# Patient Record
Sex: Male | Born: 1945 | Race: Black or African American | Hispanic: No | Marital: Married | Smoking: Former smoker
Health system: Southern US, Community
[De-identification: ages and names within clinical notes are randomized; demographics above are authoritative.]

## PROBLEM LIST (undated history)

## (undated) DIAGNOSIS — E119 Type 2 diabetes mellitus without complications: Secondary | ICD-10-CM

## (undated) DIAGNOSIS — K635 Polyp of colon: Secondary | ICD-10-CM

## (undated) DIAGNOSIS — K5730 Diverticulosis of large intestine without perforation or abscess without bleeding: Secondary | ICD-10-CM

## (undated) DIAGNOSIS — E669 Obesity, unspecified: Secondary | ICD-10-CM

## (undated) DIAGNOSIS — N529 Male erectile dysfunction, unspecified: Secondary | ICD-10-CM

## (undated) DIAGNOSIS — M1990 Unspecified osteoarthritis, unspecified site: Secondary | ICD-10-CM

## (undated) DIAGNOSIS — I4891 Unspecified atrial fibrillation: Secondary | ICD-10-CM

## (undated) DIAGNOSIS — I10 Essential (primary) hypertension: Secondary | ICD-10-CM

## (undated) DIAGNOSIS — E785 Hyperlipidemia, unspecified: Secondary | ICD-10-CM

## (undated) HISTORY — DX: Obesity, unspecified: E66.9

## (undated) HISTORY — DX: Diverticulosis of large intestine without perforation or abscess without bleeding: K57.30

## (undated) HISTORY — DX: Type 2 diabetes mellitus without complications: E11.9

## (undated) HISTORY — PX: PROSTATE BIOPSY: SHX241

## (undated) HISTORY — DX: Unspecified atrial fibrillation: I48.91

## (undated) HISTORY — DX: Male erectile dysfunction, unspecified: N52.9

## (undated) HISTORY — DX: Essential (primary) hypertension: I10

## (undated) HISTORY — DX: Polyp of colon: K63.5

## (undated) HISTORY — PX: HERNIA REPAIR: SHX51

## (undated) HISTORY — DX: Hyperlipidemia, unspecified: E78.5

---

## 2000-12-27 HISTORY — PX: OTHER SURGICAL HISTORY: SHX169

## 2002-12-27 HISTORY — PX: COLONOSCOPY: SHX174

## 2003-01-07 ENCOUNTER — Ambulatory Visit (HOSPITAL_COMMUNITY): Admission: RE | Admit: 2003-01-07 | Discharge: 2003-01-07 | Payer: Self-pay | Admitting: Internal Medicine

## 2003-01-09 ENCOUNTER — Ambulatory Visit (HOSPITAL_COMMUNITY): Admission: RE | Admit: 2003-01-09 | Discharge: 2003-01-09 | Payer: Self-pay | Admitting: Internal Medicine

## 2003-01-09 ENCOUNTER — Encounter: Payer: Self-pay | Admitting: Internal Medicine

## 2003-06-04 ENCOUNTER — Encounter: Payer: Self-pay | Admitting: *Deleted

## 2003-06-04 ENCOUNTER — Encounter (HOSPITAL_COMMUNITY): Admission: RE | Admit: 2003-06-04 | Discharge: 2003-07-04 | Payer: Self-pay | Admitting: *Deleted

## 2003-06-10 ENCOUNTER — Ambulatory Visit (HOSPITAL_COMMUNITY): Admission: RE | Admit: 2003-06-10 | Discharge: 2003-06-10 | Payer: Self-pay | Admitting: *Deleted

## 2003-06-18 ENCOUNTER — Ambulatory Visit (HOSPITAL_COMMUNITY): Admission: RE | Admit: 2003-06-18 | Discharge: 2003-06-18 | Payer: Self-pay | Admitting: Cardiology

## 2004-06-05 ENCOUNTER — Ambulatory Visit (HOSPITAL_COMMUNITY): Admission: RE | Admit: 2004-06-05 | Discharge: 2004-06-05 | Payer: Self-pay | Admitting: General Surgery

## 2005-02-19 ENCOUNTER — Ambulatory Visit: Payer: Self-pay | Admitting: *Deleted

## 2005-02-19 ENCOUNTER — Encounter: Payer: Self-pay | Admitting: Family Medicine

## 2008-10-21 ENCOUNTER — Ambulatory Visit: Payer: Self-pay | Admitting: Family Medicine

## 2008-10-21 DIAGNOSIS — M109 Gout, unspecified: Secondary | ICD-10-CM | POA: Insufficient documentation

## 2008-10-21 DIAGNOSIS — R7303 Prediabetes: Secondary | ICD-10-CM

## 2008-10-21 DIAGNOSIS — I10 Essential (primary) hypertension: Secondary | ICD-10-CM | POA: Insufficient documentation

## 2008-10-21 LAB — CONVERTED CEMR LAB
Glucose, Bld: 154 mg/dL
Hgb A1c MFr Bld: 6.2 %

## 2008-10-23 ENCOUNTER — Encounter: Payer: Self-pay | Admitting: Family Medicine

## 2008-10-25 LAB — CONVERTED CEMR LAB
ALT: 22 units/L (ref 0–53)
AST: 22 units/L (ref 0–37)
Albumin: 4.4 g/dL (ref 3.5–5.2)
Alkaline Phosphatase: 95 units/L (ref 39–117)
BUN: 13 mg/dL (ref 6–23)
Basophils Absolute: 0 10*3/uL (ref 0.0–0.1)
Basophils Relative: 0 % (ref 0–1)
Bilirubin, Direct: 0.1 mg/dL (ref 0.0–0.3)
CO2: 24 meq/L (ref 19–32)
Calcium: 9.8 mg/dL (ref 8.4–10.5)
Chloride: 105 meq/L (ref 96–112)
Cholesterol: 194 mg/dL (ref 0–200)
Creatinine, Ser: 1.21 mg/dL (ref 0.40–1.50)
Creatinine, Urine: 75.3 mg/dL
Eosinophils Absolute: 0.1 10*3/uL (ref 0.0–0.7)
Eosinophils Relative: 2 % (ref 0–5)
Glucose, Bld: 101 mg/dL — ABNORMAL HIGH (ref 70–99)
HCT: 42.9 % (ref 39.0–52.0)
HDL: 53 mg/dL (ref >39)
Hemoglobin: 14 g/dL (ref 13.0–17.0)
Indirect Bilirubin: 0.5 mg/dL (ref 0.0–0.9)
LDL Cholesterol: 121 mg/dL — ABNORMAL HIGH (ref 0–99)
Lymphocytes Relative: 25 % (ref 12–46)
Lymphs Abs: 1.8 10*3/uL (ref 0.7–4.0)
MCHC: 32.6 g/dL (ref 30.0–36.0)
MCV: 78.9 fL (ref 78.0–100.0)
Microalb Creat Ratio: 57.4 mg/g — ABNORMAL HIGH (ref 0.0–30.0)
Microalb, Ur: 4.32 mg/dL — ABNORMAL HIGH (ref 0.00–1.89)
Monocytes Absolute: 0.4 10*3/uL (ref 0.1–1.0)
Monocytes Relative: 6 % (ref 3–12)
Neutro Abs: 4.9 10*3/uL (ref 1.7–7.7)
Neutrophils Relative %: 68 % (ref 43–77)
PSA: 3.51 ng/mL (ref 0.10–4.00)
Platelets: 191 10*3/uL (ref 150–400)
Potassium: 4.7 meq/L (ref 3.5–5.3)
RBC: 5.44 M/uL (ref 4.22–5.81)
RDW: 15 % (ref 11.5–15.5)
Sodium: 140 meq/L (ref 135–145)
Total Bilirubin: 0.6 mg/dL (ref 0.3–1.2)
Total CHOL/HDL Ratio: 3.7
Total Protein: 7.4 g/dL (ref 6.0–8.3)
Triglycerides: 102 mg/dL (ref <150)
Uric Acid, Serum: 9 mg/dL — ABNORMAL HIGH (ref 4.0–7.8)
VLDL: 20 mg/dL (ref 0–40)
WBC: 7.2 10*3/uL (ref 4.0–10.5)

## 2009-04-14 ENCOUNTER — Emergency Department (HOSPITAL_COMMUNITY): Admission: EM | Admit: 2009-04-14 | Discharge: 2009-04-14 | Payer: Self-pay | Admitting: Emergency Medicine

## 2009-04-18 ENCOUNTER — Ambulatory Visit: Payer: Self-pay | Admitting: Family Medicine

## 2009-04-18 LAB — CONVERTED CEMR LAB
Blood Glucose, Fasting: 115 mg/dL
Hgb A1c MFr Bld: 6.3 %

## 2009-08-26 ENCOUNTER — Ambulatory Visit: Payer: Self-pay | Admitting: Family Medicine

## 2009-08-26 DIAGNOSIS — R5381 Other malaise: Secondary | ICD-10-CM | POA: Insufficient documentation

## 2009-08-26 DIAGNOSIS — R5383 Other fatigue: Secondary | ICD-10-CM

## 2009-08-26 DIAGNOSIS — E785 Hyperlipidemia, unspecified: Secondary | ICD-10-CM | POA: Insufficient documentation

## 2009-08-26 LAB — CONVERTED CEMR LAB
Glucose, Bld: 104 mg/dL
Hgb A1c MFr Bld: 6.5 %

## 2009-09-01 DIAGNOSIS — N529 Male erectile dysfunction, unspecified: Secondary | ICD-10-CM

## 2009-09-01 HISTORY — DX: Male erectile dysfunction, unspecified: N52.9

## 2009-12-08 ENCOUNTER — Encounter (INDEPENDENT_AMBULATORY_CARE_PROVIDER_SITE_OTHER): Payer: Self-pay | Admitting: *Deleted

## 2010-03-26 ENCOUNTER — Ambulatory Visit: Payer: Self-pay | Admitting: Family Medicine

## 2010-03-27 ENCOUNTER — Encounter: Payer: Self-pay | Admitting: Family Medicine

## 2010-03-31 ENCOUNTER — Telehealth: Payer: Self-pay | Admitting: Family Medicine

## 2010-04-01 ENCOUNTER — Encounter: Payer: Self-pay | Admitting: Family Medicine

## 2010-04-06 ENCOUNTER — Telehealth: Payer: Self-pay | Admitting: Family Medicine

## 2010-04-06 LAB — CONVERTED CEMR LAB
ALT: 19 units/L (ref 0–53)
AST: 15 units/L (ref 0–37)
BUN: 15 mg/dL (ref 6–23)
Basophils Absolute: 0 10*3/uL (ref 0.0–0.1)
Basophils Relative: 0 % (ref 0–1)
CO2: 26 meq/L (ref 19–32)
Calcium: 9.7 mg/dL (ref 8.4–10.5)
Chloride: 103 meq/L (ref 96–112)
Cholesterol: 198 mg/dL (ref 0–200)
Creatinine, Ser: 1.17 mg/dL (ref 0.40–1.50)
Eosinophils Absolute: 0.2 10*3/uL (ref 0.0–0.7)
Eosinophils Relative: 2 % (ref 0–5)
Glucose, Bld: 104 mg/dL — ABNORMAL HIGH (ref 70–99)
HCT: 40.4 % (ref 39.0–52.0)
HDL: 46 mg/dL (ref >39)
Hemoglobin: 13.3 g/dL (ref 13.0–17.0)
Hgb A1c MFr Bld: 6.3 % — ABNORMAL HIGH (ref 4.6–6.1)
LDL Cholesterol: 122 mg/dL — ABNORMAL HIGH (ref 0–99)
Lymphocytes Relative: 24 % (ref 12–46)
Lymphs Abs: 1.8 10*3/uL (ref 0.7–4.0)
MCHC: 32.9 g/dL (ref 30.0–36.0)
MCV: 78.3 fL (ref 78.0–100.0)
Monocytes Absolute: 0.4 10*3/uL (ref 0.1–1.0)
Monocytes Relative: 5 % (ref 3–12)
Neutro Abs: 5 10*3/uL (ref 1.7–7.7)
Neutrophils Relative %: 68 % (ref 43–77)
PSA: 5.99 ng/mL — ABNORMAL HIGH (ref 0.10–4.00)
Platelets: 229 10*3/uL (ref 150–400)
Potassium: 4.7 meq/L (ref 3.5–5.3)
RBC: 5.16 M/uL (ref 4.22–5.81)
RDW: 15 % (ref 11.5–15.5)
Sodium: 139 meq/L (ref 135–145)
Total CHOL/HDL Ratio: 4.3
Triglycerides: 152 mg/dL — ABNORMAL HIGH (ref <150)
Uric Acid, Serum: 6.7 mg/dL (ref 4.0–7.8)
VLDL: 30 mg/dL (ref 0–40)
WBC: 7.4 10*3/uL (ref 4.0–10.5)

## 2010-07-16 ENCOUNTER — Telehealth: Payer: Self-pay | Admitting: Family Medicine

## 2010-09-14 ENCOUNTER — Ambulatory Visit: Payer: Self-pay | Admitting: Family Medicine

## 2010-10-01 ENCOUNTER — Telehealth: Payer: Self-pay | Admitting: Family Medicine

## 2010-12-09 ENCOUNTER — Encounter: Payer: Self-pay | Admitting: Family Medicine

## 2010-12-11 ENCOUNTER — Telehealth: Payer: Self-pay | Admitting: Family Medicine

## 2010-12-17 ENCOUNTER — Telehealth: Payer: Self-pay | Admitting: Family Medicine

## 2011-01-26 NOTE — Progress Notes (Signed)
Summary: RESULTS  Phone Note Call from Patient   Summary of Call: CALL BACK AT CE[facility] # [id number] RESULTS  [facility] # [id number] Initial call taken by: Lind Guest,  April 06, 2010 9:17 AM  Follow-up for Phone Call        returned call, left message, both # Follow-up by: Adella Hare LPN,  April 06, 2010 9:34 AM  Additional Follow-up for Phone Call Additional follow up Details #1::        patient aware Additional Follow-up by: Adella Hare LPN,  April 06, 2010 9:57 AM

## 2011-01-26 NOTE — Letter (Signed)
Summary: Letter  Letter   Imported By: Lind Guest 04/01/2010 15:00:47  _____________________________________________________________________  External Attachment:    Type:   Image     Comment:   External Document

## 2011-01-26 NOTE — Progress Notes (Signed)
Summary: refill   Phone Note Call from Patient   Summary of Call: pt needs a refill on medicine and needs to speak with nurse [phone]  cell [phone] Initial call taken by: Rudene Anda,  July 16, 2010 10:15 AM  Follow-up for Phone Call        returned call, left message Follow-up by: Adella Hare LPN,  July 16, 2010 11:55 AM  Additional Follow-up for Phone Call Additional follow up Details #1::        wants refill of indomethacin states #15 will not be enough  [facility]    Additional Follow-up by: Adella Hare LPN,  July 16, 2010 11:58 AM    Additional Follow-up for Phone Call Additional follow up Details #2::    pls keep the directions the same and inc the3 to 30, thanks  Follow-up by: Syliva Overman MD,  July 16, 2010 12:00 PM  Additional Follow-up for Phone Call Additional follow up Details #3:: Details for Additional Follow-up Action Taken: rx sent, called patient, no answer Additional Follow-up by: Adella Hare LPN,  July 16, 2010 4:36 PM  Prescriptions: INDOMETHACIN 50 MG CAPS (INDOMETHACIN) one capsule three times daily x 2 days, then one twice daily for 2 days thn one daily for 5 days  #30 x 0   Entered by:   Adella Hare LPN   Authorized by:   Syliva Overman MD   Signed by:   Adella Hare LPN on 21/30/8657   Method used:   Electronically to        [facility]  [address] (retail)       [address]       [place], [place]  [address]       Ph: [phone]       Fax: [phone]   RxID:   [id number]

## 2011-01-26 NOTE — Progress Notes (Signed)
Summary: referral  Phone Note Other Incoming   Caller: dr Pharoah Goggins Summary of Call: pt needs to be refered to be referred to urology eval and treat elevated PSA asap, pls fax the psa report just obtained last week, this is very impt Initial call taken by: Syliva Overman MD,  March 31, 2010 12:10 AM  Follow-up for Phone Call        pt has appt at dr. Jerre Simon office for 04/15/2010 3:00. pt number has been disconnected will send pt letter Follow-up by: Rudene Anda,  April 01, 2010 1:52 PM

## 2011-01-26 NOTE — Assessment & Plan Note (Signed)
Summary: per dr diabetic   Vital Signs:  Patient profile:   65 year old male Height:      74 inches Weight:      273 pounds BMI:     35.18 Pulse rate:   94 / minute Pulse rhythm:   regular Resp:     16 per minute BP sitting:   130 / 74  (left arm) Cuff size:   large  Vitals Entered By: Everitt Amber LPN (September 14, 2010 10:55 AM)  Nutrition Counseling: Patient's BMI is greater than 25 and therefore counseled on weight management options. CC: FOLLOW UP CHRONIC PROBLEMS    CC:  FOLLOW UP CHRONIC PROBLEMS .  History of Present Illness: Reports  thathe has been  doing well. Denies recent fever or chills. Denies sinus pressure, nasal congestion , ear pain or sore throat. Denies chest congestion, or cough productive of sputum. Denies chest pain, palpitations, PND, orthopnea or leg swelling. Denies abdominal pain, nausea, vomitting, diarrhea or constipation. Denies change in bowel movements or bloody stool. Denies dysuria , frequency, incontinence or hesitancy. Denies  joint pain, swelling, or reduced mobility. Denies headaches, vertigo, seizures. Denies depression, anxiety or insomnia. Denies  rash, lesions, or itch. He is not consistently taking his diabetic med, and is asymptomatic. [facility] is [facility]     Current Medications (verified): 1)  Allopurinol 300 Mg Tabs (Allopurinol) .... One Tab By Mouth Qd 2)  Metformin Hcl 500 Mg Tabs (Metformin Hcl) .... Take 1 Tablet By Mouth Once A Day 3)  Lotensin Hct 20-12.5 Mg Tabs (Benazepril-Hydrochlorothiazide) .... 2 Tablets Daily 4)  Levitra 20 Mg Tabs (Vardenafil Hcl) .... One Tab By Mouth Once Daily As Needed 30 Minutes Before Intercourse 5)  Lovastatin 40 Mg Tabs (Lovastatin) .... Take 1 Tab By Mouth At Bedtime 6)  Tamsulosin Hcl 0.4 Mg Caps (Tamsulosin Hcl) .... Take 1 Tablet By Mouth Once A Day  Allergies (verified): No Known Drug Allergies  Review of Systems      See HPI General:   Complains of fatigue. Eyes:  Denies blurring and discharge. Endo:  Denies excessive thirst and heat intolerance. Heme:  Denies abnormal bruising and bleeding. Allergy:  Denies hives or rash and itching eyes.  Physical Exam  General:  Well-developed,obese,in no acute distress; alert,appropriate and cooperative throughout examination HEENT: No facial asymmetry,  EOMI, No sinus tenderness, TM's Clear, oropharynx  pink and moist.   Chest: Clear to auscultation bilaterally.  CVS: S1, S2, No murmurs, No S3.   Abd: Soft, Nontender.  MS: Adequate ROM spine, hips, shoulders and knees.  Ext: No edema.   CNS: CN 2-12 intact, power tone and sensation normal throughout.   Skin: Intact, no visible lesions or rashes.  Psych: Good eye contact, normal affect.  Memory intact, not anxious or depressed appearing.    Impression & Recommendations:  Problem # 1:  OBESITY (ICD-278.00) Assessment Unchanged  Ht: 74 (09/14/2010)   Wt: 273 (09/14/2010)   BMI: 35.18 (09/14/2010) therapeutic lifestyle change discussed and encouraged  Problem # 2:  HYPERLIPIDEMIA (ICD-272.4) Assessment: Comment Only  His updated medication list for this problem includes:    Lovastatin 40 Mg Tabs (Lovastatin) .Marland Kitchen... Take 1 tab by mouth at bedtime Low fat diet discussed and encouraged, and literature also given   Labs Reviewed: SGOT: 15 (03/27/2010)   SGPT: 19 (03/27/2010)   HDL:46 (03/27/2010), 53 (10/23/2008)  LDL:122 (03/27/2010), 121 (10/23/2008)  Chol:198 (03/27/2010), 194 (10/23/2008)  Trig:152 (03/27/2010),  102 (10/23/2008)  Problem # 3:  DIABETES MELLITUS, TYPE II (ICD-250.00) Assessment: Comment Only  His updated medication list for this problem includes:    Metformin Hcl 500 Mg Tabs (Metformin hcl) .Marland Kitchen... Take 1 tablet by mouth once a day    Lotensin Hct 20-12.5 Mg Tabs (Benazepril-hydrochlorothiazide) .Marland Kitchen... 2 tablets daily Patient advised to reduce carbs and sweets, commit to regular physical activity, take  meds as prescribed, test blood sugars as directed, and attempt to lose weight , to improve blood sugar control.  Orders: T- Hemoglobin A1C ([id number]) T-Urine Microalbumin w/creat. ratio [phone])  Labs Reviewed: Creat: 1.17 (03/27/2010)    Reviewed HgBA1c results: 6.3 (03/27/2010)  6.5 (08/26/2009)  Complete Medication List: 1)  Allopurinol 300 Mg Tabs (Allopurinol) .... One tab by mouth qd 2)  Metformin Hcl 500 Mg Tabs (Metformin hcl) .... Take 1 tablet by mouth once a day 3)  Lotensin Hct 20-12.5 Mg Tabs (Benazepril-hydrochlorothiazide) .... 2 tablets daily 4)  Levitra 20 Mg Tabs (Vardenafil hcl) .... One tab by mouth once daily as needed 30 minutes before intercourse 5)  Lovastatin 40 Mg Tabs (Lovastatin) .... Take 1 tab by mouth at bedtime 6)  Tamsulosin Hcl 0.4 Mg Caps (Tamsulosin hcl) .... Take 1 tablet by mouth once a day  Other Orders: T-Basic Metabolic Panel [phone]) T-Hepatic Function [phone]) T-Lipid Profile [phone]) T-TSH [phone]) T-Uric Acid (Blood) [phone])  Patient Instructions: 1)  Follow up appointment in 5.36months 2)  It is important that you exercise regularly at least 20 minutes 5 times a week. If you develop chest pain, have severe difficulty breathing, or feel very tired , stop exercising immediately and seek medical attention. 3)  You need to lose weight. Consider a lower calorie diet and regular exercise.  4)  BMP prior to visit, ICD-9: 5)  Hepatic Panel prior to visit, ICD-9: 6)  Lipid Panel prior to visit, ICD-9: 7)  TSH prior to visit, ICD-9:   fasting at th [place] asap 8)  HbgA1C prior to visit, ICD-9: 9)  Urine Microalbumin prior to visit, ICD-9: 10)  Uric acid 11)  no med changes

## 2011-01-26 NOTE — Assessment & Plan Note (Signed)
Summary: OV   Vital Signs:  Patient profile:   65 year old male Height:      74 inches Weight:      274.25 pounds BMI:     35.34 O2 Sat:      98 % Pulse rate:   114 / minute Pulse rhythm:   regular Resp:     16 per minute BP sitting:   148 / 94  (left arm) Cuff size:   large  Vitals Entered By: Everitt Amber LPN (March 26, 2010 10:19 AM)  Nutrition Counseling: Patient's BMI is greater than 25 and therefore counseled on weight management options. CC: Follow up chronic problems   CC:  Follow up chronic problems.  History of Present Illness: Reports  that he ahs been doing well. Denies recent fever or chills. Denies sinus pressure, nasal congestion , ear pain or sore throat. Denies chest congestion, or cough productive of sputum. Denies chest pain, palpitations, PND, orthopnea or leg swelling. Denies abdominal pain, nausea, vomitting, diarrhea or constipation. Denies change in bowel movements or bloody stool. Denies dysuria , frequency, incontinence or hesitancy. . Denies headaches, vertigo, seizures. Denies depression, anxiety or insomnia. Denies  rash, lesions, or itch.     Current Medications (verified): 1)  Indomethacin 50 Mg Caps (Indomethacin) .... Take One Cap Every 8 Hours With Food 2)  Allopurinol 300 Mg Tabs (Allopurinol) .... One Tab By Mouth Qd 3)  Metformin Hcl 500 Mg Tabs (Metformin Hcl) .... Take 1 Tablet By Mouth Once A Day 4)  Lotensin Hct 20-12.5 Mg Tabs (Benazepril-Hydrochlorothiazide) .... 2 Tablets Daily 5)  Levitra 20 Mg Tabs (Vardenafil Hcl) .... One Tab By Mouth Once Daily As Needed 30 Minutes Before Intercourse  Allergies (verified): No Known Drug Allergies  Review of Systems      See HPI CV:  forgot BP med yesterday. MS:  Complains of joint pain and stiffness; left ankle then right heel gout flare in the past 1 wek still symptomatic, firast tiime in 6 months. Endo:  Denies cold intolerance, excessive hunger, excessive thirst, excessive  urination, heat intolerance, polyuria, and weight change; are tested daily bnblood sugars avg 134. Heme:  Denies abnormal bruising and bleeding. Allergy:  Complains of seasonal allergies.  Physical Exam  General:  Well-developed,obese,in no acute distress; alert,appropriate and cooperative throughout examination HEENT: No facial asymmetry,  EOMI, No sinus tenderness, TM's Clear, oropharynx  pink and moist.   Chest: Clear to auscultation bilaterally.  CVS: S1, S2, No murmurs, No S3.   Abd: Soft, Nontender.  MS: Adequate ROM spine, hips, shoulders and knees.  Ext: No edema.   CNS: CN 2-12 intact, power tone and sensation normal throughout.   Skin: Intact, no visible lesions or rashes.  Psych: Good eye contact, normal affect.  Memory intact, not anxious or depressed appearing.    Impression & Recommendations:  Problem # 1:  HYPERLIPIDEMIA (ICD-272.4) Assessment Comment Only  The following medications were removed from the medication list:    Lovastatin 40 Mg Tabs (Lovastatin) .Marland Kitchen... Take 1 tab by mouth at bedtime His updated medication list for this problem includes:    Lovastatin 40 Mg Tabs (Lovastatin) .Marland Kitchen... Take 1 tab by mouth at bedtime  Orders: T-Lipid Profile ([id number]) T-AST/SGOT ([id number]) T-ALT/SGPT [phone])  Labs Reviewed: SGOT: 22 (10/23/2008)   SGPT: 22 (10/23/2008)   HDL:53 (10/23/2008)  LDL:121 (10/23/2008)  Chol:194 (10/23/2008)  Trig:102 (10/23/2008)  Problem # 2:  DIABETES MELLITUS, TYPE II (ICD-250.00) Assessment: Comment Only  His updated medication list  for this problem includes:    Metformin Hcl 500 Mg Tabs (Metformin hcl) .Marland Kitchen... Take 1 tablet by mouth once a day    Lotensin Hct 20-12.5 Mg Tabs (Benazepril-hydrochlorothiazide) .Marland Kitchen... 2 tablets daily  Orders: T- Hemoglobin A1C ([id number])  Labs Reviewed: Creat: 1.21 (10/23/2008)    Reviewed HgBA1c results: 6.5 (08/26/2009)  6.3 (04/18/2009)  Problem # 3:  OBESITY  (ICD-278.00) Assessment: Unchanged  Ht: 74 (03/26/2010)   Wt: 274.25 (03/26/2010)   BMI: 35.34 (03/26/2010)  Problem # 4:  HYPERTENSION (ICD-401.9) Assessment: Unchanged  His updated medication list for this problem includes:    Lotensin Hct 20-12.5 Mg Tabs (Benazepril-hydrochlorothiazide) .Marland Kitchen... 2 tablets daily  Orders: T-Basic Metabolic Panel [phone])  BP today: 148/94 Prior BP: 140/94 (08/26/2009)  Labs Reviewed: K+: 4.7 (10/23/2008) Creat: : 1.21 (10/23/2008)   Chol: 194 (10/23/2008)   HDL: 53 (10/23/2008)   LDL: 121 (10/23/2008)   TG: 102 (10/23/2008)  Problem # 5:  GOUT, UNSPECIFIED (ICD-274.9) Assessment: Deteriorated  His updated medication list for this problem includes:    Allopurinol 300 Mg Tabs (Allopurinol) ..... One tab by mouth qd  Orders: T-Uric Acid (Blood) ([id number])  Elevate extremity; warm compresses, symptomatic relief and medication as directed.   Complete Medication List: 1)  Allopurinol 300 Mg Tabs (Allopurinol) .... One tab by mouth qd 2)  Metformin Hcl 500 Mg Tabs (Metformin hcl) .... Take 1 tablet by mouth once a day 3)  Lotensin Hct 20-12.5 Mg Tabs (Benazepril-hydrochlorothiazide) .... 2 tablets daily 4)  Levitra 20 Mg Tabs (Vardenafil hcl) .... One tab by mouth once daily as needed 30 minutes before intercourse 5)  Indomethacin 50 Mg Caps (Indomethacin) .... One capsule three times daily x 2 days, then one twice daily for 2 days thn one daily for 5 days 6)  Lovastatin 40 Mg Tabs (Lovastatin) .... Take 1 tab by mouth at bedtime  Other Orders: T-CBC w/Diff ([id number]) T-PSA ([id number])  Patient Instructions: 1)  F/U in 5 months. 2)  It is important that you exercise regularly at least 20 minutes 5 times a week. If you develop chest pain, have severe difficulty breathing, or feel very tired , stop exercising immediately and seek medical attention. 3)  You need to lose weight. Consider a lower calorie diet and regular exercise.  4)   Labs asap, these are past due  5)  Meds are sent in for gout Prescriptions: LOVASTATIN 40 MG TABS (LOVASTATIN) Take 1 tab by mouth at bedtime  #30 x 3   Entered by:   Everitt Amber LPN   Authorized by:   Syliva Overman MD   Signed by:   Everitt Amber LPN on 02/72/5366   Method used:   Electronically to        [facility]  [address] (retail)       [address]       [place], [place]  [address]       Ph: [phone]       Fax: [phone]   RxID:   [id number] LOTENSIN HCT 20-12.5 MG TABS (BENAZEPRIL-HYDROCHLOROTHIAZIDE) 2 tablets daily  #60 x 5   Entered by:   Everitt Amber LPN   Authorized by:   Syliva Overman MD   Signed by:   Everitt Amber LPN on 23/55/7322   Method used:   Electronically to        [facility]  [address] (retail)       [address]  [place], [place]  [address]       Ph: [phone]       Fax: [phone]   RxID:   [id number] METFORMIN HCL 500 MG TABS (METFORMIN HCL) Take 1 tablet by mouth once a day  #30 x 4   Entered by:   Everitt Amber LPN   Authorized by:   Syliva Overman MD   Signed by:   Everitt Amber LPN on 40/09/2724   Method used:   Electronically to        [facility]  [address] (retail)       [address]       [place], [place]  [address]       Ph: [phone]       Fax: [phone]   RxID:   [id number] ALLOPURINOL 300 MG TABS (ALLOPURINOL) ONE TAB by mouth QD  #30 x 4   Entered by:   Everitt Amber LPN   Authorized by:   Syliva Overman MD   Signed by:   Everitt Amber LPN on 09/32/3557   Method used:   Electronically to        [facility]  [address] (retail)       [address]       [place], [place]  [address]       Ph: [phone]       Fax: [phone]   RxID:   [id number] INDOMETHACIN 50 MG CAPS (INDOMETHACIN) one capsule three times daily x 2 days, then one twice daily for 2 days thn one daily for 5 days  #15 x 1   Entered and Authorized by:   Syliva Overman MD   Signed by:   Syliva Overman MD on 03/26/2010   Method used:   Electronically to        [facility]  [address] (retail)       [address]       [place], [place]  [address]       Ph: [phone]       Fax: [phone]   RxID:   [id number]

## 2011-01-26 NOTE — Progress Notes (Signed)
Summary: refills  Phone Note Call from Patient   Summary of Call: needs a refill on diabetic pills sent into [facility].  [phone] Initial call taken by: Rudene Anda,  October 01, 2010 11:17 AM    Prescriptions: METFORMIN HCL 500 MG TABS (METFORMIN HCL) Take 1 tablet by mouth once a day  #30 x 3   Entered by:   Everitt Amber LPN   Authorized by:   Syliva Overman MD   Signed by:   Everitt Amber LPN on 08/65/7846   Method used:   Electronically to        [facility] [phone]* (retail)       [address]       [place], [place]  [address]       Ph: [phone] or [phone]       Fax: [phone]   RxID:   [id number]

## 2011-01-28 NOTE — Progress Notes (Signed)
Summary: REFILL MEDICATION  Phone Note Call from Patient   Summary of Call: PATINET CLLED STATES THAT HE HAS LOST RX BOTTLE NEED REFILL ON BLOOD PRESSURE PILL PLEASE CALL INTO [facility]  Initial call taken by: Eugenio Hoes,  December 11, 2010 2:49 PM    Prescriptions: LOTENSIN HCT 20-12.5 MG TABS (BENAZEPRIL-HYDROCHLOROTHIAZIDE) 2 tablets daily  #60 x 2   Entered by:   Adella Hare LPN   Authorized by:   Syliva Overman MD   Signed by:   Adella Hare LPN on 16/09/9603   Method used:   Electronically to        [facility]  [address] (retail)       [address]       [place], [place]  [address]       Ph: [phone]       Fax: [phone]   RxID:   [id number]

## 2011-01-28 NOTE — Progress Notes (Signed)
Summary: RX REFILL   Phone Note Call from Patient   Summary of Call: PATIENT CALLED STATS THAT HE IS  OUT OF REFILL ON RX INDOMETHACIN 50 MG PHARMACY [facility] Initial call taken by: Eugenio Hoes,  December 17, 2010 9:45 AM  Follow-up for Phone Call        may i refill? Follow-up by: Adella Hare LPN,  December 17, 2010 9:56 AM  Additional Follow-up for Phone Call Additional follow up Details #1::        refillx 2 pls Additional Follow-up by: Syliva Overman MD,  December 17, 2010 11:25 AM    New/Updated Medications: INDOMETHACIN 50 MG CAPS (INDOMETHACIN) one capsule three times daily x 2 days, then one twice daily for 2 days thn one daily for 5 days Prescriptions: INDOMETHACIN 50 MG CAPS (INDOMETHACIN) one capsule three times daily x 2 days, then one twice daily for 2 days thn one daily for 5 days  #15 x 1   Entered by:   Adella Hare LPN   Authorized by:   Syliva Overman MD   Signed by:   Adella Hare LPN on 16/09/9603   Method used:   Electronically to        [facility]  [address] (retail)       [address]       [place], [place]  [address]       Ph: [phone]       Fax: [phone]   RxID:   [id number]

## 2011-01-28 NOTE — Letter (Signed)
Summary: Letter to [facility] for appt  Letter to [facility] for appt   Imported By: Lind Guest 12/09/2010 13:49:02  _____________________________________________________________________  External Attachment:    Type:   Image     Comment:   External Document

## 2011-03-10 ENCOUNTER — Encounter: Payer: Self-pay | Admitting: Family Medicine

## 2011-03-10 ENCOUNTER — Ambulatory Visit: Payer: Self-pay | Admitting: Family Medicine

## 2011-03-16 NOTE — Letter (Signed)
Summary: 1ST NO SHOW LETTER  1ST NO SHOW LETTER   Imported By: Lind Guest 03/11/2011 10:26:03  _____________________________________________________________________  External Attachment:    Type:   Image     Comment:   External Document

## 2011-05-03 ENCOUNTER — Telehealth: Payer: Self-pay | Admitting: Family Medicine

## 2011-05-03 MED ORDER — BENAZEPRIL-HYDROCHLOROTHIAZIDE 20-12.5 MG PO TABS: ORAL_TABLET | ORAL | Status: DC

## 2011-05-03 NOTE — Telephone Encounter (Signed)
Refills sent as requested

## 2011-05-14 NOTE — Op Note (Signed)
NAME:  Carlos Leon, Carlos Leon                         ACCOUNT NO.:  [id number]   MEDICAL RECORD NO.:  [id number]                   PATIENT TYPE:  AMB   LOCATION:  DAY                                  FACILITY:  [facility]   PHYSICIAN:  R. Roetta Sessions, M.D.              DATE OF BIRTH:  March 19, 1946   DATE OF PROCEDURE:  01/07/2003  DATE OF DISCHARGE:                                 OPERATIVE REPORT   PROCEDURE:  Surveillance colonoscopy.   INDICATIONS FOR PROCEDURE:  The patient is a 65 year old gentleman referred  by Tesfaye D. Felecia Shelling, M.D. for surveillance colonoscopy.  Apparently had a  colonoscopy some 5-6 years ago by Dr. Katrinka Blazing.  Polyps were removed.  He was  told to return in two years but has put it off until now.  He is devoid of  any lower GI tract symptoms.  Colonoscopy is now being done for  surveillance.  _________________ discussed with Mr. Raper at length.  Potential risks, benefits, and alternatives have been reviewed ASAT.   PROCEDURE NOTE:  O2 saturations and blood pressure, pulse, respirations were  monitored throughout the entire procedure.   CONSCIOUS SEDATION:  Versed 4 mg IV, Demerol 100 mg IV in divided doses.   INSTRUMENT:  Olympus video adult colonoscope.   FINDINGS:  Digital rectal exam revealed no abnormalities.   ENDOSCOPIC FINDINGS:  The prep was poor as described.   RECTUM:  Examination of the rectal mucosa under retroflexed view of the anal  verge revealed no abnormalities.   COLON:  Colonic mucosa was surveyed from the rectosigmoid junction through  the left transverse and right colon, to the area of the appendiceal orifice,  ileocecal valve, and cecum.  These structures were photographed.  The prep,  unfortunately, was poor.  There was formed and semiformed stool along the  way and vegetable matter which kept clogging the scope.  It was not possible  to irrigate and suction all of this material away, and consequently all of  the colonic mucosa was not  seen.  Grossly, however, there were no obvious  colonic mucosal abnormalities.  From the level of the cecum and ileocecal  valve, the scope was slowly withdrawn.  All previously mentioned mucosal  surfaces were again seen and, again, no abnormalities were observed.  The  patient tolerated the procedure well, was reacted in endoscopy.   IMPRESSION:  1. Normal rectum.  2. Grossly normal colon.  3. Poor prep precluded complete examination of the colon.    RECOMMENDATIONS:  Will complement the patient's study with an air contrast  barium enema in the near future.  Further recommendations to follow.                                               Jonathon Bellows,  M.D.    RMR/MEDQ  D:  01/07/2003  T:  01/07/2003  Job:  [id number]

## 2011-05-14 NOTE — Op Note (Signed)
Carlos Leon, Carlos Leon                         ACCOUNT NO.:  [id number]   MEDICAL RECORD NO.:  [id number]                   PATIENT TYPE:  AMB   LOCATION:  DAY                                  FACILITY:  [facility]   PHYSICIAN:  Jerolyn Shin C. Katrinka Blazing, M.D.                DATE OF BIRTH:  28-Mar-1946   DATE OF PROCEDURE:  DATE OF DISCHARGE:                                 OPERATIVE REPORT   PREOPERATIVE DIAGNOSIS:  Right inguinal hernia.   POSTOPERATIVE DIAGNOSIS:  Right indirect inguinal hernia.   PROCEDURE:  Right inguinal hernia repair.   SURGEON:  Dirk Dress. Katrinka Blazing, M.D.   DESCRIPTION:  Under spinal anesthesia the right inguinal area was prepped  and draped in a sterile field.  A curvilinear right lower inguinal incision  was made.  The excision extended down to the aponeurosis of the external  oblique.  The aponeurosis was opened in the line of its fibers.  The cord  was mobilized.  There were 2 large lipomas of the cord.  They were dissected  back to the internal ring, ligated and divided.  There was a large sac which  had covering of fat.  This was dissected back to the internal ring. It was  invaginated.  The area was plugged with a large mesh graft plug.  The plug  was sutured in with interrupted #0 Prolene.  An [place] patch was placed and  it was sutured with a running #0 Prolene.   The patient tolerated the procedure well.  The cord was placed in anatomic  position and the aponeurosis was closed with 3-0 Biosyn.  The subcutaneous  tissue was closed with 3-0 Biosyn.  The skin was closed with subcuticular 4-  0 Dexon. The patient tolerated the procedure well.  Local infiltration with  0.5% Marcaine with epinephrine was carried out for a total of 30 cc in the  subcutaneous tissue.  Dressings were placed.  He was transferred to a bed;  and taken to the postanesthetic care unit for further monitoring.      ___________________________________________    Dirk Dress. Katrinka Blazing, M.D.   LCS/MEDQ  D:  06/05/2004  T:  06/05/2004  Job:  [id number]   cc:   Tesfaye D. Felecia Shelling, M.D.  [address]  [place]  [place] [address]  Fax: [phone]

## 2011-05-14 NOTE — H&P (Signed)
NAMEARIEZ, Carlos Leon                         ACCOUNT NO.:  [id number]   MEDICAL RECORD NO.:  [id number]                   PATIENT TYPE:  AMB   LOCATION:  DAY                                  FACILITY:  [facility]   PHYSICIAN:  Jerolyn Shin C. Katrinka Blazing, M.D.                DATE OF BIRTH:  [birth year]   DATE OF ADMISSION:  DATE OF DISCHARGE:                                HISTORY & PHYSICAL   HISTORY OF PRESENT ILLNESS:  [age]-year-old male with history of heart  catheterization in July 2004.  He states that his vessels were clear.  He  noted some swelling on the side of the catheterization site for about 5  weeks.  It became more prominent while playing golf.  He noted bulging when  he would cough.  The patient was evaluated and was noted to have right  inguinal hernia.  He is symptomatic and is scheduled for right inguinal  hernia repair.   PAST HISTORY:  He has hypertension and diabetes mellitus.   MEDICATIONS:  1. Toprol-XL 50 mg daily.  2. Glucophage 500 mg daily.  3. Zocor 20 mg nightly.   FAMILY HISTORY:  Family history is positive for hypertension and diabetes.   SOCIAL HISTORY:  He is married, employed, does not drink, smoke or use  drugs.   PHYSICAL EXAMINATION:  VITAL SIGNS:  On exam, blood pressure 160/88, pulse  64, respirations 18, weight 272 pounds.  HEENT:  Unremarkable.  NECK:  Neck is supple without JVD or bruit.  CHEST:  Chest clear to auscultation.  HEART:  Heart regular rate and rhythm without murmur, gallop or rub.  ABDOMEN:  Abdomen is soft and nontender.  No masses.  Large reducible right  inguinal hernia.  EXTREMITIES:  No cyanosis, clubbing or edema.   IMPRESSION:  1. Right inguinal hernia.  2. Hypertension.  3. Diabetes mellitus.  4. History of colon polyps.   PLAN:  Right inguinal hernia repair.  He will need to have a colonoscopy for  followup of his colon polyps before he returns to work.     ___________________________________________                               Dirk Dress. Katrinka Blazing, M.D.   LCS/MEDQ  D:  06/04/2004  T:  06/05/2004  Job:  [id number]

## 2011-05-14 NOTE — Group Therapy Note (Signed)
   NAME:  Phill, Steck.:  [id number]   MEDICAL RECORD NO.:  [id number]                   PATIENT TYPE:   LOCATION:                                       FACILITY:   PHYSICIAN:  Vida Roller, M.D.                DATE OF BIRTH:  Mar 14, 1946   DATE OF PROCEDURE:  06/04/2003  DATE OF DISCHARGE:                                    STRESS TEST   EXERCISE CARDIOLITE:   INDICATION:  Mr. Fellman is a 65 year old male with no known coronary  artery disease who presented with atypical chest discomfort.  His cardiac  risk factors include tobacco abuse, diabetes mellitus, hyperlipidemia, and  age.   BASELINE DATA:  EKG shows atrial fibrillation and a ventricular response  rate of 75 beats per minute.  Blood pressure is 152/90.   PROCEDURE:  The patient exercised for a total of 6 minutes and 29 seconds to  boost protocol 3 and 7.0 net.  The test was stopped secondary to an elevated  blood pressure at 210/110.  The patient stated that he felt like he could  have gone a lot longer.  He denied any cardiopulmonary symptoms.   EKG showed no ischemic changes and few PVCs.  Maximal heart rate achieved  was 196 beats per minute, which is 120% of predicted maximum.  Maximum blood  pressure was 210/110, which resolved down to 169/90 in recovery.   Final images and results are pending M.D. review.     Amy Mercy Riding, P.A. [facility]                     Vida Roller, M.D.    AB/MEDQ  D:  06/04/2003  T:  06/04/2003  Job:  [id number]

## 2011-05-14 NOTE — Cardiovascular Report (Signed)
NAMEPARRISH, Carlos Leon                         ACCOUNT NO.:  [id number]   MEDICAL RECORD NO.:  [id number]                   PATIENT TYPE:  OIB   LOCATION:  2899                                 FACILITY:  [facility]   PHYSICIAN:  Charlies Constable, M.D.                  DATE OF BIRTH:  02-15-1946   DATE OF PROCEDURE:  06/18/2003  DATE OF DISCHARGE:                              CARDIAC CATHETERIZATION   CLINICAL HISTORY:  The patient is 65 years old and has hypertension and  atrial fibrillation.  He was recently seen by Jesse Sans. Wall, M.D. with  chest pain that was thought to be somewhat atypical for ischemia.  He  arranged for him to have a Cardiolite scan which suggested inferior scar  with questionable ischemia and arranged for him to come in for evaluation  with angiography.   PROCEDURE:  The procedure was performed via the right femoral artery  utilizing an arterial sheath and 6-French preformed coronary catheters.  A  front wall arterial puncture was performed and Omnipaque contrast was used.  A distal aortogram was performed to rule out renovascular causes for  hypertension.  The right femoral artery was closed with Perclose at the end  of the procedure.  The patient tolerated the procedure well and left the  laboratory in satisfactory condition.   RESULTS:  The aortic pressure was 146/102 with a mean of 123 and left  ventricular pressure was 146/20.   Left main coronary artery:  Free of significant disease.   Left anterior descending artery:  Gave rise to a large diagonal branch and a  large septal perforator.  These and the LAD proper were free of significant  disease.   Circumflex artery:  The circumflex artery gave rise to large marginal branch  and a small posterolateral branch.  There was some irregularity in these  vessels but they were free of significant disease.   Right coronary artery:  The right coronary artery is a moderate-size vessel  that gave rise to a conus  branch, right ventricular branch, a posterior  descending branch, and a small posterolateral branch.  There was no  significant lesions in the right coronary artery.   LEFT VENTRICULOGRAM:  The left ventriculogram performed in the RAO  projection showed good wall motion with no areas of hypokinesis.  The  estimated ejection fraction was 55%.   DISTAL AORTOGRAM:  A distal aortogram was performed which showed patent  renal arteries and no significant aortoiliac obstruction.   CONCLUSION:  Non-obstructive coronary artery disease with minimal  irregularities in the left anterior descending and circumflex artery and  normal left ventricular function.   RECOMMENDATIONS:  Reassurance.  Will plan to resume the patient's Coumadin  which he is taking for intermediate embolic risk with atrial fibrillation  associated with hypertensive disease.  Charlies Constable, M.D.    BB/MEDQ  D:  06/18/2003  T:  06/18/2003  Job:  [id number]  Jesse Sans. Wall, M.D.   Tesfaye D. Felecia Shelling, M.D.  [address]  [place]  [place] [address]  Fax: [phone]   cc:   Thomas C. Wall, M.D.   Tesfaye D. Felecia Shelling, M.D.  [address]  [place]  [place] [address]  Fax: [phone]

## 2011-05-28 ENCOUNTER — Encounter: Payer: Self-pay | Admitting: Family Medicine

## 2011-06-04 ENCOUNTER — Encounter: Payer: Self-pay | Admitting: Family Medicine

## 2011-06-08 ENCOUNTER — Ambulatory Visit (INDEPENDENT_AMBULATORY_CARE_PROVIDER_SITE_OTHER): Payer: Self-pay | Admitting: Family Medicine

## 2011-06-08 ENCOUNTER — Encounter: Payer: Self-pay | Admitting: Family Medicine

## 2011-06-08 VITALS — BP 140/100 | HR 67 | Resp 16 | Ht 74.5 in | Wt 271.1 lb

## 2011-06-08 DIAGNOSIS — E785 Hyperlipidemia, unspecified: Secondary | ICD-10-CM

## 2011-06-08 DIAGNOSIS — R5381 Other malaise: Secondary | ICD-10-CM

## 2011-06-08 DIAGNOSIS — I10 Essential (primary) hypertension: Secondary | ICD-10-CM

## 2011-06-08 DIAGNOSIS — E119 Type 2 diabetes mellitus without complications: Secondary | ICD-10-CM

## 2011-06-08 DIAGNOSIS — E669 Obesity, unspecified: Secondary | ICD-10-CM

## 2011-06-08 DIAGNOSIS — R5383 Other fatigue: Secondary | ICD-10-CM

## 2011-06-08 DIAGNOSIS — N529 Male erectile dysfunction, unspecified: Secondary | ICD-10-CM

## 2011-06-08 MED ORDER — AMLODIPINE BESYLATE 5 MG PO TABS: 5.0000 mg | ORAL_TABLET | Freq: Every day | ORAL | Status: DC

## 2011-06-08 NOTE — Progress Notes (Signed)
  Subjective:    Patient ID: Carlos Leon, male    DOB: [birth year], 65 y.o.   MRN: [id number]  HPI HYPERTENSION Disease Monitoring Blood pressure range-120 to 130/80 Chest pain- no      Dyspnea- no Medications Compliance- good Lightheadedness- no   Edema- no   DIABETES Disease Monitoring Blood Sugar ranges-124 to 128 fasting Polyuria- no New Visual problems- no Medications Compliance- n/a Hypoglycemic symptoms- no   HYPERLIPIDEMIA Disease Monitoring See symptoms for Hypertension Medications Compliance- no meds at this time RUQ pain- no  Muscle aches- no       Review of Systems Denies recent fever or chills.c/o fatigue, but really reports "boredom" with retirement, though he does have some routine involving golf and volunteering with [employer] activities  Denies sinus pressure, nasal congestion, ear pain or sore throat. Denies chest congestion, productive cough or wheezing. Denies chest pains, palpitations, paroxysmal nocturnal dyspnea, orthopnea and leg swelling Denies abdominal pain, nausea, vomiting,diarrhea or constipation.  Denies rectal bleeding or change in bowel movement. Denies dysuria, frequency, hesitancy or incontinence. Denies joint pain, swelling and limitation in mobility. Denies headaches, seizure, numbness, or tingling. Denies depression, anxiety or insomnia. Denies skin break down or rash.        Objective:   Physical Exam Patient alert and oriented and in no Cardiopulmonary distress.  HEENT: No facial asymmetry, EOMI, no sinus tenderness, TM's clear, Oropharynx pink and moist.  Neck supple no adenopathy.  Chest: Clear to auscultation bilaterally.  CVS: S1, S2 no murmurs, no S3.  ABD: Soft non tender. Bowel sounds normal.  Ext: No edema  MS: Adequate ROM spine, shoulders, hips and knees.  Skin: Intact, no ulcerations or rash noted.  Psych: Good eye contact, normal affect. Memory intact not anxious or depressed appearing.  CNS: CN 2-12  intact, power, tone and sensation normal throughout.        Assessment & Plan:

## 2011-06-08 NOTE — Patient Instructions (Signed)
F/u in 3.5 months  Your BP  Is too high, new additional medication, amlodipine take this at bedtime , same time every night.  A healthy diet is rich in fruit, vegetables and whole grains. Poultry fish, nuts and beans are a healthy choice for protein rather then red meat. A low sodium diet and drinking 64 ounces of water daily is generally recommended. Oils and sweet should be limited. Carbohydrates especially for those who are diabetic or overweight, should be limited to 34-45 gram per meal. It is important to eat on a regular schedule, at least 3 times daily. Snacks should be primarily fruits, vegetables or nuts.  It is important that you exercise regularly at least 30 minutes 5 times a week. If you develop chest pain, have severe difficulty breathing, or feel very tired, stop exercising immediately and seek medical attention   Fasting labs today pls.  Consider part time job, also increased volunteer activity

## 2011-06-08 NOTE — Assessment & Plan Note (Signed)
Medication compliance addressed. Commitment to regular exercise and healthy  food choices, with portion control discussed. DASH diet and low fat diet discussed and literature offered. Changes in medication made at this visit.  

## 2011-06-09 NOTE — Assessment & Plan Note (Signed)
Low fat diet encouraged, needs rept labs

## 2011-06-09 NOTE — Assessment & Plan Note (Signed)
Improved slightly, pt encouraged to commit to changes his diet to include more veg and fruit, and reduced calories

## 2011-06-09 NOTE — Assessment & Plan Note (Signed)
Diet only. On no med currently by choice, need HBA1C, fasting sugars reportedly are in the 120' s , which is high

## 2011-07-23 ENCOUNTER — Other Ambulatory Visit: Payer: Self-pay | Admitting: Family Medicine

## 2011-07-28 ENCOUNTER — Encounter: Payer: Self-pay | Admitting: Family Medicine

## 2011-07-28 ENCOUNTER — Ambulatory Visit (INDEPENDENT_AMBULATORY_CARE_PROVIDER_SITE_OTHER): Payer: Self-pay | Admitting: Family Medicine

## 2011-07-28 DIAGNOSIS — E119 Type 2 diabetes mellitus without complications: Secondary | ICD-10-CM

## 2011-07-28 DIAGNOSIS — I10 Essential (primary) hypertension: Secondary | ICD-10-CM

## 2011-07-28 DIAGNOSIS — E669 Obesity, unspecified: Secondary | ICD-10-CM

## 2011-07-28 MED ORDER — ALLOPURINOL 300 MG PO TABS: 300.0000 mg | ORAL_TABLET | Freq: Every day | ORAL | Status: DC

## 2011-07-28 NOTE — Patient Instructions (Signed)
Welcome to medicare visit in 3 months.  Blood pressure is good, pls get labs  Asap.  All the best

## 2011-08-02 ENCOUNTER — Telehealth: Payer: Self-pay | Admitting: Family Medicine

## 2011-08-02 DIAGNOSIS — E119 Type 2 diabetes mellitus without complications: Secondary | ICD-10-CM

## 2011-08-04 NOTE — Telephone Encounter (Signed)
Do you have any paperwork ready for this patient?

## 2011-08-06 NOTE — Telephone Encounter (Signed)
Called patient and left message. Hba1c ordered and will give to patient when he comes for UA

## 2011-08-06 NOTE — Telephone Encounter (Signed)
Spoke with pt pls call him on his mobile he need CCUa, and pls order just a hBA1C needs that too. He canhave the ccua in here, form will be complete when i have both results. He is expecting a call from you this afternoon

## 2011-08-08 NOTE — Assessment & Plan Note (Signed)
Controlled, no change in medication  

## 2011-08-08 NOTE — Assessment & Plan Note (Signed)
Deteriorated. Patient re-educated about  the importance of commitment to a  minimum of 150 minutes of exercise per week. The importance of healthy food choices with portion control discussed. Encouraged to start a food diary, count calories and to consider  joining a support group. Sample diet sheets offered. Goals set by the patient for the next several months.    

## 2011-08-08 NOTE — Progress Notes (Signed)
  Subjective:    Patient ID: Carlos Leon, male    DOB: 1946/09/30, 65 y.o.   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 He has upcoming medicare in September, he has no complaints. The exam will be modified to facilitate the need at this time. He denies polyuria, polydipsia or blurred vision, he does not test his sugars regulalrly  Review of Systems Denies recent fever or chills. Denies sinus pressure, nasal congestion, ear pain or sore throat. Denies chest congestion, productive cough or wheezing. Denies chest pains, palpitations and leg swelling Denies abdominal pain, nausea, vomiting,diarrhea or constipation.   Denies dysuria, frequency, hesitancy or incontinence. Denies joint pain, swelling and limitation in mobility. Denies headaches, seizures, numbness, or tingling. Denies depression, anxiety or insomnia. Denies skin break down or rash.        Objective:   Physical Exam Patient alert and oriented and in no cardiopulmonary distress.  HEENT: No facial asymmetry, EOMI, no sinus tenderness,  oropharynx pink and moist.  Neck supple no adenopathy. Hears at approx a 5 foot dsitance a forced whisper Chest: Clear to auscultation bilaterally.  CVS: S1, S2 no murmurs, no S3.  ABD: Soft non tender. Bowel sounds normal.  Ext: No edema  MS: Adequate ROM spine, shoulders, hips and knees.  Skin: Intact, no ulcerations or rash noted.  Psych: Good eye contact, normal affect. Memory intact not anxious or depressed appearing.  CNS: CN 2-12 intact, power, tone and sensation normal throughout.        Assessment & Plan:

## 2011-08-08 NOTE — Assessment & Plan Note (Signed)
Needs updated hBA1C before form can be signed off, has been controlled in the past

## 2011-08-09 NOTE — Telephone Encounter (Signed)
Called patient again on home and mobile, left message

## 2011-08-10 DIAGNOSIS — N390 Urinary tract infection, site not specified: Secondary | ICD-10-CM

## 2011-08-10 LAB — POCT URINALYSIS DIPSTICK
Glucose, UA: NEGATIVE
Ketones, UA: NEGATIVE
Leukocytes, UA: NEGATIVE
Nitrite, UA: NEGATIVE
pH, UA: 7

## 2011-08-10 NOTE — Telephone Encounter (Signed)
Spoke with patient and he is coming in today to leave the urine sample and to do his labs. I told him after we get that information we can get the form done. [place] Spaniel he needs it by the 21st

## 2011-08-10 NOTE — Progress Notes (Signed)
Addended by: Abner Greenspan on: 08/10/2011 11:34 AM   Modules accepted: Orders

## 2011-08-11 LAB — HEMOGLOBIN A1C: Mean Plasma Glucose: 148 mg/dL — ABNORMAL HIGH (ref <117)

## 2011-08-11 MED ORDER — METFORMIN HCL 500 MG PO TABS: 500.0000 mg | ORAL_TABLET | Freq: Two times a day (BID) | ORAL | Status: DC

## 2011-08-11 NOTE — Telephone Encounter (Signed)
Patient aware of results and that paper will be ready this pm. Med change sent to [facility]

## 2011-10-14 ENCOUNTER — Telehealth: Payer: Self-pay | Admitting: Family Medicine

## 2011-10-15 ENCOUNTER — Other Ambulatory Visit: Payer: Self-pay | Admitting: Family Medicine

## 2011-10-15 DIAGNOSIS — K625 Hemorrhage of anus and rectum: Secondary | ICD-10-CM

## 2011-10-15 NOTE — Telephone Encounter (Signed)
First episode of rectal blood yesterday1 teaspoon in the commode, no associated pain,, states he had a colonscopy about 6 years ago, reports he had polyps,  Pt advised if he has excessive recurrent rectal blood needs to go to the ED. In the interim he is referred to GI

## 2011-10-15 NOTE — Telephone Encounter (Signed)
pls refer pt to GI evaluate painless rectal bleeding

## 2011-10-15 NOTE — Telephone Encounter (Signed)
Pt was referred to dr. Jeanella Flattery office. They will call pt with appt and time. Pt is aware

## 2011-10-26 ENCOUNTER — Other Ambulatory Visit: Payer: Self-pay | Admitting: Family Medicine

## 2011-10-27 ENCOUNTER — Ambulatory Visit (INDEPENDENT_AMBULATORY_CARE_PROVIDER_SITE_OTHER): Payer: Medicare Other | Admitting: Gastroenterology

## 2011-10-27 ENCOUNTER — Encounter: Payer: Self-pay | Admitting: Family Medicine

## 2011-10-27 ENCOUNTER — Encounter: Payer: Self-pay | Admitting: Gastroenterology

## 2011-10-27 VITALS — BP 147/89 | HR 82 | Temp 99.0°F | Ht 75.0 in | Wt 277.8 lb

## 2011-10-27 DIAGNOSIS — Z860100 Personal history of colon polyps, unspecified: Secondary | ICD-10-CM

## 2011-10-27 DIAGNOSIS — D126 Benign neoplasm of colon, unspecified: Secondary | ICD-10-CM | POA: Insufficient documentation

## 2011-10-27 DIAGNOSIS — K625 Hemorrhage of anus and rectum: Secondary | ICD-10-CM | POA: Insufficient documentation

## 2011-10-27 NOTE — Progress Notes (Signed)
Primary Care Physician:  Syliva Overman, MD, MD  Primary Gastroenterologist:  Roetta Sessions, MD   Chief Complaint  Patient presents with  . Rectal Bleeding    HPI:  Carlos Leon is a 65 y.o. male here for further evaluation of recent single episode of small volume painless hematachezia. Denies any abdominal pain or rectal pain associated with the bleeding. Generally his bowel movements occur daily. Occasionally he has mild constipation, takes prn saline laxative by mouth. No abdominal pain. He has a history of diverticulosis and colon polyps. Occasional heartburn. No dysphagia, n/v, weight loss.   Reviewed his old records. His last colonoscopy was in 2004 by Dr. Jena Gauss. His prep was poor. Complemented by an air-contrast barium enema, showed diverticulosis.  Current Outpatient Prescriptions  Medication Sig Dispense Refill  . allopurinol (ZYLOPRIM) 300 MG tablet Take 1 tablet (300 mg total) by mouth daily.  30 tablet  5  . amLODipine (NORVASC) 5 MG tablet Take 1 tablet (5 mg total) by mouth daily.  90 tablet  1  . benazepril-hydrochlorthiazide (LOTENSIN HCT) 20-12.5 MG per tablet Take 2 tablets by mouth daily.        . metFORMIN (GLUCOPHAGE) 500 MG tablet Take 1 tablet (500 mg total) by mouth 2 (two) times daily with a meal. New dose effective 08/11/11  60 tablet  5    Allergies as of 10/27/2011  . (No Known Allergies)    Past Medical History  Diagnosis Date  . Hypertension   . Diabetes mellitus   . Hyperlipidemia   . Gout   . Obesity   . Colon polyps   . Diverticulosis of colon     Past Surgical History  Procedure Date  . Hernia repair   . Colon polyps removed 2002    Dr. Katrinka Blazing  . Colonoscopy 2004    Dr. Heloise Beecham prep, ACBE showed diverticulosis    Family History  Problem Relation Age of Onset  . Hypertension Mother   . Cancer Mother     rectal, approximately 67  . Diabetes Father   . Liver disease Neg Hx     History   Social History  . Marital Status:  Married    Spouse Name: N/A    Number of Children: 4  . Years of Education: N/A   Occupational History  . retired    Social History Main Topics  . Smoking status: Never Smoker   . Smokeless tobacco: Not on file  . Alcohol Use: No  . Drug Use: No  . Sexually Active: Not on file   Other Topics Concern  . Not on file   Social History Narrative  . No narrative on file      ROS:  General: Negative for anorexia, weight loss, fever, chills, fatigue, weakness. Eyes: Negative for vision changes.  ENT: Negative for hoarseness, difficulty swallowing , nasal congestion. CV: Negative for chest pain, angina, palpitations, dyspnea on exertion, peripheral edema.  Respiratory: Negative for dyspnea at rest, dyspnea on exertion, cough, sputum, wheezing.  GI: See history of present illness. GU:  Negative for dysuria, hematuria, urinary incontinence, urinary frequency, nocturnal urination.  MS: Negative for joint pain, low back pain.  Derm: Negative for rash or itching.  Neuro: Negative for weakness, abnormal sensation, seizure, frequent headaches, memory loss, confusion.  Psych: Negative for anxiety, depression, suicidal ideation, hallucinations.  Endo: Negative for unusual weight change.  Heme: Negative for bruising or bleeding. Allergy: Negative for rash or hives.    Physical Examination:  BP 147/89  Pulse 82  Temp(Src) 99 F (37.2 C) (Temporal)  Ht 6\' 3"  (1.905 m)  Wt 277 lb 12.8 oz (126.009 kg)  BMI 34.72 kg/m2   General: Well-nourished, well-developed in no acute distress.  Head: Normocephalic, atraumatic.   Eyes: Conjunctiva pink, no icterus. Mouth: Oropharyngeal mucosa moist and pink , no lesions erythema or exudate. Neck: Supple without thyromegaly, masses, or lymphadenopathy.  Lungs: Clear to auscultation bilaterally.  Heart: Regular rate and rhythm, no murmurs rubs or gallops.  Abdomen: Bowel sounds are normal, nontender, nondistended, no hepatosplenomegaly or masses,  no abdominal bruits or hernia , no rebound or guarding.   Rectal: Deferred to time of colonoscopy. Extremities: No lower extremity edema. No clubbing or deformities.  Neuro: Alert and oriented x 4 , grossly normal neurologically.  Skin: Warm and dry, no rash or jaundice.   Psych: Alert and cooperative, normal mood and affect.  Labs: Lab Results  Component Value Date   WBC 7.4 03/27/2010   HGB 13.3 03/27/2010   HCT 40.4 03/27/2010   MCV 78.3 03/27/2010   PLT 229 03/27/2010     Imaging Studies: No results found.

## 2011-10-27 NOTE — Patient Instructions (Addendum)
We have scheduled you for a colonoscopy. Please see separate instructions. 

## 2011-10-27 NOTE — Assessment & Plan Note (Signed)
A single episode of painless rectal bleeding. Intermittent constipation. Patient has history of colon polyps, diverticulosis. Suspect benign anorectal bleeding however his last colonoscopy was in 2004, therefore recommend colonoscopy at this time for diagnostic purposes.  I have discussed the risks, alternatives, benefits with regards to but not limited to the risk of reaction to medication, bleeding, infection, perforation and the patient is agreeable to proceed. Written consent to be obtained.

## 2011-10-27 NOTE — Progress Notes (Signed)
Cc to PCP 

## 2011-10-27 NOTE — Progress Notes (Signed)
REVIEWED. AGREE. 

## 2011-11-01 ENCOUNTER — Ambulatory Visit: Payer: Self-pay | Admitting: Family Medicine

## 2011-11-02 ENCOUNTER — Encounter: Payer: Self-pay | Admitting: Family Medicine

## 2011-11-12 ENCOUNTER — Encounter (HOSPITAL_COMMUNITY): Payer: Self-pay

## 2011-11-12 ENCOUNTER — Encounter (HOSPITAL_COMMUNITY): Payer: Self-pay | Admitting: Pharmacy Technician

## 2011-11-12 ENCOUNTER — Emergency Department (HOSPITAL_COMMUNITY)
Admission: EM | Admit: 2011-11-12 | Discharge: 2011-11-12 | Disposition: A | Discharge status: Home / Self Care | Payer: Medicare Other | Attending: Emergency Medicine | Admitting: Emergency Medicine

## 2011-11-12 DIAGNOSIS — S93409A Sprain of unspecified ligament of unspecified ankle, initial encounter: Secondary | ICD-10-CM | POA: Insufficient documentation

## 2011-11-12 DIAGNOSIS — M159 Polyosteoarthritis, unspecified: Secondary | ICD-10-CM | POA: Insufficient documentation

## 2011-11-12 DIAGNOSIS — IMO0002 Reserved for concepts with insufficient information to code with codable children: Secondary | ICD-10-CM

## 2011-11-12 DIAGNOSIS — E785 Hyperlipidemia, unspecified: Secondary | ICD-10-CM | POA: Insufficient documentation

## 2011-11-12 DIAGNOSIS — Z860100 Personal history of colon polyps, unspecified: Secondary | ICD-10-CM | POA: Insufficient documentation

## 2011-11-12 DIAGNOSIS — M1990 Unspecified osteoarthritis, unspecified site: Secondary | ICD-10-CM

## 2011-11-12 DIAGNOSIS — I10 Essential (primary) hypertension: Secondary | ICD-10-CM | POA: Insufficient documentation

## 2011-11-12 DIAGNOSIS — X58XXXA Exposure to other specified factors, initial encounter: Secondary | ICD-10-CM | POA: Insufficient documentation

## 2011-11-12 DIAGNOSIS — K5730 Diverticulosis of large intestine without perforation or abscess without bleeding: Secondary | ICD-10-CM | POA: Insufficient documentation

## 2011-11-12 DIAGNOSIS — E119 Type 2 diabetes mellitus without complications: Secondary | ICD-10-CM | POA: Insufficient documentation

## 2011-11-12 MED ORDER — HYDROCODONE-ACETAMINOPHEN 7.5-325 MG PO TABS: 1.0000 | ORAL_TABLET | ORAL | Status: AC | PRN

## 2011-11-12 MED ORDER — MELOXICAM 7.5 MG PO TABS: ORAL_TABLET | ORAL | Status: DC

## 2011-11-12 NOTE — ED Provider Notes (Signed)
History     CSN: [id number] Arrival date & time: 11/12/2011  9:30 AM   First MD Initiated Contact with Patient 11/12/11 0930      Chief Complaint  Patient presents with  . Ankle Pain    (Consider location/radiation/quality/duration/timing/severity/associated sxs/prior treatment) HPI Comments: Pt states he has hx of djd of multiple sites. States he played basketball during his "younger years" and had repeated injury of the ankles. He has some pain about 4 to 5 days ago. He went golfing for about 4 to 5 hours and the next day had pain standing or attempting to walk. He is unsure of injuring the ankle during golf, but states it is possible he may have twisted the left ankle.  Patient is a 65 y.o. male presenting with ankle pain. The history is provided by the patient.  Ankle Pain  The incident occurred more than 2 days ago. Incident location: unknown. There was no injury mechanism. The pain is present in the left ankle. The quality of the pain is described as sharp and throbbing. The pain is moderate. The pain has been constant since onset. Associated symptoms include inability to bear weight. Pertinent negatives include no numbness, no loss of sensation and no tingling. He reports no foreign bodies present. The symptoms are aggravated by bearing weight. He has tried acetaminophen and rest for the symptoms. The treatment provided no relief.    Past Medical History  Diagnosis Date  . Hypertension   . Diabetes mellitus   . Hyperlipidemia   . Gout   . Obesity   . Colon polyps   . Diverticulosis of colon     Past Surgical History  Procedure Date  . Hernia repair   . Colon polyps removed 2002    Dr. Katrinka Blazing  . Colonoscopy 2004    Dr. Heloise Beecham prep, ACBE showed diverticulosis    Family History  Problem Relation Age of Onset  . Hypertension Mother   . Cancer Mother     rectal, approximately 56  . Diabetes Father   . Liver disease Neg Hx     History  Substance Use Topics    . Smoking status: Never Smoker   . Smokeless tobacco: Not on file  . Alcohol Use: No      Review of Systems  Constitutional: Negative for activity change.       All ROS Neg except as noted in HPI  HENT: Negative for nosebleeds and neck pain.   Eyes: Negative for photophobia and discharge.  Respiratory: Negative for cough, shortness of breath and wheezing.   Cardiovascular: Negative for chest pain and palpitations.  Gastrointestinal: Negative for abdominal pain and blood in stool.  Genitourinary: Negative for dysuria, frequency and hematuria.  Musculoskeletal: Positive for joint swelling and arthralgias. Negative for back pain.  Skin: Negative.   Neurological: Negative for dizziness, tingling, seizures, speech difficulty and numbness.  Psychiatric/Behavioral: Negative for hallucinations and confusion.    Allergies  Review of patient's allergies indicates no known allergies.  Home Medications   Current Outpatient Rx  Name Route Sig Dispense Refill  . ALLOPURINOL 300 MG PO TABS Oral Take 1 tablet (300 mg total) by mouth daily. 30 tablet 5  . AMLODIPINE BESYLATE 5 MG PO TABS Oral Take 1 tablet (5 mg total) by mouth daily. 90 tablet 1  . BENAZEPRIL-HYDROCHLOROTHIAZIDE 20-12.5 MG PO TABS Oral Take 2 tablets by mouth daily.      Marland Kitchen BENAZEPRIL-HYDROCHLOROTHIAZIDE 20-12.5 MG PO TABS  TAKE TWO TABLETS BY MOUTH  EVERY DAY 60 tablet 2  . HYDROCODONE-ACETAMINOPHEN 7.5-325 MG PO TABS Oral Take 1 tablet by mouth every 4 (four) hours as needed for pain. 20 tablet 0  . MELOXICAM 7.5 MG PO TABS  1 po bid with food 12 tablet 0  . METFORMIN HCL 500 MG PO TABS Oral Take 1 tablet (500 mg total) by mouth 2 (two) times daily with a meal. New dose effective 08/11/11 60 tablet 5    BP 137/93  Pulse 83  Temp(Src) 97.7 F (36.5 C) (Oral)  Resp 20  Ht 6\' 3"  (1.905 m)  Wt 274 lb (124.286 kg)  BMI 34.25 kg/m2  SpO2 99%  Physical Exam  Nursing note and vitals reviewed. Constitutional: He is oriented  to person, place, and time. He appears well-developed and well-nourished.  Non-toxic appearance.  HENT:  Head: Normocephalic.  Right Ear: Tympanic membrane and external ear normal.  Left Ear: Tympanic membrane and external ear normal.  Eyes: EOM and lids are normal. Pupils are equal, round, and reactive to light.  Neck: Normal range of motion. Neck supple. Carotid bruit is not present.  Cardiovascular: Normal rate, regular rhythm, normal heart sounds, intact distal pulses and normal pulses.   Pulmonary/Chest: Breath sounds normal. No respiratory distress.  Abdominal: Soft. Bowel sounds are normal. There is no tenderness. There is no guarding.  Musculoskeletal:       Degenerative changes and swelling of both ankles, left more than right. Pain with attempted ROM of the left lateral malleolus. Distal pulses and sensory wnl.  Lymphadenopathy:       Head (right side): No submandibular adenopathy present.       Head (left side): No submandibular adenopathy present.    He has no cervical adenopathy.  Neurological: He is alert and oriented to person, place, and time. He has normal strength. No cranial nerve deficit or sensory deficit.  Skin: Skin is warm and dry.  Psychiatric: He has a normal mood and affect. His speech is normal.    ED Course  Procedures (including critical care time)  Labs Reviewed - No data to display No results found.   1. Sprain of ankle or foot, left   2. DJD (degenerative joint disease)       MDM  I have reviewed nursing notes, vital signs, and all appropriate lab and imaging results for this patient.        Kathie Dike, PA 11/12/11 1015  Kathie Dike, [place] 11/12/11 1018

## 2011-11-12 NOTE — ED Provider Notes (Signed)
Medical screening examination/treatment/procedure(s) were performed by non-physician practitioner and as supervising physician I was immediately available for consultation/collaboration.   Berlin Mokry, MD 11/12/11 1432 

## 2011-11-12 NOTE — ED Notes (Signed)
Complain of pain and swelling in left ankle

## 2011-11-12 NOTE — ED Notes (Signed)
Pt c/o pain and swelling to his left ankle. Strong pedal pulse palpated. Able to move ankle but causes pain. Alert and oriented x 3. Skin warm and dry. Color pink.

## 2011-11-22 ENCOUNTER — Encounter (HOSPITAL_COMMUNITY): Payer: Self-pay | Admitting: Pharmacy Technician

## 2011-11-23 MED ORDER — SODIUM CHLORIDE 0.45 % IV SOLN
Freq: Once | INTRAVENOUS | Status: AC
  Administered 2011-11-24: 08:00:00 via INTRAVENOUS

## 2011-11-24 ENCOUNTER — Other Ambulatory Visit: Payer: Self-pay | Admitting: Internal Medicine

## 2011-11-24 ENCOUNTER — Ambulatory Visit (HOSPITAL_COMMUNITY)
Admission: RE | Admit: 2011-11-24 | Discharge: 2011-11-24 | Disposition: A | Discharge status: Home / Self Care | Payer: Medicare Other | Source: Ambulatory Visit | Attending: Internal Medicine | Admitting: Internal Medicine

## 2011-11-24 ENCOUNTER — Encounter (HOSPITAL_COMMUNITY)
Admission: RE | Disposition: A | Discharge status: Home / Self Care | Payer: Self-pay | Source: Ambulatory Visit | Attending: Internal Medicine

## 2011-11-24 ENCOUNTER — Encounter (HOSPITAL_COMMUNITY): Payer: Self-pay | Admitting: *Deleted

## 2011-11-24 DIAGNOSIS — D126 Benign neoplasm of colon, unspecified: Secondary | ICD-10-CM

## 2011-11-24 DIAGNOSIS — Z86010 Personal history of colonic polyps: Secondary | ICD-10-CM

## 2011-11-24 DIAGNOSIS — Z79899 Other long term (current) drug therapy: Secondary | ICD-10-CM | POA: Insufficient documentation

## 2011-11-24 DIAGNOSIS — K921 Melena: Secondary | ICD-10-CM

## 2011-11-24 DIAGNOSIS — K5909 Other constipation: Secondary | ICD-10-CM | POA: Insufficient documentation

## 2011-11-24 DIAGNOSIS — K625 Hemorrhage of anus and rectum: Secondary | ICD-10-CM

## 2011-11-24 DIAGNOSIS — I10 Essential (primary) hypertension: Secondary | ICD-10-CM | POA: Insufficient documentation

## 2011-11-24 DIAGNOSIS — Z01812 Encounter for preprocedural laboratory examination: Secondary | ICD-10-CM | POA: Insufficient documentation

## 2011-11-24 DIAGNOSIS — E785 Hyperlipidemia, unspecified: Secondary | ICD-10-CM | POA: Insufficient documentation

## 2011-11-24 DIAGNOSIS — E119 Type 2 diabetes mellitus without complications: Secondary | ICD-10-CM | POA: Insufficient documentation

## 2011-11-24 DIAGNOSIS — K5900 Constipation, unspecified: Secondary | ICD-10-CM

## 2011-11-24 HISTORY — DX: Unspecified osteoarthritis, unspecified site: M19.90

## 2011-11-24 HISTORY — PX: COLONOSCOPY: SHX5424

## 2011-11-24 SURGERY — COLONOSCOPY
Anesthesia: Moderate Sedation

## 2011-11-24 MED ORDER — MEPERIDINE HCL 100 MG/ML IJ SOLN
INTRAMUSCULAR | Status: AC
  Filled 2011-11-24: qty 2

## 2011-11-24 MED ORDER — MEPERIDINE HCL 100 MG/ML IJ SOLN
INTRAMUSCULAR | Status: DC | PRN
  Administered 2011-11-24: 25 mg via INTRAVENOUS
  Administered 2011-11-24: 50 mg via INTRAVENOUS

## 2011-11-24 MED ORDER — MIDAZOLAM HCL 5 MG/5ML IJ SOLN
INTRAMUSCULAR | Status: AC
  Filled 2011-11-24: qty 10

## 2011-11-24 MED ORDER — MIDAZOLAM HCL 5 MG/5ML IJ SOLN
INTRAMUSCULAR | Status: DC | PRN
  Administered 2011-11-24: 1 mg via INTRAVENOUS
  Administered 2011-11-24 (×2): 2 mg via INTRAVENOUS

## 2011-11-24 NOTE — H&P (Signed)
  I have seen & examined the patient prior to the procedure(s) today and reviewed the history and physical/consultation.  There have been no changes.  After consideration of the risks, benefits, alternatives and imponderables, the patient has consented to the procedure(s).   

## 2011-11-25 ENCOUNTER — Telehealth: Payer: Self-pay | Admitting: Family Medicine

## 2011-11-25 ENCOUNTER — Other Ambulatory Visit: Payer: Self-pay | Admitting: Family Medicine

## 2011-11-25 MED ORDER — ALLOPURINOL 300 MG PO TABS: 300.0000 mg | ORAL_TABLET | Freq: Every day | ORAL | Status: DC

## 2011-11-25 MED ORDER — INDOMETHACIN 50 MG PO CAPS: ORAL_CAPSULE | ORAL | Status: DC

## 2011-11-25 NOTE — Telephone Encounter (Signed)
Patient aware.

## 2011-11-25 NOTE — Telephone Encounter (Signed)
Allopurinol sent. Wants RX for Indomethacin

## 2011-11-25 NOTE — Telephone Encounter (Signed)
Indomethacin has been sent in, let him know pls. He is not to take naproxen when on this

## 2011-11-26 ENCOUNTER — Encounter: Payer: Self-pay | Admitting: Internal Medicine

## 2011-12-02 ENCOUNTER — Encounter: Payer: Self-pay | Admitting: Internal Medicine

## 2011-12-06 ENCOUNTER — Encounter (HOSPITAL_COMMUNITY): Payer: Self-pay | Admitting: Internal Medicine

## 2012-01-12 ENCOUNTER — Telehealth: Payer: Self-pay | Admitting: Family Medicine

## 2012-01-12 NOTE — Telephone Encounter (Signed)
Pt is asking for a rx for indocin. Do you want to see him in the office first?

## 2012-01-12 NOTE — Telephone Encounter (Signed)
Needs an ov, hasn't been for months and missed lastr sched visit Will need hBa1C and chem 7 before visit

## 2012-01-12 NOTE — Telephone Encounter (Signed)
Also needs fasting lipid , cmp and EGFR with the labs

## 2012-01-13 ENCOUNTER — Telehealth: Payer: Self-pay | Admitting: Family Medicine

## 2012-01-13 NOTE — Telephone Encounter (Signed)
Spoke with pt and he stated that he does not want to come in for an office visit and that he would use some other type of pain medicine.

## 2012-01-13 NOTE — Telephone Encounter (Signed)
Spoke with pt and he is not willing to come in for an appointment.

## 2012-01-14 NOTE — Telephone Encounter (Signed)
Noted  I attempted to directly spk with the pt to explain why I had requested he come in msg machine only, no message left

## 2012-01-14 NOTE — Telephone Encounter (Signed)
noted 

## 2012-01-31 ENCOUNTER — Telehealth: Payer: Self-pay | Admitting: Family Medicine

## 2012-02-02 ENCOUNTER — Other Ambulatory Visit: Payer: Self-pay

## 2012-02-02 DIAGNOSIS — E119 Type 2 diabetes mellitus without complications: Secondary | ICD-10-CM

## 2012-02-02 DIAGNOSIS — E785 Hyperlipidemia, unspecified: Secondary | ICD-10-CM

## 2012-02-02 DIAGNOSIS — M109 Gout, unspecified: Secondary | ICD-10-CM

## 2012-02-02 DIAGNOSIS — I10 Essential (primary) hypertension: Secondary | ICD-10-CM

## 2012-02-02 NOTE — Telephone Encounter (Signed)
Spoke with pt and he is going to come by and pick up lab papers and make a sooner appointment.

## 2012-02-02 NOTE — Telephone Encounter (Signed)
Spoke with pt and he will come by and pick up lab paper and make a sooner appointment

## 2012-02-03 ENCOUNTER — Ambulatory Visit (INDEPENDENT_AMBULATORY_CARE_PROVIDER_SITE_OTHER): Payer: Medicare Other | Admitting: Orthopedic Surgery

## 2012-02-03 ENCOUNTER — Other Ambulatory Visit: Payer: Self-pay | Admitting: Family Medicine

## 2012-02-03 ENCOUNTER — Encounter: Payer: Self-pay | Admitting: Orthopedic Surgery

## 2012-02-03 VITALS — BP 160/90 | Ht 74.0 in | Wt 267.0 lb

## 2012-02-03 DIAGNOSIS — S93409A Sprain of unspecified ligament of unspecified ankle, initial encounter: Secondary | ICD-10-CM

## 2012-02-03 DIAGNOSIS — M25579 Pain in unspecified ankle and joints of unspecified foot: Secondary | ICD-10-CM

## 2012-02-03 DIAGNOSIS — M6580 Other synovitis and tenosynovitis, unspecified site: Secondary | ICD-10-CM

## 2012-02-03 DIAGNOSIS — M659 Synovitis and tenosynovitis, unspecified: Secondary | ICD-10-CM

## 2012-02-03 DIAGNOSIS — S93401A Sprain of unspecified ligament of right ankle, initial encounter: Secondary | ICD-10-CM

## 2012-02-03 DIAGNOSIS — M7989 Other specified soft tissue disorders: Secondary | ICD-10-CM

## 2012-02-03 NOTE — Patient Instructions (Signed)
Wear brace for 6 weeks  Continue anti-inflammatory ibuprofen.  Ice and elevation ankle when not ambulating to apply ice 3 times a day for 20-30 minutes.

## 2012-02-03 NOTE — Progress Notes (Signed)
Patient ID: Carlos Leon, male   DOB: 1946-11-25, 66 y.o.   MRN: [id number] X-ray reports  3 views LEFT ankle.  The ankle joint itself looks normal.  There are some spurs in fragmented pieces of bone consistent with a diabetic neuropathy-type pattern although it is very early  Impression normal ankle note there is some soft tissue swelling laterally  2 views LEFT foot there are some mild degenerative changes in the tarsometatarsal and intermetatarsal joints  Impression mild degenerative changes tarsometatarsal joints and intermetatarsal joints.   Subjective:    Carlos Leon is a 66 y.o. male  Presents with 12 weeks swelling of the LEFT ankle occasionally RIGHT ankle as well.  Patient thinks he may have twisted his ankle playing golf.  Since that time he's had severe pain swelling associated with a limp partially relieved by ibuprofen and is also using a cane  Review of Systems - Negative except Heartburn and his eyes were correct times other symptoms are related to the joint fluid and ankle.  Objective:    BP 160/90  Ht 6\' 2"  (1.88 m)  Wt 267 lb (121.11 kg)  BMI 34.28 kg/m2  Physical Exam(12) GENERAL: normal development   CDV: pulses are normal   Skin: normal  Lymph: nodes were not palpable/normal  Psychiatric: awake, alert and oriented  Neuro: normal sensation  Ambulation as supported by a cane and there is a mild limp favoring the LEFT lower extremity.  Right ankle:   negative findings: no erythema, no ecchymosis, no tenderness, no effusion and no ligamentous laxity  Left ankle:   there is significant swelling around the lateral malleolus and tenderness over the posterior tibial tendons there is also tenderness over the anterior talofibular ligament.  Range of motion is limited seems to be chronic and not secondary to pain.  There is also mild weakness in eversion.  Ankle is stable.   Imaging: X-ray as noted in report Assessment:    differential diagnosis ankle  pain, ankle sprain, peroneal tenosynovitis, diabetic neuropathy.    Plan:    continue ibuprofen Cam Walker 6 weeks, ice elevation, return in 6 weeks

## 2012-02-04 LAB — COMPLETE METABOLIC PANEL WITH GFR
Albumin: 4.2 g/dL (ref 3.5–5.2)
Alkaline Phosphatase: 89 U/L (ref 39–117)
BUN: 12 mg/dL (ref 6–23)
GFR, Est Non African American: 68 mL/min
Glucose, Bld: 100 mg/dL — ABNORMAL HIGH (ref 70–99)
Total Bilirubin: 0.5 mg/dL (ref 0.3–1.2)

## 2012-02-04 LAB — HEMOGLOBIN A1C: Mean Plasma Glucose: 134 mg/dL — ABNORMAL HIGH (ref <117)

## 2012-02-04 LAB — LIPID PANEL
Cholesterol: 181 mg/dL (ref 0–200)
HDL: 48 mg/dL (ref >39)
Total CHOL/HDL Ratio: 3.8 Ratio

## 2012-02-07 ENCOUNTER — Other Ambulatory Visit: Payer: Self-pay | Admitting: Family Medicine

## 2012-02-07 ENCOUNTER — Encounter: Payer: Self-pay | Admitting: Family Medicine

## 2012-02-07 ENCOUNTER — Ambulatory Visit (INDEPENDENT_AMBULATORY_CARE_PROVIDER_SITE_OTHER): Payer: Medicare Other | Admitting: Family Medicine

## 2012-02-07 VITALS — BP 140/90 | HR 81 | Resp 16 | Ht 75.0 in | Wt 268.8 lb

## 2012-02-07 DIAGNOSIS — Z23 Encounter for immunization: Secondary | ICD-10-CM

## 2012-02-07 DIAGNOSIS — I4891 Unspecified atrial fibrillation: Secondary | ICD-10-CM | POA: Insufficient documentation

## 2012-02-07 DIAGNOSIS — E669 Obesity, unspecified: Secondary | ICD-10-CM

## 2012-02-07 DIAGNOSIS — R972 Elevated prostate specific antigen [PSA]: Secondary | ICD-10-CM

## 2012-02-07 DIAGNOSIS — I10 Essential (primary) hypertension: Secondary | ICD-10-CM

## 2012-02-07 DIAGNOSIS — E119 Type 2 diabetes mellitus without complications: Secondary | ICD-10-CM

## 2012-02-07 DIAGNOSIS — E785 Hyperlipidemia, unspecified: Secondary | ICD-10-CM

## 2012-02-07 DIAGNOSIS — I4892 Unspecified atrial flutter: Secondary | ICD-10-CM

## 2012-02-07 LAB — PSA: PSA: 7 ng/mL — ABNORMAL HIGH (ref <=4.00)

## 2012-02-07 MED ORDER — AMLODIPINE BESYLATE 2.5 MG PO TABS: 2.5000 mg | ORAL_TABLET | Freq: Every day | ORAL | Status: DC

## 2012-02-07 MED ORDER — PRAVASTATIN SODIUM 20 MG PO TABS: 20.0000 mg | ORAL_TABLET | Freq: Every evening | ORAL | Status: DC

## 2012-02-07 NOTE — Progress Notes (Signed)
  Subjective:    Patient ID: Carlos Leon, male    DOB: 1946/06/16, 66 y.o.   MRN: [id number]  HPI The PT is here for follow up and re-evaluation of chronic medical conditions, medication management and review of any available recent lab and radiology data.  Preventive health is updated, specifically  Cancer screening and Immunization.   Questions or concerns regarding consultations or procedures which the PT has had in the interim are  addressed. The PT denies any adverse reactions to current medications since the last visit.  Reports that  he sprained a tendon in the left ankle in the Summer , saw ortho last week and is wearing a boot for the next approx 6 weeks. H/o a fib, stopped coumadin on his own      Review of Systems See HPI Denies recent fever or chills. Denies sinus pressure, nasal congestion, ear pain or sore throat. Denies chest congestion, productive cough or wheezing. Denies chest pains, palpitations and leg swelling Denies abdominal pain, nausea, vomiting,diarrhea or constipation.   Denies dysuria, frequency, hesitancy or incontinence. Denies headaches, seizures, numbness, or tingling. Denies depression, anxiety or insomnia. Denies skin break down or rash.        Objective:   Physical Exam  Patient alert and oriented and in no cardiopulmonary distress.  HEENT: No facial asymmetry, EOMI, no sinus tenderness,  oropharynx pink and moist.  Neck supple no adenopathy.  Chest: Clear to auscultation bilaterally. EKG: atrial flutter with LVH CVS: S1, S2 no murmurs, no S3.  ABD: Soft non tender. Bowel sounds normal.  Ext: No edema  MS: Adequate ROM spine, shoulders, hips and knees.Left foot in boot  Skin: Intact, no ulcerations or rash noted.  Psych: Good eye contact, normal affect. Memory intact not anxious or depressed appearing.  CNS: CN 2-12 intact, power, tone and sensation normal throughout.       Assessment & Plan:

## 2012-02-07 NOTE — Assessment & Plan Note (Signed)
Uncontrolled, add amlodipine 2.5mg one daily 

## 2012-02-07 NOTE — Assessment & Plan Note (Signed)
Controlled, no change in medication  

## 2012-02-07 NOTE — Assessment & Plan Note (Signed)
Need to start pravastatin, low fat diet discussed and encouraged also

## 2012-02-07 NOTE — Patient Instructions (Addendum)
F/u in 5 month  New medication in addition to what you are taking for blood pressure, amlodipine , take one at bedtime , the same time you will start taking your medication for cholesterol which is also new, pravastatin  Congrats on weight loss, keep it up!  TdAP and pneumonia vaccine today.  Please call your ins company about coverage for the shingles vaccine  Fasting lipid cmp and EGFR and HBa1C in 5.5 month  You are referred for eye exam also to cardiology

## 2012-02-07 NOTE — Assessment & Plan Note (Signed)
Improved. Pt applauded on succesful weight loss through lifestyle change, and encouraged to continue same. Weight loss goal set for the next several months.  

## 2012-02-10 ENCOUNTER — Ambulatory Visit: Payer: Medicare Other | Admitting: Cardiology

## 2012-02-21 ENCOUNTER — Telehealth: Payer: Self-pay | Admitting: Family Medicine

## 2012-02-21 ENCOUNTER — Other Ambulatory Visit: Payer: Self-pay | Admitting: Family Medicine

## 2012-02-21 MED ORDER — INDOMETHACIN 50 MG PO CAPS: ORAL_CAPSULE | ORAL | Status: DC

## 2012-02-21 NOTE — Telephone Encounter (Signed)
Medication has been sent in unable to speak directly with pt, and I did not leave a message, pls follow up in am

## 2012-02-29 NOTE — Telephone Encounter (Signed)
Called and left multiple messages for pt with no response.

## 2012-03-07 ENCOUNTER — Other Ambulatory Visit: Payer: Self-pay | Admitting: Family Medicine

## 2012-03-08 ENCOUNTER — Other Ambulatory Visit: Payer: Self-pay

## 2012-03-08 MED ORDER — BENAZEPRIL HCL 20 MG PO TABS: 40.0000 mg | ORAL_TABLET | Freq: Every day | ORAL | Status: DC

## 2012-03-16 ENCOUNTER — Ambulatory Visit (INDEPENDENT_AMBULATORY_CARE_PROVIDER_SITE_OTHER): Payer: Medicare Other | Admitting: Orthopedic Surgery

## 2012-03-16 ENCOUNTER — Encounter: Payer: Self-pay | Admitting: Orthopedic Surgery

## 2012-03-16 VITALS — BP 150/80 | Ht 75.0 in | Wt 268.0 lb

## 2012-03-16 DIAGNOSIS — M25579 Pain in unspecified ankle and joints of unspecified foot: Secondary | ICD-10-CM

## 2012-03-16 DIAGNOSIS — M7989 Other specified soft tissue disorders: Secondary | ICD-10-CM

## 2012-03-16 DIAGNOSIS — S93401A Sprain of unspecified ligament of right ankle, initial encounter: Secondary | ICD-10-CM

## 2012-03-16 DIAGNOSIS — S93409A Sprain of unspecified ligament of unspecified ankle, initial encounter: Secondary | ICD-10-CM

## 2012-03-16 NOTE — Progress Notes (Signed)
Subjective:     Patient ID: Carlos Leon, male   DOB: 1946/04/18, 66 y.o.   MRN: [id number]  HPI Chief Complaint  Patient presents with  . Follow-up    6 week recheck left ankle   Diagnosed with Charcot-type foot changes and peroneal tenosynovitis and ankle pain with sprain  Treated with cam walker  Improved  No pain no swelling no numbness    Review of Systems see above    Objective:   Physical Exam Overall appearance is normal  Skin is intact.  His foot looks good including diabetic exam.  Pulses good on the dorsum of the foot.  Swelling has resolved.  Motion is poor has improved.  Strength is good around the joint and the joints are stable and the foot    Assessment:     Result tenosynovitis and ankle joint synovitis    Plan:     Followup as needed

## 2012-03-16 NOTE — Patient Instructions (Signed)
Activity as tolerated

## 2012-03-27 ENCOUNTER — Encounter: Payer: Self-pay | Admitting: Family Medicine

## 2012-04-07 ENCOUNTER — Ambulatory Visit (INDEPENDENT_AMBULATORY_CARE_PROVIDER_SITE_OTHER): Payer: Medicare Other | Admitting: Urology

## 2012-04-07 DIAGNOSIS — N400 Enlarged prostate without lower urinary tract symptoms: Secondary | ICD-10-CM

## 2012-04-07 DIAGNOSIS — R972 Elevated prostate specific antigen [PSA]: Secondary | ICD-10-CM

## 2012-04-24 ENCOUNTER — Ambulatory Visit: Payer: Medicare Other | Admitting: Family Medicine

## 2012-07-07 ENCOUNTER — Other Ambulatory Visit: Payer: Self-pay | Admitting: Family Medicine

## 2012-07-12 ENCOUNTER — Other Ambulatory Visit: Payer: Self-pay | Admitting: Family Medicine

## 2012-08-14 ENCOUNTER — Other Ambulatory Visit: Payer: Self-pay | Admitting: Family Medicine

## 2012-08-15 ENCOUNTER — Other Ambulatory Visit: Payer: Self-pay

## 2012-08-15 MED ORDER — INDOMETHACIN 50 MG PO CAPS: 50.0000 mg | ORAL_CAPSULE | ORAL | Status: DC

## 2012-08-15 NOTE — Telephone Encounter (Signed)
Med refilled.

## 2012-08-15 NOTE — Telephone Encounter (Signed)
Medication  May be refilled.

## 2012-10-12 ENCOUNTER — Other Ambulatory Visit: Payer: Self-pay | Admitting: Family Medicine

## 2012-11-15 ENCOUNTER — Telehealth: Payer: Self-pay | Admitting: Family Medicine

## 2012-11-15 ENCOUNTER — Other Ambulatory Visit: Payer: Self-pay | Admitting: Family Medicine

## 2012-11-15 NOTE — Telephone Encounter (Signed)
Is this ok?

## 2012-11-15 NOTE — Telephone Encounter (Signed)
pls explain he will need to be seen in the office, for re evaluation , including lab work before th medication can be refilled, has not been here since February, dangerous to prescribe meds without doing blood work at reasonable intervals

## 2012-11-16 NOTE — Telephone Encounter (Signed)
Patient aware. States he needs to be seen now due to having gout. Will transfer to [facility] to schedule appt with Dr Jeanice Lim

## 2012-11-17 ENCOUNTER — Encounter: Payer: Self-pay | Admitting: Family Medicine

## 2012-11-17 ENCOUNTER — Ambulatory Visit (INDEPENDENT_AMBULATORY_CARE_PROVIDER_SITE_OTHER): Payer: Medicare Other | Admitting: Family Medicine

## 2012-11-17 VITALS — BP 162/80 | HR 106 | Resp 18 | Ht 75.0 in | Wt 272.0 lb

## 2012-11-17 DIAGNOSIS — M109 Gout, unspecified: Secondary | ICD-10-CM

## 2012-11-17 DIAGNOSIS — R972 Elevated prostate specific antigen [PSA]: Secondary | ICD-10-CM

## 2012-11-17 DIAGNOSIS — Z125 Encounter for screening for malignant neoplasm of prostate: Secondary | ICD-10-CM

## 2012-11-17 DIAGNOSIS — E119 Type 2 diabetes mellitus without complications: Secondary | ICD-10-CM

## 2012-11-17 DIAGNOSIS — E785 Hyperlipidemia, unspecified: Secondary | ICD-10-CM

## 2012-11-17 DIAGNOSIS — I10 Essential (primary) hypertension: Secondary | ICD-10-CM

## 2012-11-17 MED ORDER — INDOMETHACIN 50 MG PO CAPS: 50.0000 mg | ORAL_CAPSULE | Freq: Two times a day (BID) | ORAL | Status: DC

## 2012-11-17 MED ORDER — AMLODIPINE BESYLATE 10 MG PO TABS: 10.0000 mg | ORAL_TABLET | Freq: Every day | ORAL | Status: DC

## 2012-11-17 NOTE — Patient Instructions (Addendum)
Get the labs done today Gout medicine sent Ice to knee New blood pressure pill - Novasc 10mg  Continue benezapril 2 tablets daily  F/U 6 weeks- Dr. Lodema Hong

## 2012-11-18 LAB — PSA, MEDICARE: PSA: 7.26 ng/mL — ABNORMAL HIGH (ref <=4.00)

## 2012-11-18 LAB — CBC
HCT: 40 % (ref 39.0–52.0)
Platelets: 217 10*3/uL (ref 150–400)
RDW: 16.3 % — ABNORMAL HIGH (ref 11.5–15.5)
WBC: 12.1 10*3/uL — ABNORMAL HIGH (ref 4.0–10.5)

## 2012-11-18 LAB — COMPREHENSIVE METABOLIC PANEL
ALT: 17 U/L (ref 0–53)
Albumin: 4.2 g/dL (ref 3.5–5.2)
CO2: 28 mEq/L (ref 19–32)
Calcium: 9.4 mg/dL (ref 8.4–10.5)
Chloride: 104 mEq/L (ref 96–112)
Creat: 1.19 mg/dL (ref 0.50–1.35)
Potassium: 4.1 mEq/L (ref 3.5–5.3)
Total Protein: 7 g/dL (ref 6.0–8.3)

## 2012-11-18 LAB — HEMOGLOBIN A1C: Mean Plasma Glucose: 126 mg/dL — ABNORMAL HIGH (ref <117)

## 2012-11-19 DIAGNOSIS — R972 Elevated prostate specific antigen [PSA]: Secondary | ICD-10-CM | POA: Insufficient documentation

## 2012-11-19 NOTE — Progress Notes (Addendum)
  Subjective:    Patient ID: Carlos Leon, male    DOB: August 08, 1946, 66 y.o.   MRN: [id number]  HPI  Pt here weith gout pain, typical gout flare now in right knee, unable to get indomethacin sent in because he has not been seen since Feb and has HTN, DM, hyperlipidemia, no recent labs. Using his walker currently due to pain Taking allopurinol  He is also found to have elevated PSA in past, he did not follow through with urology for evaluation for prostate cancer   Review of Systems   GEN- denies fatigue, fever, weight loss,weakness, recent illness HEENT- denies eye drainage, change in vision, nasal discharge, CVS- denies chest pain, palpitations RESP- denies SOB, cough, wheeze ABD- denies N/V, change in stools, abd pain GU- denies dysuria, hematuria, dribbling, incontinence MSK- + joint pain, muscle aches, injury Neuro- denies headache, dizziness, syncope, seizure activity      Objective:   Physical Exam GEN- NAD, alert and oriented x3 , repeat BP 158/82  HEENT- PERRL, EOMI, non injected sclera, pink conjunctiva, MMM, oropharynx clear CVS- RRR with a few PVC, no murmur RESP-CTAB MSK- + effusion Right knee, Decreased ROM, + warmth,  EXT- No edema Pulses- Radial, DP- 2+        Assessment & Plan:

## 2012-11-19 NOTE — Assessment & Plan Note (Signed)
Uncontrolled, increase norvasc to 10mg , continue ACEI Check lytes

## 2012-11-19 NOTE — Assessment & Plan Note (Signed)
Refer to urology.  ?

## 2012-11-19 NOTE — Assessment & Plan Note (Signed)
Indomethacin given, ICE, if no improvement refer to ortho for aspiration

## 2012-11-19 NOTE — Assessment & Plan Note (Signed)
Check A1C continue oral meds

## 2012-11-19 NOTE — Assessment & Plan Note (Signed)
Check Direct LDL, he has not been taking statin drug

## 2012-12-12 ENCOUNTER — Other Ambulatory Visit: Payer: Self-pay | Admitting: Family Medicine

## 2012-12-29 ENCOUNTER — Ambulatory Visit (INDEPENDENT_AMBULATORY_CARE_PROVIDER_SITE_OTHER): Payer: Medicare Other | Admitting: Family Medicine

## 2012-12-29 DIAGNOSIS — I10 Essential (primary) hypertension: Secondary | ICD-10-CM

## 2012-12-29 NOTE — Progress Notes (Signed)
  Subjective:    Patient ID: Carlos Leon, male    DOB: 1946-03-01, 67 y.o.   MRN: [id number]  HPI Pt did not keep appontment he ia a no show   Review of Systems     Objective:   Physical Exam        Assessment & Plan:

## 2013-01-09 ENCOUNTER — Encounter: Payer: Self-pay | Admitting: Family Medicine

## 2013-01-09 ENCOUNTER — Ambulatory Visit (INDEPENDENT_AMBULATORY_CARE_PROVIDER_SITE_OTHER): Payer: Medicare Other | Admitting: Family Medicine

## 2013-01-09 VITALS — BP 158/102 | HR 93 | Resp 16 | Ht 75.0 in | Wt 275.4 lb

## 2013-01-09 DIAGNOSIS — E119 Type 2 diabetes mellitus without complications: Secondary | ICD-10-CM

## 2013-01-09 DIAGNOSIS — E785 Hyperlipidemia, unspecified: Secondary | ICD-10-CM

## 2013-01-09 DIAGNOSIS — I10 Essential (primary) hypertension: Secondary | ICD-10-CM

## 2013-01-09 DIAGNOSIS — R972 Elevated prostate specific antigen [PSA]: Secondary | ICD-10-CM

## 2013-01-09 DIAGNOSIS — E669 Obesity, unspecified: Secondary | ICD-10-CM

## 2013-01-09 DIAGNOSIS — R5383 Other fatigue: Secondary | ICD-10-CM

## 2013-01-09 DIAGNOSIS — M109 Gout, unspecified: Secondary | ICD-10-CM

## 2013-01-09 DIAGNOSIS — R5381 Other malaise: Secondary | ICD-10-CM

## 2013-01-09 NOTE — Patient Instructions (Addendum)
Annual wellness in 5 month, call if you need me before  Please commit to exercise at least 3 days, per week, aim for 5 day, swimming is an excellent exercise, use the [employer]  A healthy diet is rich in fruit, vegetables and whole grains. Poultry fish, nuts and beans are a healthy choice for protein rather then red meat. A low sodium diet and drinking 64 ounces of water daily is generally recommended. Oils and sweet should be limited. Carbohydrates especially for those who are diabetic or overweight, should be limited to 30-45 gram per meal. It is important to eat on a regular schedule, at least 3 times daily. Snacks should be primarily fruits, vegetables or nuts.   Weight loss goal of 10 to 12 pounds   Please take all 3 blood pressure pills together every morning  See specialist re prostate as scheduled please  Commit to aspirin 81 mg every day, shown to reduce heart disease risk  Fasting lipid, cmp and eGFR,, hBa1c and TSH  In 5 month

## 2013-01-12 ENCOUNTER — Ambulatory Visit (INDEPENDENT_AMBULATORY_CARE_PROVIDER_SITE_OTHER): Payer: Medicare Other | Admitting: Urology

## 2013-01-12 DIAGNOSIS — N400 Enlarged prostate without lower urinary tract symptoms: Secondary | ICD-10-CM

## 2013-01-12 DIAGNOSIS — R972 Elevated prostate specific antigen [PSA]: Secondary | ICD-10-CM

## 2013-01-14 NOTE — Assessment & Plan Note (Signed)
No recent flare, encouraged to limit cheese, ed meats and any obvious inciting foods/drink

## 2013-01-14 NOTE — Assessment & Plan Note (Signed)
Pt to keep appt with urology in the next 4 weeks

## 2013-01-14 NOTE — Assessment & Plan Note (Signed)
Uncontrolled, non compliant with meds, importance of same stressed

## 2013-01-14 NOTE — Assessment & Plan Note (Signed)
Patient educated about the importance of limiting  Carbohydrate intake , the need to commit to daily physical activity for a minimum of 30 minutes , and to commit weight loss. The fact that changes in all these areas will reduce or eliminate all together the development of diabetes is stressed.    

## 2013-01-14 NOTE — Progress Notes (Signed)
  Subjective:    Patient ID: Carlos Leon, male    DOB: [birth year], 67 y.o.   MRN: [id number]  HPI The PT is here for follow up and re-evaluation of chronic medical conditions, medication management and review of any available recent lab and radiology data.  Preventive health is updated, specifically  Cancer screening and Immunization.   Questions or concerns regarding consultations or procedures which the PT has had in the interim are  addressed. The PT denies any adverse reactions to current medications since the last visit.  There are no new concerns.  There are no specific complaints       Review of Systems See HPI Denies recent fever or chills. Denies sinus pressure, nasal congestion, ear pain or sore throat. Denies chest congestion, productive cough or wheezing. Denies chest pains, palpitations and leg swelling Denies abdominal pain, nausea, vomiting,diarrhea or constipation.   Denies dysuria, frequency, hesitancy or incontinence. Denies joint pain, swelling and limitation in mobility. Denies headaches, seizures, numbness, or tingling. Denies depression, anxiety or insomnia. Denies skin break down or rash.        Objective:   Physical Exam  Patient alert and oriented and in no cardiopulmonary distress.  HEENT: No facial asymmetry, EOMI, no sinus tenderness,  oropharynx pink and moist.  Neck supple no adenopathy.  Chest: Clear to auscultation bilaterally.  CVS: S1, S2 no murmurs, no S3.  ABD: Soft non tender. Bowel sounds normal.  Ext: No edema  MS: Adequate ROM spine, shoulders, hips and knees.  Skin: Intact, no ulcerations or rash noted.  Psych: Good eye contact, normal affect. Memory intact not anxious or depressed appearing.  CNS: CN 2-12 intact, power, tone and sensation normal throughout.       Assessment & Plan:

## 2013-01-14 NOTE — Assessment & Plan Note (Signed)
Deteriorated. Patient re-educated about  the importance of commitment to a  minimum of 150 minutes of exercise per week. The importance of healthy food choices with portion control discussed. Encouraged to start a food diary, count calories and to consider  joining a support group. Sample diet sheets offered. Goals set by the patient for the next several months.    

## 2013-01-14 NOTE — Assessment & Plan Note (Signed)
Hyperlipidemia:Low fat diet discussed and encouraged.   

## 2013-03-14 ENCOUNTER — Other Ambulatory Visit: Payer: Self-pay | Admitting: Family Medicine

## 2013-04-04 ENCOUNTER — Encounter (HOSPITAL_COMMUNITY): Payer: Self-pay

## 2013-04-04 ENCOUNTER — Emergency Department (HOSPITAL_COMMUNITY)
Admission: EM | Admit: 2013-04-04 | Discharge: 2013-04-04 | Disposition: A | Discharge status: Home / Self Care | Payer: Medicare Other | Attending: Emergency Medicine | Admitting: Emergency Medicine

## 2013-04-04 DIAGNOSIS — Z8719 Personal history of other diseases of the digestive system: Secondary | ICD-10-CM | POA: Insufficient documentation

## 2013-04-04 DIAGNOSIS — I10 Essential (primary) hypertension: Secondary | ICD-10-CM | POA: Insufficient documentation

## 2013-04-04 DIAGNOSIS — Y93E9 Activity, other interior property and clothing maintenance: Secondary | ICD-10-CM | POA: Insufficient documentation

## 2013-04-04 DIAGNOSIS — S43499A Other sprain of unspecified shoulder joint, initial encounter: Secondary | ICD-10-CM | POA: Insufficient documentation

## 2013-04-04 DIAGNOSIS — X500XXA Overexertion from strenuous movement or load, initial encounter: Secondary | ICD-10-CM | POA: Insufficient documentation

## 2013-04-04 DIAGNOSIS — Z862 Personal history of diseases of the blood and blood-forming organs and certain disorders involving the immune mechanism: Secondary | ICD-10-CM | POA: Insufficient documentation

## 2013-04-04 DIAGNOSIS — M109 Gout, unspecified: Secondary | ICD-10-CM | POA: Insufficient documentation

## 2013-04-04 DIAGNOSIS — Z79899 Other long term (current) drug therapy: Secondary | ICD-10-CM | POA: Insufficient documentation

## 2013-04-04 DIAGNOSIS — E669 Obesity, unspecified: Secondary | ICD-10-CM | POA: Insufficient documentation

## 2013-04-04 DIAGNOSIS — S46212A Strain of muscle, fascia and tendon of other parts of biceps, left arm, initial encounter: Secondary | ICD-10-CM

## 2013-04-04 DIAGNOSIS — E119 Type 2 diabetes mellitus without complications: Secondary | ICD-10-CM | POA: Insufficient documentation

## 2013-04-04 DIAGNOSIS — Z87891 Personal history of nicotine dependence: Secondary | ICD-10-CM | POA: Insufficient documentation

## 2013-04-04 DIAGNOSIS — S46819A Strain of other muscles, fascia and tendons at shoulder and upper arm level, unspecified arm, initial encounter: Secondary | ICD-10-CM | POA: Insufficient documentation

## 2013-04-04 DIAGNOSIS — Z8739 Personal history of other diseases of the musculoskeletal system and connective tissue: Secondary | ICD-10-CM | POA: Insufficient documentation

## 2013-04-04 DIAGNOSIS — Z860100 Personal history of colon polyps, unspecified: Secondary | ICD-10-CM | POA: Insufficient documentation

## 2013-04-04 DIAGNOSIS — Z8639 Personal history of other endocrine, nutritional and metabolic disease: Secondary | ICD-10-CM | POA: Insufficient documentation

## 2013-04-04 DIAGNOSIS — Y9289 Other specified places as the place of occurrence of the external cause: Secondary | ICD-10-CM | POA: Insufficient documentation

## 2013-04-04 MED ORDER — HYDROCODONE-ACETAMINOPHEN 5-325 MG PO TABS: ORAL_TABLET | ORAL | Status: DC

## 2013-04-04 NOTE — ED Notes (Signed)
Complain pf pain in left upper arm. States he thinks a blood vessel popped

## 2013-04-04 NOTE — ED Notes (Signed)
Patient changed into a gown. Waiting on MD.

## 2013-04-04 NOTE — ED Provider Notes (Signed)
History     CSN: [id number]  Arrival date & time 04/04/13  4098   First MD Initiated Contact with Patient 04/04/13 [phone]      Chief Complaint  Patient presents with  . Arm Injury    (Consider location/radiation/quality/duration/timing/severity/associated sxs/prior treatment) HPI Comments: Patient c/o pain, swelling and bruising to his left upper arm.  States the symptoms began suddenly 4 days ago when he reached up to clean something and felt a sharp pain to his bicep area.  He noticed that when he flexes his arm, he has a deformity of the bicep muscle.  He describes a "burning aching" to the same.  He denies numbness or weakness of his arm.  He also states that he took two doses of an unknown antibiotic approximately one month ago.  He is left hand dominant.    Patient is a 67 y.o. male presenting with arm injury. The history is provided by the patient.  Arm Injury Location:  Arm Time since incident:  4 days Injury: yes   Mechanism of injury comment:  Twisting injury Arm location:  L upper arm Pain details:    Quality:  Aching, burning and throbbing   Radiates to:  Does not radiate   Severity:  Moderate   Onset quality:  Sudden   Timing:  Constant   Progression:  Unchanged Chronicity:  New Handedness:  Left-handed Dislocation: no   Foreign body present:  No foreign bodies Prior injury to area:  No Relieved by:  Nothing Worsened by:  Movement and stretching area Ineffective treatments:  None tried Associated symptoms: swelling   Associated symptoms: no back pain, no decreased range of motion, no fever, no muscle weakness, no neck pain, no stiffness and no tingling   Risk factors: no frequent fractures and no recent illness     Past Medical History  Diagnosis Date  . Hypertension   . Diabetes mellitus   . Hyperlipidemia   . Gout   . Obesity   . Colon polyps   . Diverticulosis of colon   . Arthritis     Past Surgical History  Procedure Laterality Date  . Hernia  repair    . Colon polyps removed  2002    Dr. Katrinka Blazing  . Colonoscopy  2004    Dr. Heloise Beecham prep, ACBE showed diverticulosis  . Colonoscopy  11/24/2011    Procedure: COLONOSCOPY;  Surgeon: Corbin Ade, MD;  Location: [facility];  Service: Endoscopy;  Laterality: N/A;  9:10    Family History  Problem Relation Age of Onset  . Hypertension Mother   . Cancer Mother     rectal, approximately 57  . Diabetes Father   . Liver disease Neg Hx   . Colon cancer Neg Hx     History  Substance Use Topics  . Smoking status: Former Smoker -- 2.00 packs/day for 5 years  . Smokeless tobacco: Not on file  . Alcohol Use: No      Review of Systems  Constitutional: Negative for fever and chills.  HENT: Negative for neck pain.   Respiratory: Negative for chest tightness and shortness of breath.   Cardiovascular: Negative for chest pain and palpitations.  Genitourinary: Negative for flank pain.  Musculoskeletal: Positive for myalgias and joint swelling. Negative for back pain, arthralgias, gait problem and stiffness.  Skin: Positive for color change. Negative for wound.  Neurological: Negative for dizziness, weakness, numbness and headaches.  Hematological: Does not bruise/bleed easily.  All other systems reviewed and  are negative.    Allergies  Review of patient's allergies indicates no known allergies.  Home Medications   Current Outpatient Rx  Name  Route  Sig  Dispense  Refill  . allopurinol (ZYLOPRIM) 300 MG tablet               . amLODipine (NORVASC) 10 MG tablet   Oral   Take 1 tablet (10 mg total) by mouth daily.   30 tablet   4   . benazepril (LOTENSIN) 20 MG tablet      TAKE TWO TABLETS BY MOUTH EVERY DAY   60 tablet   1     BP 149/93  Pulse 74  Temp(Src) 97.6 F (36.4 C) (Oral)  Ht 6\' 3"  (1.905 m)  Wt 270 lb (122.471 kg)  BMI 33.75 kg/m2  SpO2 100%  Physical Exam  Nursing note and vitals reviewed. Constitutional: He is oriented to person,  place, and time. He appears well-developed and well-nourished. No distress.  HENT:  Head: Normocephalic and atraumatic.  Neck: Normal range of motion. Neck supple.  Cardiovascular: Normal rate, regular rhythm, normal heart sounds and intact distal pulses.   No murmur heard. Pulmonary/Chest: Effort normal and breath sounds normal. No respiratory distress.  Musculoskeletal: Normal range of motion. He exhibits edema and tenderness.       Left upper arm: He exhibits tenderness, swelling and deformity. He exhibits no bony tenderness and no laceration.       Arms: Step-off deformity of the distal end of the bicep muscle.  Moderate bruising present, distal sensation intact, grip strength is strong and equal bilaterally, CR< 3 sec.  Elbow is NT. Pt has full ROM of the left arm.    Lymphadenopathy:    He has no cervical adenopathy.  Neurological: He is alert and oriented to person, place, and time. He exhibits normal muscle tone. Coordination normal.  Skin: Skin is warm and dry.    ED Course  Procedures (including critical care time)  Labs Reviewed - No data to display No results found.      MDM    09:16 consulted Dr. Romeo Apple, will see pt in his office .  Advised to place him in a sling and have pt call his office for follow-up appt.   Will prescribe Norco #24 for pain. Pt agrees to sling and ice.    The patient appears reasonably screened and/or stabilized for discharge and I doubt any other medical condition or other [facility] requiring further screening, evaluation, or treatment in the ED at this time prior to discharge.    Carilyn Woolston L. Trisha Mangle, PA-C 04/04/13 1610

## 2013-04-05 NOTE — ED Provider Notes (Signed)
Medical screening examination/treatment/procedure(s) were conducted as a shared visit with non-physician practitioner(s) and myself.  I personally evaluated the patient during the encounter  Donnetta Hutching, MD 04/05/13 [phone]

## 2013-04-18 ENCOUNTER — Encounter: Payer: Self-pay | Admitting: Orthopedic Surgery

## 2013-04-18 ENCOUNTER — Ambulatory Visit (INDEPENDENT_AMBULATORY_CARE_PROVIDER_SITE_OTHER): Payer: Medicare Other | Admitting: Orthopedic Surgery

## 2013-04-18 ENCOUNTER — Ambulatory Visit (INDEPENDENT_AMBULATORY_CARE_PROVIDER_SITE_OTHER): Payer: Medicare Other

## 2013-04-18 VITALS — BP 160/82 | Ht 75.0 in | Wt 271.0 lb

## 2013-04-18 DIAGNOSIS — S46119A Strain of muscle, fascia and tendon of long head of biceps, unspecified arm, initial encounter: Secondary | ICD-10-CM | POA: Insufficient documentation

## 2013-04-18 DIAGNOSIS — M25522 Pain in left elbow: Secondary | ICD-10-CM

## 2013-04-18 DIAGNOSIS — M25529 Pain in unspecified elbow: Secondary | ICD-10-CM

## 2013-04-18 DIAGNOSIS — S46819A Strain of other muscles, fascia and tendons at shoulder and upper arm level, unspecified arm, initial encounter: Secondary | ICD-10-CM

## 2013-04-18 DIAGNOSIS — S46212A Strain of muscle, fascia and tendon of other parts of biceps, left arm, initial encounter: Secondary | ICD-10-CM

## 2013-04-18 NOTE — Patient Instructions (Signed)
activities as tolerated 

## 2013-04-18 NOTE — Progress Notes (Signed)
  Subjective:    Patient ID: Carlos Rump., male    DOB: 02-Feb-1946, 67 y.o.   MRN: [id number]  Chief Complaint  Patient presents with  . Arm Pain    Muscle in left arm d/t injury 03/28/13    Arm Pain  The incident occurred more than 1 week ago. The incident occurred at home. The injury mechanism was repetitive motion. The pain is present in the left elbow (Left biceps). The quality of the pain is described as aching. The pain does not radiate. The pain is mild. The pain has been improving since the incident.      Review of Systems     Objective:   Physical Exam  Vitals reviewed. Constitutional: He is oriented to person, place, and time. He appears well-developed and well-nourished.  HENT:  Head: Normocephalic.  Eyes: Right eye exhibits no discharge. Left eye exhibits no discharge.  Neck: Normal range of motion.  Cardiovascular: Intact distal pulses.   Musculoskeletal:  Ambulation is normal  The left biceps has a Popeye deformity. He the biceps tendon is intact as evidenced by the hook test. There is no weakness in supination pronation or flexion he denies shoulder pain. Elbow is stable scans intact. Normal sensation distally.  Lymphadenopathy:       Left cervical: No superficial cervical adenopathy present.    He has no axillary adenopathy.  Neurological: He is alert and oriented to person, place, and time. He exhibits normal muscle tone. Coordination normal.  Skin: Skin is warm and dry. No rash noted. No erythema. No pallor.  Psychiatric: He has a normal mood and affect. His behavior is normal. Judgment and thought content normal.   BP 160/82  Ht 6\' 3"  (1.905 m)  Wt 271 lb (122.925 kg)  BMI 33.87 kg/m2        Assessment & Plan:  Left elbow x-ray to confirm no bony injury is normal  Left proximal biceps tendon injury patient is asymptomatic has returned all normal activities  Followup as needed  Elbow pain, left - Plan: DG Elbow 2 Views Left  Biceps tendon  rupture, proximal, left, initial encounter

## 2013-04-23 ENCOUNTER — Ambulatory Visit: Payer: Medicare Other | Admitting: Orthopedic Surgery

## 2013-05-15 ENCOUNTER — Telehealth: Payer: Self-pay | Admitting: Family Medicine

## 2013-05-15 MED ORDER — AMLODIPINE BESYLATE 10 MG PO TABS: 10.0000 mg | ORAL_TABLET | Freq: Every day | ORAL | Status: DC

## 2013-05-15 MED ORDER — BENAZEPRIL HCL 20 MG PO TABS: ORAL_TABLET | ORAL | Status: DC

## 2013-05-15 NOTE — Telephone Encounter (Signed)
Pharmacy changed and BP med sent

## 2013-05-15 NOTE — Telephone Encounter (Signed)
Noted  

## 2013-05-17 ENCOUNTER — Ambulatory Visit: Payer: Medicare Other | Admitting: Family Medicine

## 2013-07-09 ENCOUNTER — Ambulatory Visit (HOSPITAL_COMMUNITY)
Admission: RE | Admit: 2013-07-09 | Discharge: 2013-07-09 | Disposition: A | Discharge status: Home / Self Care | Payer: Medicare Other | Source: Ambulatory Visit | Attending: Family Medicine | Admitting: Family Medicine

## 2013-07-09 ENCOUNTER — Ambulatory Visit (INDEPENDENT_AMBULATORY_CARE_PROVIDER_SITE_OTHER): Payer: Medicare Other | Admitting: Family Medicine

## 2013-07-09 ENCOUNTER — Encounter: Payer: Self-pay | Admitting: Family Medicine

## 2013-07-09 VITALS — BP 120/80 | HR 75 | Resp 16 | Ht 73.5 in | Wt 276.0 lb

## 2013-07-09 DIAGNOSIS — Z87891 Personal history of nicotine dependence: Secondary | ICD-10-CM | POA: Insufficient documentation

## 2013-07-09 DIAGNOSIS — R972 Elevated prostate specific antigen [PSA]: Secondary | ICD-10-CM

## 2013-07-09 DIAGNOSIS — E785 Hyperlipidemia, unspecified: Secondary | ICD-10-CM

## 2013-07-09 DIAGNOSIS — Z024 Encounter for examination for driving license: Secondary | ICD-10-CM

## 2013-07-09 DIAGNOSIS — R809 Proteinuria, unspecified: Secondary | ICD-10-CM

## 2013-07-09 DIAGNOSIS — Z0289 Encounter for other administrative examinations: Secondary | ICD-10-CM

## 2013-07-09 DIAGNOSIS — I4892 Unspecified atrial flutter: Secondary | ICD-10-CM

## 2013-07-09 DIAGNOSIS — R7303 Prediabetes: Secondary | ICD-10-CM

## 2013-07-09 DIAGNOSIS — I10 Essential (primary) hypertension: Secondary | ICD-10-CM

## 2013-07-09 DIAGNOSIS — R7309 Other abnormal glucose: Secondary | ICD-10-CM

## 2013-07-09 LAB — POCT URINALYSIS DIPSTICK
Bilirubin, UA: NEGATIVE
Ketones, UA: NEGATIVE
Leukocytes, UA: NEGATIVE
Protein, UA: 30
Spec Grav, UA: 1.02

## 2013-07-09 NOTE — Patient Instructions (Addendum)
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

## 2013-07-10 LAB — LIPID PANEL
Cholesterol: 196 mg/dL (ref 0–200)
Total CHOL/HDL Ratio: 4.6 Ratio
VLDL: 23 mg/dL (ref 0–40)

## 2013-07-10 LAB — BASIC METABOLIC PANEL
BUN: 13 mg/dL (ref 6–23)
Chloride: 104 mEq/L (ref 96–112)
Glucose, Bld: 119 mg/dL — ABNORMAL HIGH (ref 70–99)
Potassium: 4.6 mEq/L (ref 3.5–5.3)
Sodium: 139 mEq/L (ref 135–145)

## 2013-07-10 LAB — HEMOGLOBIN A1C: Mean Plasma Glucose: 123 mg/dL — ABNORMAL HIGH (ref <117)

## 2013-07-19 ENCOUNTER — Encounter: Payer: Self-pay | Admitting: Family Medicine

## 2013-07-26 ENCOUNTER — Other Ambulatory Visit: Payer: Self-pay | Admitting: Family Medicine

## 2013-07-26 DIAGNOSIS — R809 Proteinuria, unspecified: Secondary | ICD-10-CM

## 2013-07-30 ENCOUNTER — Other Ambulatory Visit (HOSPITAL_COMMUNITY): Payer: Medicare Other

## 2013-07-30 ENCOUNTER — Ambulatory Visit (HOSPITAL_COMMUNITY)
Admission: RE | Admit: 2013-07-30 | Discharge: 2013-07-30 | Disposition: A | Discharge status: Home / Self Care | Payer: Medicare Other | Source: Ambulatory Visit | Attending: Family Medicine | Admitting: Family Medicine

## 2013-07-30 DIAGNOSIS — R809 Proteinuria, unspecified: Secondary | ICD-10-CM | POA: Insufficient documentation

## 2013-07-30 DIAGNOSIS — E119 Type 2 diabetes mellitus without complications: Secondary | ICD-10-CM | POA: Insufficient documentation

## 2013-07-30 DIAGNOSIS — I10 Essential (primary) hypertension: Secondary | ICD-10-CM | POA: Insufficient documentation

## 2013-07-30 DIAGNOSIS — R9389 Abnormal findings on diagnostic imaging of other specified body structures: Secondary | ICD-10-CM | POA: Insufficient documentation

## 2013-08-06 ENCOUNTER — Ambulatory Visit: Payer: Medicare Other | Admitting: Cardiology

## 2013-08-06 ENCOUNTER — Ambulatory Visit (INDEPENDENT_AMBULATORY_CARE_PROVIDER_SITE_OTHER): Payer: Medicare Other | Admitting: Cardiology

## 2013-08-06 ENCOUNTER — Ambulatory Visit: Payer: Medicare Other | Admitting: Cardiovascular Disease

## 2013-08-06 ENCOUNTER — Encounter: Payer: Self-pay | Admitting: Cardiology

## 2013-08-06 VITALS — BP 140/90 | HR 66 | Ht 73.5 in | Wt 274.8 lb

## 2013-08-06 DIAGNOSIS — I4891 Unspecified atrial fibrillation: Secondary | ICD-10-CM

## 2013-08-06 DIAGNOSIS — I10 Essential (primary) hypertension: Secondary | ICD-10-CM

## 2013-08-06 NOTE — Assessment & Plan Note (Addendum)
Likely permanent, long-standing by history. Seems to be adequately rate controlled at baseline without specific medication intervention. Fortunately, patient has not been symptomatic in terms of palpitations or breathlessness. We did discuss his relative stroke risk as noted above, although at this point he does not want to pursue anticoagulant treatment. He agreed to take an aspirin daily, has been doing this intermittently over the last few years. He reports no major functional limitations. Would recommend an echocardiogram for baseline cardiac structural assessment, could not locate any prior study per record review, although patient states he did have a heart catheterization several years ago that showed "normal arteries." Unless there are significant abnormalities by echocardiography, no further cardiac workup is anticipated at this time. We can see him back if the situation changes.

## 2013-08-06 NOTE — Assessment & Plan Note (Signed)
Blood pressure mildly elevated today. Keep followup with Dr. Lodema Hong.

## 2013-08-06 NOTE — Progress Notes (Signed)
Clinical Summary Carlos Leon is a 67 y.o.male referred for cardiology consultation by Dr. Lodema Hong. He has a chronic history of "irregular heartbeat." Record review finds documented atrial fibrillation at least since February 2013 by ECG. He indicates that he was previously on Coumadin, presumptively has permanent atrial fibrillation. He reports no sense of palpitations, no unusual breathlessness or chest pain. He exercises 2-3 days a week at the [facility]. Plays golf regularly. 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  CHADS2 score is 2 (CHADS2-VASC is 3). We did discuss his stroke risk, relative benefit of anticoagulants versus aspirin. He has a previous history of hematochezia with colonic polyps, nothing recently. States that he did take Coumadin previously without major bleeding, but is not interested in resuming any anticoagulants at this time. He is willing to take an aspirin daily.  No Known Allergies  Current Outpatient Prescriptions  Medication Sig Dispense Refill  . amLODipine (NORVASC) 10 MG tablet Take 1 tablet (10 mg total) by mouth daily.  30 tablet  4  . benazepril (LOTENSIN) 20 MG tablet TAKE TWO TABLETS BY MOUTH EVERY DAY  60 tablet  4   No current facility-administered medications for this visit.    Past Medical History  Diagnosis Date  . Essential hypertension, benign   . Type 2 diabetes mellitus   . Hyperlipidemia   . Gout   . Obesity   . Colon polyps   . Diverticulosis of colon   . Arthritis   . Atrial fibrillation     Present by ECG 01/2012    Past Surgical History  Procedure Laterality Date  . Hernia repair    . Colon polyps removed  2002    Dr. Katrinka Blazing  . Colonoscopy  2004    Dr. Heloise Beecham prep, ACBE showed diverticulosis  . Colonoscopy  11/24/2011    Procedure: COLONOSCOPY;  Surgeon: Corbin Ade, MD;  Location: [facility];  Service: Endoscopy;  Laterality: N/A;  9:10    Family History  Problem Relation Age of Onset  .  Hypertension Mother   . Cancer Mother     Rectal, approximately 59  . Diabetes Father   . Liver disease Neg Hx   . Colon cancer Neg Hx     Social History Carlos Leon reports that he has quit smoking. His smoking use included Cigarettes. He has a 10 pack-year smoking history. He does not have any smokeless tobacco history on file. Carlos Leon reports that he does not drink alcohol.  Review of Systems Good appetite. No cough. NYHA class I dyspnea. No orthopnea or PND. No focal motor weakness or speech deficits. No swelling. Otherwise negative.  Physical Examination Filed Vitals:   08/06/13 0844  BP: 140/90  Pulse: 66   Filed Weights   08/06/13 0844  Weight: 274 lb 12 oz (124.626 kg)   Overweight male, appears comfortable at rest. HEENT: Conjunctiva and lids normal, oropharynx clear with moist mucosa. Neck: Supple, no elevated JVP or carotid bruits, no thyromegaly. Lungs: Clear to auscultation, nonlabored breathing at rest. Cardiac: Irregularly irregular, no S3 or significant systolic murmur, no pericardial rub. Abdomen: Soft, nontender, bowel sounds present, no guarding or rebound. Extremities: No pitting edema, distal pulses 2+. Skin: Warm and dry. Musculoskeletal: No kyphosis. Neuropsychiatric: Alert and oriented x3, affect grossly appropriate.   Problem List and Plan   Atrial fibrillation Likely permanent, long-standing by history. Seems to be adequately rate controlled at baseline without specific medication intervention. Fortunately, patient has  not been symptomatic in terms of palpitations or breathlessness. We did discuss his relative stroke risk as noted above, although at this point he does not want to pursue anticoagulant treatment. He agreed to take an aspirin daily, has been doing this intermittently over the last few years. He reports no major functional limitations. Would recommend an echocardiogram for baseline cardiac structural assessment, could not locate any  prior study per record review, although patient states he did have a heart catheterization several years ago that showed "normal arteries." Unless there are significant abnormalities by echocardiography, no further cardiac workup is anticipated at this time. We can see him back if the situation changes.  Essential hypertension, benign Blood pressure mildly elevated today. Keep followup with Dr. Lodema Hong.    Jonelle Sidle, M.D., F.A.C.C.

## 2013-08-06 NOTE — Patient Instructions (Addendum)
Your physician recommends that you schedule a follow-up appointment in:AS NEEDED  Your physician has requested that you have an echocardiogram. Echocardiography is a painless test that uses sound waves to create images of your heart. It provides your doctor with information about the size and shape of your heart and how well your heart's chambers and valves are working. This procedure takes approximately one hour. There are no restrictions for this procedure WE WILL CALL YOU WITH THE RESULTS 

## 2013-08-07 ENCOUNTER — Other Ambulatory Visit: Payer: Self-pay

## 2013-08-07 MED ORDER — BENAZEPRIL HCL 20 MG PO TABS: ORAL_TABLET | ORAL | Status: DC

## 2013-08-07 MED ORDER — AMLODIPINE BESYLATE 10 MG PO TABS: 10.0000 mg | ORAL_TABLET | Freq: Every day | ORAL | Status: DC

## 2013-08-08 DIAGNOSIS — Z024 Encounter for examination for driving license: Secondary | ICD-10-CM | POA: Insufficient documentation

## 2013-08-08 DIAGNOSIS — R809 Proteinuria, unspecified: Secondary | ICD-10-CM | POA: Insufficient documentation

## 2013-08-08 NOTE — Assessment & Plan Note (Addendum)
Documentation is on form Pt to have cardiology evaluation before certificate will be provided due irregular heart rate Proteinuria is also to be followed up with renal US

## 2013-08-08 NOTE — Assessment & Plan Note (Signed)
Controlled, no change in medication  

## 2013-08-08 NOTE — Assessment & Plan Note (Signed)
Wants no urologic evaluation

## 2013-08-08 NOTE — Progress Notes (Signed)
  Subjective:    Patient ID: Carlos Rump., male    DOB: 02/16/1946, 67 y.o.   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 Pertinent exam findings which require follow up are an irregularly irregular heart rate, which is not new, and had not been followed up by the pt when he was referred to cardiology several years ago. He also has proteinuria, most  likely from longstanding hypertension. He will be referred for a renal US   Review of Systems    Documented on form Objective:   Physical Exam  Documentation is on form which will be scanned int electronic record, and also kept in the office for 3 years      Assessment & Plan:

## 2013-08-08 NOTE — Assessment & Plan Note (Signed)
Renal ultrasound ordered to further evaluate

## 2013-08-09 ENCOUNTER — Encounter: Payer: Medicare Other | Admitting: Family Medicine

## 2013-08-14 ENCOUNTER — Other Ambulatory Visit (HOSPITAL_COMMUNITY): Payer: Medicare Other

## 2013-08-14 ENCOUNTER — Telehealth: Payer: Self-pay | Admitting: Family Medicine

## 2013-08-14 ENCOUNTER — Ambulatory Visit (HOSPITAL_COMMUNITY)
Admission: RE | Admit: 2013-08-14 | Discharge: 2013-08-14 | Disposition: A | Discharge status: Home / Self Care | Payer: Medicare Other | Source: Ambulatory Visit | Attending: Cardiology | Admitting: Cardiology

## 2013-08-14 DIAGNOSIS — I4891 Unspecified atrial fibrillation: Secondary | ICD-10-CM | POA: Insufficient documentation

## 2013-08-14 DIAGNOSIS — I079 Rheumatic tricuspid valve disease, unspecified: Secondary | ICD-10-CM | POA: Insufficient documentation

## 2013-08-14 DIAGNOSIS — I080 Rheumatic disorders of both mitral and aortic valves: Secondary | ICD-10-CM | POA: Insufficient documentation

## 2013-08-14 DIAGNOSIS — I10 Essential (primary) hypertension: Secondary | ICD-10-CM | POA: Insufficient documentation

## 2013-08-14 DIAGNOSIS — I059 Rheumatic mitral valve disease, unspecified: Secondary | ICD-10-CM

## 2013-08-14 NOTE — Telephone Encounter (Signed)
Error - patient did not know which BP meds he needed filled - will call back.

## 2013-08-14 NOTE — Progress Notes (Signed)
*  PRELIMINARY RESULTS* Echocardiogram 2D Echocardiogram has been performed.  Carlos Leon 08/14/2013, 9:00 AM

## 2013-08-14 NOTE — Telephone Encounter (Signed)
This has been sent over since Aug 12.

## 2013-08-15 ENCOUNTER — Emergency Department (HOSPITAL_COMMUNITY)
Admission: EM | Admit: 2013-08-15 | Discharge: 2013-08-15 | Disposition: A | Discharge status: Home / Self Care | Payer: Medicare Other | Attending: Emergency Medicine | Admitting: Emergency Medicine

## 2013-08-15 ENCOUNTER — Emergency Department (HOSPITAL_COMMUNITY): Payer: Medicare Other

## 2013-08-15 ENCOUNTER — Encounter (HOSPITAL_COMMUNITY): Payer: Self-pay | Admitting: *Deleted

## 2013-08-15 DIAGNOSIS — S0990XA Unspecified injury of head, initial encounter: Secondary | ICD-10-CM | POA: Insufficient documentation

## 2013-08-15 DIAGNOSIS — W1809XA Striking against other object with subsequent fall, initial encounter: Secondary | ICD-10-CM | POA: Insufficient documentation

## 2013-08-15 DIAGNOSIS — R05 Cough: Secondary | ICD-10-CM | POA: Insufficient documentation

## 2013-08-15 DIAGNOSIS — Z8719 Personal history of other diseases of the digestive system: Secondary | ICD-10-CM | POA: Insufficient documentation

## 2013-08-15 DIAGNOSIS — Z862 Personal history of diseases of the blood and blood-forming organs and certain disorders involving the immune mechanism: Secondary | ICD-10-CM | POA: Insufficient documentation

## 2013-08-15 DIAGNOSIS — R55 Syncope and collapse: Secondary | ICD-10-CM | POA: Insufficient documentation

## 2013-08-15 DIAGNOSIS — R404 Transient alteration of awareness: Secondary | ICD-10-CM | POA: Insufficient documentation

## 2013-08-15 DIAGNOSIS — Y9289 Other specified places as the place of occurrence of the external cause: Secondary | ICD-10-CM | POA: Insufficient documentation

## 2013-08-15 DIAGNOSIS — J3489 Other specified disorders of nose and nasal sinuses: Secondary | ICD-10-CM | POA: Insufficient documentation

## 2013-08-15 DIAGNOSIS — Y9389 Activity, other specified: Secondary | ICD-10-CM | POA: Insufficient documentation

## 2013-08-15 DIAGNOSIS — R1310 Dysphagia, unspecified: Secondary | ICD-10-CM | POA: Insufficient documentation

## 2013-08-15 DIAGNOSIS — S0993XA Unspecified injury of face, initial encounter: Secondary | ICD-10-CM | POA: Insufficient documentation

## 2013-08-15 DIAGNOSIS — Z860100 Personal history of colon polyps, unspecified: Secondary | ICD-10-CM | POA: Insufficient documentation

## 2013-08-15 DIAGNOSIS — R059 Cough, unspecified: Secondary | ICD-10-CM | POA: Insufficient documentation

## 2013-08-15 DIAGNOSIS — E119 Type 2 diabetes mellitus without complications: Secondary | ICD-10-CM | POA: Insufficient documentation

## 2013-08-15 DIAGNOSIS — Z8679 Personal history of other diseases of the circulatory system: Secondary | ICD-10-CM | POA: Insufficient documentation

## 2013-08-15 DIAGNOSIS — Z8739 Personal history of other diseases of the musculoskeletal system and connective tissue: Secondary | ICD-10-CM | POA: Insufficient documentation

## 2013-08-15 DIAGNOSIS — I10 Essential (primary) hypertension: Secondary | ICD-10-CM | POA: Insufficient documentation

## 2013-08-15 DIAGNOSIS — Z8639 Personal history of other endocrine, nutritional and metabolic disease: Secondary | ICD-10-CM | POA: Insufficient documentation

## 2013-08-15 DIAGNOSIS — Z79899 Other long term (current) drug therapy: Secondary | ICD-10-CM | POA: Insufficient documentation

## 2013-08-15 DIAGNOSIS — E669 Obesity, unspecified: Secondary | ICD-10-CM | POA: Insufficient documentation

## 2013-08-15 DIAGNOSIS — Z87891 Personal history of nicotine dependence: Secondary | ICD-10-CM | POA: Insufficient documentation

## 2013-08-15 DIAGNOSIS — R42 Dizziness and giddiness: Secondary | ICD-10-CM | POA: Insufficient documentation

## 2013-08-15 NOTE — ED Notes (Signed)
D/c instructions given to the pt, no scripts given, verbalized understanding of all.

## 2013-08-15 NOTE — ED Provider Notes (Signed)
CSN: [id number]     Arrival date & time 08/15/13  1130 History  This chart was scribed for Shelda Jakes, MD by Blanchard Kelch, ED Scribe. The patient was seen in room APA05/APA05. Patient's care was started at 12:36 PM.     Chief Complaint  Patient presents with  . Fall  . Head Injury    Patient is a 67 y.o. male presenting with head injury. The history is provided by the patient. No language interpreter was used.  Head Injury Time since incident:  1 week Mechanism of injury: fall   Pain details:    Severity:  Mild   Timing:  Intermittent Worsened by:  Movement and coughing Associated symptoms: loss of consciousness and neck pain   Associated symptoms: no nausea and no vomiting     HPI Comments: Carlos Leon is a 67 y.o. male who presents to the Emergency Department complaining of head injury that occurred 5 days ago. Patient states that last week, he had a choking episode while eating, lost consciousness and hit the top of his head. He is reporting some mild, intermittent discomfort that he rates 2/10 now. He says that since the fall last week, he has not felt completley back to normal. Pain is worse with turning of his head and coughing. There is associated neck pain. Patient's other symptoms include congestion. Patient denies nausea, vomiting, loss of balance, weakness, fever, chills, visual changes, diarrhea, dysuria, chest pain, shortness of breath, leg swelling, history of bleeding easily, back pain, or dizziness. Patient has a medical history of HTN, diabetes, hyperlipidemia, arthritis and atrial fibrillation. He is a former smoker.  PCP- Dr. Lodema Hong  Past Medical History  Diagnosis Date  . Essential hypertension, benign   . Type 2 diabetes mellitus   . Hyperlipidemia   . Gout   . Obesity   . Colon polyps   . Diverticulosis of colon   . Arthritis   . Atrial fibrillation     Present by ECG 01/2012   Past Surgical History  Procedure Laterality Date  . Hernia  repair    . Colon polyps removed  2002    Dr. Katrinka Blazing  . Colonoscopy  2004    Dr. Heloise Beecham prep, ACBE showed diverticulosis  . Colonoscopy  11/24/2011    Procedure: COLONOSCOPY;  Surgeon: Corbin Ade, MD;  Location: [facility];  Service: Endoscopy;  Laterality: N/A;  9:10   Family History  Problem Relation Age of Onset  . Hypertension Mother   . Cancer Mother     Rectal, approximately 75  . Diabetes Father   . Liver disease Neg Hx   . Colon cancer Neg Hx    History  Substance Use Topics  . Smoking status: Former Smoker -- 2.00 packs/day for 5 years    Types: Cigarettes  . Smokeless tobacco: Not on file  . Alcohol Use: No    Review of Systems  Constitutional: Negative for fever and chills.  HENT: Positive for congestion, rhinorrhea and neck pain. Negative for sore throat.   Eyes: Negative for visual disturbance.  Respiratory: Negative for cough and shortness of breath.   Cardiovascular: Negative for chest pain and leg swelling.  Gastrointestinal: Negative for nausea, vomiting and diarrhea.  Genitourinary: Negative for dysuria.  Musculoskeletal: Negative for myalgias and back pain.  Skin: Negative for rash.  Neurological: Positive for loss of consciousness and syncope. Negative for dizziness, weakness and light-headedness.    Allergies  Review of patient's allergies indicates no known  allergies.  Home Medications   Current Outpatient Rx  Name  Route  Sig  Dispense  Refill  . amLODipine (NORVASC) 10 MG tablet   Oral   Take 1 tablet (10 mg total) by mouth daily.   30 tablet   4   . benazepril (LOTENSIN) 20 MG tablet   Oral   Take 40 mg by mouth daily.          Triage Vitals: BP 130/80  Pulse 85  Temp(Src) 97.8 F (36.6 C) (Oral)  Resp 20  Ht 6\' 2"  (1.88 m)  Wt 274 lb (124.286 kg)  BMI 35.16 kg/m2  SpO2 100%  Physical Exam  Constitutional: He is oriented to person, place, and time. He appears well-developed and well-nourished.  HENT:  Head:  Normocephalic and atraumatic.  Mouth/Throat: Oropharynx is clear and moist.  Mucous membranes moist.  No hematoma.  Eyes: Conjunctivae and EOM are normal.  Neck:  No cervical spine tenderness.  Cardiovascular: Normal rate and regular rhythm.   No murmur heard. Pulmonary/Chest: Effort normal and breath sounds normal. No respiratory distress.  Abdominal: Bowel sounds are normal. There is no tenderness.  Musculoskeletal: Normal range of motion.  No pedal edema.   Lymphadenopathy:    He has no cervical adenopathy.  Neurological: He is alert and oriented to person, place, and time. No cranial nerve deficit.  Skin: Skin is warm and dry.    ED Course   DIAGNOSTIC STUDIES: Oxygen Saturation is 100% on room air, normal by my interpretation.    COORDINATION OF CARE: 12:45 PM - Will order CT of head and cervical spine to rule out acute injuries/pathologies as a result of patient's fall last week. Patient verbalizes understanding and agrees with treatment plan.    Procedures (including critical care time)  Labs Reviewed - No data to display Ct Head Wo Contrast  08/15/2013   *RADIOLOGY REPORT*  Clinical Data:  Fall.  Head injury  CT HEAD WITHOUT CONTRAST CT CERVICAL SPINE WITHOUT CONTRAST  Technique:  Multidetector CT imaging of the head and cervical spine was performed following the standard protocol without intravenous contrast.  Multiplanar CT image reconstructions of the cervical spine were also generated.  Comparison:   None  CT HEAD  Findings: Bilateral frontal extra-axial low density fluid collections containing areas of slightly increased density.  No high density hemorrhage.  These hygromas are   of indeterminate age.  No acute hemorrhage.  Negative for infarct or mass.  Ventricle size is normal and there is no midline shift.  Negative for fracture.  IMPRESSION: Bifrontal subdural hygromas of indeterminate age.  This could be post-traumatic.  No high density hemorrhage is identified.   CT CERVICAL SPINE  Findings: Normal alignment with cervical kyphosis.  Negative for fracture.  C3-4:  Disc degeneration and spondylosis with moderate spinal stenosis.  C4-5:  Disc degeneration and spondylosis with moderate spinal stenosis.  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  There is flattening of the cord with moderate right spinal stenosis and foraminal encroachment.  C6-7:  Disc degeneration and spondylosis with right foraminal encroachment due to spurring.  C7-T1:  Disc degeneration and spondylosis.  IMPRESSION: Negative for cervical spine fracture.  Multilevel disc degeneration and spondylosis as above.   Original Report Authenticated By: Janeece Riggers, M.D.   Ct Cervical Spine Wo Contrast  08/15/2013   *RADIOLOGY REPORT*  Clinical Data:  Fall.  Head injury  CT HEAD WITHOUT CONTRAST  CT CERVICAL SPINE WITHOUT CONTRAST  Technique:  Multidetector CT imaging of the head and cervical spine was performed following the standard protocol without intravenous contrast.  Multiplanar CT image reconstructions of the cervical spine were also generated.  Comparison:   None  CT HEAD  Findings: Bilateral frontal extra-axial low density fluid collections containing areas of slightly increased density.  No high density hemorrhage.  These hygromas are   of indeterminate age.  No acute hemorrhage.  Negative for infarct or mass.  Ventricle size is normal and there is no midline shift.  Negative for fracture.  IMPRESSION: Bifrontal subdural hygromas of indeterminate age.  This could be post-traumatic.  No high density hemorrhage is identified.  CT CERVICAL SPINE  Findings: Normal alignment with cervical kyphosis.  Negative for fracture.  C3-4:  Disc degeneration and spondylosis with moderate spinal stenosis.  C4-5:  Disc degeneration and spondylosis with moderate spinal stenosis.  Left foraminal encroachment due to spurring  C5-6:  Ossification the  posterior longitudinal ligament in the midline.  There is flattening of the cord with moderate right spinal stenosis and foraminal encroachment.  C6-7:  Disc degeneration and spondylosis with right foraminal encroachment due to spurring.  C7-T1:  Disc degeneration and spondylosis.  IMPRESSION: Negative for cervical spine fracture.  Multilevel disc degeneration and spondylosis as above.   Original Report Authenticated By: Janeece Riggers, M.D.   1. Head injury, initial encounter     MDM  Workup negative for any acute injuries to the cervical spine the skull or the brain. Perhaps some evidence of old injury. Patient to follow with primary care Dr. if symptoms persist which may be consistent with concussion patient can have an MRI done. Results of the CT scan provided to the patient to take her primary care provider. Patient in no acute distress nontoxic.   I personally performed the services described in this documentation, which was scribed in my presence. The recorded information has been reviewed and is accurate.     Shelda Jakes, MD 08/15/13 [phone]

## 2013-08-15 NOTE — ED Notes (Signed)
Pt got choked last Friday while eating and passed out and hit his head. Pt states "My head hasn't felt right since then."

## 2013-08-15 NOTE — ED Notes (Signed)
Pt reports last Friday was eating "meat skins", got choked, and passed out.  Reports hit back of head on his floor.  Pt says since then has had a "weird sensation" in his head.  Pt says feels like fluid is "sloshing around in his head."  Denies pain but patient says feels this sensation when he moves his head.  Pt alert and oriented.  Denies any pain.

## 2013-08-29 ENCOUNTER — Other Ambulatory Visit: Payer: Self-pay

## 2013-08-29 MED ORDER — AMLODIPINE BESYLATE 10 MG PO TABS: 10.0000 mg | ORAL_TABLET | Freq: Every day | ORAL | Status: DC

## 2013-08-29 MED ORDER — BENAZEPRIL HCL 20 MG PO TABS: 40.0000 mg | ORAL_TABLET | Freq: Every day | ORAL | Status: DC

## 2013-11-01 ENCOUNTER — Other Ambulatory Visit: Payer: Self-pay

## 2013-11-19 ENCOUNTER — Ambulatory Visit: Payer: Medicare Other | Admitting: Family Medicine

## 2014-02-27 ENCOUNTER — Telehealth: Payer: Self-pay | Admitting: Family Medicine

## 2014-02-27 NOTE — Telephone Encounter (Signed)
Called pt no answer. Mailed a letter that he needs to schedule an appt prior to any more med refills, especially since he requesting a 3 month supply

## 2014-02-27 NOTE — Telephone Encounter (Signed)
Noted  

## 2014-03-13 ENCOUNTER — Other Ambulatory Visit: Payer: Self-pay

## 2014-03-13 MED ORDER — AMLODIPINE BESYLATE 10 MG PO TABS: 10.0000 mg | ORAL_TABLET | Freq: Every day | ORAL | Status: DC

## 2014-03-13 MED ORDER — BENAZEPRIL HCL 20 MG PO TABS: 40.0000 mg | ORAL_TABLET | Freq: Every day | ORAL | Status: DC

## 2014-03-16 ENCOUNTER — Other Ambulatory Visit: Payer: Self-pay | Admitting: Family Medicine

## 2014-03-18 ENCOUNTER — Ambulatory Visit (INDEPENDENT_AMBULATORY_CARE_PROVIDER_SITE_OTHER): Payer: Medicare HMO | Admitting: Family Medicine

## 2014-03-18 ENCOUNTER — Other Ambulatory Visit: Payer: Self-pay

## 2014-03-18 ENCOUNTER — Encounter: Payer: Self-pay | Admitting: Family Medicine

## 2014-03-18 VITALS — BP 132/80 | HR 86 | Resp 16 | Ht 73.5 in | Wt 283.8 lb

## 2014-03-18 DIAGNOSIS — E785 Hyperlipidemia, unspecified: Secondary | ICD-10-CM

## 2014-03-18 DIAGNOSIS — E669 Obesity, unspecified: Secondary | ICD-10-CM

## 2014-03-18 DIAGNOSIS — R7309 Other abnormal glucose: Secondary | ICD-10-CM

## 2014-03-18 DIAGNOSIS — R7303 Prediabetes: Secondary | ICD-10-CM

## 2014-03-18 DIAGNOSIS — I4891 Unspecified atrial fibrillation: Secondary | ICD-10-CM

## 2014-03-18 DIAGNOSIS — I10 Essential (primary) hypertension: Secondary | ICD-10-CM

## 2014-03-18 DIAGNOSIS — Z125 Encounter for screening for malignant neoplasm of prostate: Secondary | ICD-10-CM

## 2014-03-18 DIAGNOSIS — E8881 Metabolic syndrome: Secondary | ICD-10-CM

## 2014-03-18 DIAGNOSIS — R972 Elevated prostate specific antigen [PSA]: Secondary | ICD-10-CM

## 2014-03-18 MED ORDER — BENAZEPRIL HCL 20 MG PO TABS: 40.0000 mg | ORAL_TABLET | Freq: Every day | ORAL | Status: DC

## 2014-03-18 NOTE — Patient Instructions (Signed)
Annual wellness in 5 month, call if you need me before  CBC , fasting lipid. Chem 7, hBA1C, pSA this week  It is important that you exercise regularly at least 30 minutes 5 times a week. If you develop chest pain, have severe difficulty breathing, or feel very tired, stop exercising immediately and seek medical attention    Weight loss goal of 7 to 10 pounds   Fall Prevention and Home Safety Falls cause injuries and can affect all age groups. It is possible to use preventive measures to significantly decrease the likelihood of falls. There are many simple measures which can make your home safer and prevent falls. OUTDOORS  Repair cracks and edges of walkways and driveways.  Remove high doorway thresholds.  Trim shrubbery on the main path into your home.  Have good outside lighting.  Clear walkways of tools, rocks, debris, and clutter.  Check that handrails are not broken and are securely fastened. Both sides of steps should have handrails.  Have leaves, snow, and ice cleared regularly.  Use sand or salt on walkways during winter months.  In the garage, clean up grease or oil spills. BATHROOM  Install night lights.  Install grab bars by the toilet and in the tub and shower.  Use non-skid mats or decals in the tub or shower.  Place a plastic non-slip stool in the shower to sit on, if needed.  Keep floors dry and clean up all water on the floor immediately.  Remove soap buildup in the tub or shower on a regular basis.  Secure bath mats with non-slip, double-sided rug tape.  Remove throw rugs and tripping hazards from the floors. BEDROOMS  Install night lights.  Make sure a bedside light is easy to reach.  Do not use oversized bedding.  Keep a telephone by your bedside.  Have a firm chair with side arms to use for getting dressed.  Remove throw rugs and tripping hazards from the floor. KITCHEN  Keep handles on pots and pans turned toward the center of the  stove. Use back burners when possible.  Clean up spills quickly and allow time for drying.  Avoid walking on wet floors.  Avoid hot utensils and knives.  Position shelves so they are not too high or low.  Place commonly used objects within easy reach.  If necessary, use a sturdy step stool with a grab bar when reaching.  Keep electrical cables out of the way.  Do not use floor polish or wax that makes floors slippery. If you must use wax, use non-skid floor wax.  Remove throw rugs and tripping hazards from the floor. STAIRWAYS  Never leave objects on stairs.  Place handrails on both sides of stairways and use them. Fix any loose handrails. Make sure handrails on both sides of the stairways are as long as the stairs.  Check carpeting to make sure it is firmly attached along stairs. Make repairs to worn or loose carpet promptly.  Avoid placing throw rugs at the top or bottom of stairways, or properly secure the rug with carpet tape to prevent slippage. Get rid of throw rugs, if possible.  Have an electrician put in a light switch at the top and bottom of the stairs. OTHER FALL PREVENTION TIPS  Wear low-heel or rubber-soled shoes that are supportive and fit well. Wear closed toe shoes.  When using a stepladder, make sure it is fully opened and both spreaders are firmly locked. Do not climb a closed stepladder.  Add color or contrast paint or tape to grab bars and handrails in your home. Place contrasting color strips on first and last steps.  Learn and use mobility aids as needed. Install an electrical emergency response system.  Turn on lights to avoid dark areas. Replace light bulbs that burn out immediately. Get light switches that glow.  Arrange furniture to create clear pathways. Keep furniture in the same place.  Firmly attach carpet with non-skid or double-sided tape.  Eliminate uneven floor surfaces.  Select a carpet pattern that does not visually hide the edge of  steps.  Be aware of all pets. OTHER HOME SAFETY TIPS  Set the water temperature for 120 F (48.8 C).  Keep emergency numbers on or near the telephone.  Keep smoke detectors on every level of the home and near sleeping areas. Document Released: 12/03/2002 Document Revised: 06/13/2012 Document Reviewed: 03/03/2012 [facility] Patient Information 2014 [place].

## 2014-03-18 NOTE — Progress Notes (Signed)
Subjective:    Patient ID: Carlos Leon, male    DOB: June 03, 1946, 68 y.o.   MRN: [id number]  HPI The PT is here for follow up and re-evaluation of chronic medical conditions, medication management and review of any available recent lab and radiology data.  Preventive health is updated, specifically  Cancer screening and Immunization.  Needs zostavax . The PT denies any adverse reactions to current medications since the last visit.  Feels well despite lack of regular exercise and weight gain. Denies polyuria , excessive fatigue or polydipsia Denies nocturia and reports adeqaute urinary stream Still no interest in coumadin and is willing to consider urology eval for elevated PSA if persists or worsens      Review of Systems See HPI Denies recent fever or chills. Denies sinus pressure, nasal congestion, ear pain or sore throat. Denies chest congestion, productive cough or wheezing. Denies chest pains, palpitations and leg swelling Denies abdominal pain, nausea, vomiting,diarrhea or constipation.   Denies dysuria, frequency, hesitancy or incontinence. Denies joint pain, swelling and limitation in mobility. Denies headaches, seizures, numbness, or tingling. Denies depression, anxiety or insomnia. Denies skin break down or rash.        Objective:   Physical Exam BP 132/80  Pulse 86  Resp 16  Ht 6' 1.5" (1.867 m)  Wt 283 lb 12.8 oz (128.731 kg)  BMI 36.93 kg/m2  SpO2 96% Patient alert and oriented and in no cardiopulmonary distress.  HEENT: No facial asymmetry, EOMI, no sinus tenderness,  oropharynx pink and moist.  Neck supple no adenopathy.  Chest: Clear to auscultation bilaterally.  CVS: S1, S2 no murmurs, no S3.Irregulalrly  Irregular pulse  ABD: Soft non tender. Bowel sounds normal.  Ext: No edema  MS: Adequate ROM spine, shoulders, hips and knees.  Skin: Intact, no ulcerations or rash noted.  Psych: Good eye contact, normal affect. Memory intact not  anxious or depressed appearing.  CNS: CN 2-12 intact, power, tone and sensation normal throughout.        Assessment & Plan:  Essential hypertension, benign Controlled, no change in medication DASH diet and commitment to daily physical activity for a minimum of 30 minutes discussed and encouraged, as a part of hypertension management. The importance of attaining a healthy weight is also discussed.   Atrial fibrillation Remains in a fib, continues to refuse use of anticoagulant to reduce stroke risk  Prediabetes Deteriorated Patient educated about the importance of limiting  Carbohydrate intake , the need to commit to daily physical activity for a minimum of 30 minutes , and to commit weight loss. The fact that changes in all these areas will reduce or eliminate all together the development of diabetes is stressed.     OBESITY Deteriorated. Patient re-educated about  the importance of commitment to a  minimum of 150 minutes of exercise per week. The importance of healthy food choices with portion control discussed. Encouraged to start a food diary, count calories and to consider  joining a support group. Sample diet sheets offered. Goals set by the patient for the next several months.     Elevated PSA Will rept , pt now rethinking his decision against urology f/u and will let me k now if he has changed his mind when result is available. I encourage him to see urology  HYPERLIPIDEMIA Hyperlipidemia:Low fat diet discussed and encouraged. Improved since last checked    Metabolic syndrome X The increased risk of cardiovascular disease associated with this diagnosis, and the need to  consistently work on lifestyle to change this is discussed. Following  a  heart healthy diet ,commitment to 30 minutes of exercise at least 5 days per week, as well as control of blood sugar and cholesterol , and achieving a healthy weight are all the areas to be addressed .

## 2014-03-20 LAB — HEMOGLOBIN A1C
Hgb A1c MFr Bld: 6.3 % — ABNORMAL HIGH (ref <5.7)
Mean Plasma Glucose: 134 mg/dL — ABNORMAL HIGH (ref <117)

## 2014-03-20 LAB — LIPID PANEL
CHOL/HDL RATIO: 3.9 ratio
Cholesterol: 187 mg/dL (ref 0–200)
HDL: 48 mg/dL (ref >39)
LDL CALC: 122 mg/dL — AB (ref 0–99)
TRIGLYCERIDES: 85 mg/dL (ref <150)
VLDL: 17 mg/dL (ref 0–40)

## 2014-03-20 LAB — BASIC METABOLIC PANEL
BUN: 18 mg/dL (ref 6–23)
CALCIUM: 9.3 mg/dL (ref 8.4–10.5)
CO2: 22 mEq/L (ref 19–32)
CREATININE: 1.17 mg/dL (ref 0.50–1.35)
Chloride: 106 mEq/L (ref 96–112)
GLUCOSE: 108 mg/dL — AB (ref 70–99)
Potassium: 4.5 mEq/L (ref 3.5–5.3)
SODIUM: 139 meq/L (ref 135–145)

## 2014-03-20 LAB — CBC
HCT: 38.8 % — ABNORMAL LOW (ref 39.0–52.0)
HEMOGLOBIN: 13.4 g/dL (ref 13.0–17.0)
MCH: 25.8 pg — AB (ref 26.0–34.0)
MCHC: 34.5 g/dL (ref 30.0–36.0)
MCV: 74.8 fL — AB (ref 78.0–100.0)
Platelets: 184 10*3/uL (ref 150–400)
RBC: 5.19 MIL/uL (ref 4.22–5.81)
RDW: 16 % — ABNORMAL HIGH (ref 11.5–15.5)
WBC: 6.6 10*3/uL (ref 4.0–10.5)

## 2014-03-20 LAB — PSA, MEDICARE: PSA: 9.28 ng/mL — ABNORMAL HIGH (ref <=4.00)

## 2014-04-28 DIAGNOSIS — E8881 Metabolic syndrome: Secondary | ICD-10-CM

## 2014-04-28 HISTORY — DX: Metabolic syndrome: E88.810

## 2014-04-28 HISTORY — DX: Metabolic syndrome: E88.81

## 2014-04-28 NOTE — Assessment & Plan Note (Addendum)
Hyperlipidemia:Low fat diet discussed and encouraged. Improved since last checked

## 2014-04-28 NOTE — Assessment & Plan Note (Signed)
Deteriorated Patient educated about the importance of limiting  Carbohydrate intake , the need to commit to daily physical activity for a minimum of 30 minutes , and to commit weight loss. The fact that changes in all these areas will reduce or eliminate all together the development of diabetes is stressed.    

## 2014-04-28 NOTE — Assessment & Plan Note (Signed)
Deteriorated. Patient re-educated about  the importance of commitment to a  minimum of 150 minutes of exercise per week. The importance of healthy food choices with portion control discussed. Encouraged to start a food diary, count calories and to consider  joining a support group. Sample diet sheets offered. Goals set by the patient for the next several months.    

## 2014-04-28 NOTE — Assessment & Plan Note (Signed)
Carlos Leon , pt now rethinking his decision against urology f/u and Carlos let me k now if he has changed his mind when result is available. I encourage him to see urology

## 2014-04-28 NOTE — Assessment & Plan Note (Signed)
The increased risk of cardiovascular disease associated with this diagnosis, and the need to consistently work on lifestyle to change this is discussed. Following  a  heart healthy diet ,commitment to 30 minutes of exercise at least 5 days per week, as well as control of blood sugar and cholesterol , and achieving a healthy weight are all the areas to be addressed .  

## 2014-04-28 NOTE — Assessment & Plan Note (Signed)
Remains in a fib, continues to refuse use of anticoagulant to reduce stroke risk

## 2014-04-28 NOTE — Assessment & Plan Note (Signed)
Controlled, no change in medication DASH diet and commitment to daily physical activity for a minimum of 30 minutes discussed and encouraged, as a part of hypertension management. The importance of attaining a healthy weight is also discussed.  

## 2014-05-03 ENCOUNTER — Ambulatory Visit: Payer: Medicare HMO | Admitting: Urology

## 2014-05-17 ENCOUNTER — Ambulatory Visit (INDEPENDENT_AMBULATORY_CARE_PROVIDER_SITE_OTHER): Payer: Medicare HMO | Admitting: Urology

## 2014-05-17 ENCOUNTER — Other Ambulatory Visit: Payer: Self-pay | Admitting: Urology

## 2014-05-17 DIAGNOSIS — R972 Elevated prostate specific antigen [PSA]: Secondary | ICD-10-CM

## 2014-05-17 DIAGNOSIS — N400 Enlarged prostate without lower urinary tract symptoms: Secondary | ICD-10-CM

## 2014-05-17 DIAGNOSIS — N529 Male erectile dysfunction, unspecified: Secondary | ICD-10-CM

## 2014-05-24 ENCOUNTER — Ambulatory Visit (HOSPITAL_COMMUNITY): Payer: Medicare HMO

## 2014-06-03 ENCOUNTER — Other Ambulatory Visit: Payer: Self-pay

## 2014-06-03 MED ORDER — BENAZEPRIL HCL 20 MG PO TABS: 40.0000 mg | ORAL_TABLET | Freq: Every day | ORAL | Status: DC

## 2014-06-03 MED ORDER — AMLODIPINE BESYLATE 10 MG PO TABS: 10.0000 mg | ORAL_TABLET | Freq: Every day | ORAL | Status: DC

## 2014-06-27 ENCOUNTER — Encounter: Payer: Medicare Other | Admitting: Family Medicine

## 2014-08-20 ENCOUNTER — Encounter: Payer: Self-pay | Admitting: Family Medicine

## 2014-08-20 ENCOUNTER — Ambulatory Visit (INDEPENDENT_AMBULATORY_CARE_PROVIDER_SITE_OTHER): Payer: Medicare HMO | Admitting: Family Medicine

## 2014-08-20 VITALS — BP 116/68 | HR 90 | Resp 18 | Ht 73.5 in | Wt 279.0 lb

## 2014-08-20 DIAGNOSIS — Z0000 Encounter for general adult medical examination without abnormal findings: Secondary | ICD-10-CM | POA: Insufficient documentation

## 2014-08-20 DIAGNOSIS — E785 Hyperlipidemia, unspecified: Secondary | ICD-10-CM

## 2014-08-20 DIAGNOSIS — I10 Essential (primary) hypertension: Secondary | ICD-10-CM

## 2014-08-20 DIAGNOSIS — R7303 Prediabetes: Secondary | ICD-10-CM

## 2014-08-20 DIAGNOSIS — N529 Male erectile dysfunction, unspecified: Secondary | ICD-10-CM

## 2014-08-20 DIAGNOSIS — Z23 Encounter for immunization: Secondary | ICD-10-CM

## 2014-08-20 MED ORDER — SILDENAFIL CITRATE 100 MG PO TABS: 100.0000 mg | ORAL_TABLET | Freq: Every day | ORAL | Status: DC | PRN

## 2014-08-20 NOTE — Patient Instructions (Addendum)
F/u in 4.5 month, with rectal exam, call if you need me before  Prevnar today  Increase exercise to 6 days per week, and reduce intake of cookies and sweets  Fasting lipid, cmp and EGFR and hBA1C this week please   [facility]s check [facility] for help with [facility]  aid

## 2014-08-20 NOTE — Assessment & Plan Note (Addendum)
Annual exam as documented. Counseling done  re healthy lifestyle involving commitment to 150 minutes exercise per week, heart healthy diet, and attaining healthy weight.The importance of adequate sleep also discussed. Regular seat belt use and safe storage  of firearms if patient has them, is also discussed. Changes in health habits are decided on by the patient with goals and time frames  set for achieving them. Immunization and cancer screening needs are specifically addressed at this visit.Refuses flu vaccine, but accepts prevnar. ED an issue and viagra is prescribed

## 2014-08-20 NOTE — Assessment & Plan Note (Signed)
Vaccine administered at visit.  

## 2014-08-20 NOTE — Progress Notes (Signed)
Subjective:    Patient ID: Carlos Leon, male    DOB: May 02, 1946, 68 y.o.   MRN: [id number]  HPI  Preventive Screening-Counseling & Management   Patient present here today for a subsequent  Medicare annual wellness visit. No other expressed concerns Accepted prevnar , still refusing flu vaccine   Current Problems (verified)   Medications Prior to Visit Allergies (verified)   PAST HISTORY  Family History: as noted  Social History : as noted    Risk Factors  Current exercise habits:  4 to 5 days per week, at least 30 mins   Dietary issues discussed:heart healthy, low carb, low sugar    Cardiac risk factors: chronic a fib   Depression Screen  (Note: if answer to either of the following is "Yes", a more complete depression screening is indicated)   Over the past two weeks, have you felt down, depressed or hopeless? No  Over the past two weeks, have you felt little interest or pleasure in doing things? No  Have you lost interest or pleasure in daily life? No  Do you often feel hopeless? No  Do you cry easily over simple problems? No   Activities of Daily Living  In your present state of health, do you have any difficulty performing the following activities?  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 Yes Normal Appearance?Yes  Oriented to person? Yes Place? Yes  Time? Yes  Displays appropriate judgment?Yes  Can read the correct time from a watch face? yes Are you having problems remembering things?No  Advanced Directives have been discussed with the patient?Yes, full code    List the Names of Other Physician/Practitioners you currently use: none regularly, need sot continue urology f/u for elevated PSA, has put off recommended biposy more than once, states he still wants PSA level checked regularly   Indicate any recent Medical Services you may have received from other than [facility] providers in the past year (date may be approximate).   Assessment:    Annual Wellness Exam   Plan:     Medicare Attestation  I have personally reviewed:  The patient's medical and social history  Their use of alcohol, tobacco or illicit drugs  Their current medications and supplements  The patient's functional ability including ADLs,fall risks, home safety risks, cognitive, and hearing and visual impairment  Diet and physical activities  Evidence for depression or mood disorders  The patient's weight, height, BMI, and visual acuity have been recorded in the chart. I have made referrals, counseling, and provided education to the patient based on review of the above and I have provided the patient with a written personalized care plan for preventive services.     Review of Systems     Objective:   Physical Exam  Assessment & Plan:  Medicare annual wellness visit, subsequent Annual exam as documented. Counseling done  re healthy lifestyle involving commitment to 150 minutes exercise per week, heart healthy diet, and attaining healthy weight.The importance of adequate sleep also discussed. Regular seat belt use and safe storage  of firearms if patient has them, is also discussed. Changes in health habits  are decided on by the patient with goals and time frames  set for achieving them. Immunization and cancer screening needs are specifically addressed at this visit.Refuses flu vaccine, but accepts prevnar. 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 Pt is not on a nitrate and has no CAD, viagra prescribed

## 2014-08-20 NOTE — Assessment & Plan Note (Addendum)
C/o difficulty attaining and maintaining erections. Pt is not on a nitrate and has no CAD, viagra prescribed

## 2014-08-20 NOTE — Progress Notes (Signed)
   Subjective:    Patient ID: Carlos Leon, male    DOB: 1946-12-17, 68 y.o.   MRN: [id number]  HPI Preventive Screening-Counseling & Management   Patient present here today for a Medicare annual wellness visit.   Current Problems (verified)   Medications Prior to Visit Allergies (verified)   PAST HISTORY  Family History (verified and updated)  Social History Patient is retired/married man with 4 children.     Risk Factors  Current exercise habits: Enjoys golfing and walking    Dietary issues discussed: Heart healthy low fat diet with no added salt    Cardiac risk factors:   Depression Screen  (Note: if answer to either of the following is "Yes", a more complete depression screening is indicated)   Over the past two weeks, have you felt down, depressed or hopeless? No  Over the past two weeks, have you felt little interest or pleasure in doing things? No  Have you lost interest or pleasure in daily life? No  Do you often feel hopeless? No  Do you cry easily over simple problems? No   Activities of Daily Living  In your present state of health, do you have any difficulty performing the following activities?  Driving?: No Managing money?: No Feeding yourself?:No Getting from bed to chair?:No Climbing a flight of stairs?:No Preparing food and eating?:No Bathing or showering?:No Getting dressed?:No Getting to the toilet?:No Using the toilet?:No Moving around from place to place?: No  Fall Risk Assessment In the past year have you fallen or had a near fall?:No Are you currently taking any medications that make you dizziness?:No   Hearing Difficulties: No Do you often ask people to speak up or repeat themselves?:No Do you experience ringing or noises in your ears?:No Do you have difficulty understanding soft or whispered voices?:No  Cognitive Testing  Alert? Yes Normal Appearance?Yes  Oriented to person? Yes Place? Yes  Time? Yes  Displays appropriate  judgment?Yes  Can read the correct time from a watch face? yes Are you having problems remembering things?No  Advanced Directives have been discussed with the patient?Yes, brochure given and discussed   List the Names of Other Physician/Practitioners you currently use: updated    Indicate any recent Medical Services you may have received from other than [facility] providers in the past year (date may be approximate).   Assessment:    Annual Wellness Exam   Plan:    Medicare Attestation  I have personally reviewed:  The patient's medical and social history  Their use of alcohol, tobacco or illicit drugs  Their current medications and supplements  The patient's functional ability including ADLs,fall risks, home safety risks, cognitive, and hearing and visual impairment  Diet and physical activities  Evidence for depression or mood disorders  The patient's weight, height, BMI, and visual acuity have been recorded in the chart. I have made referrals, counseling, and provided education to the patient based on review of the above and I have provided the patient with a written personalized care plan for preventive services.     Review of Systems     Objective:   Physical Exam        Assessment & Plan:

## 2014-09-03 LAB — COMPLETE METABOLIC PANEL WITH GFR
ALBUMIN: 4.1 g/dL (ref 3.5–5.2)
ALK PHOS: 102 U/L (ref 39–117)
ALT: 22 U/L (ref 0–53)
AST: 21 U/L (ref 0–37)
BUN: 18 mg/dL (ref 6–23)
CO2: 26 meq/L (ref 19–32)
Calcium: 9.4 mg/dL (ref 8.4–10.5)
Chloride: 106 mEq/L (ref 96–112)
Creat: 1.41 mg/dL — ABNORMAL HIGH (ref 0.50–1.35)
GFR, EST AFRICAN AMERICAN: 59 mL/min — AB
GFR, Est Non African American: 51 mL/min — ABNORMAL LOW
GLUCOSE: 115 mg/dL — AB (ref 70–99)
POTASSIUM: 4.4 meq/L (ref 3.5–5.3)
SODIUM: 139 meq/L (ref 135–145)
TOTAL PROTEIN: 6.6 g/dL (ref 6.0–8.3)
Total Bilirubin: 0.4 mg/dL (ref 0.2–1.2)

## 2014-09-03 LAB — HEMOGLOBIN A1C
Hgb A1c MFr Bld: 6.3 % — ABNORMAL HIGH (ref <5.7)
MEAN PLASMA GLUCOSE: 134 mg/dL — AB (ref <117)

## 2014-09-03 LAB — LIPID PANEL
CHOL/HDL RATIO: 4 ratio
CHOLESTEROL: 185 mg/dL (ref 0–200)
HDL: 46 mg/dL (ref >39)
LDL CALC: 117 mg/dL — AB (ref 0–99)
Triglycerides: 110 mg/dL (ref <150)
VLDL: 22 mg/dL (ref 0–40)

## 2014-09-10 NOTE — Addendum Note (Signed)
Addended by: Denman George B on: 09/10/2014 11:30 AM   Modules accepted: Orders

## 2014-11-25 ENCOUNTER — Other Ambulatory Visit: Payer: Self-pay

## 2014-11-25 MED ORDER — AMLODIPINE BESYLATE 10 MG PO TABS: 10.0000 mg | ORAL_TABLET | Freq: Every day | ORAL | Status: DC

## 2014-11-25 MED ORDER — BENAZEPRIL HCL 20 MG PO TABS: 40.0000 mg | ORAL_TABLET | Freq: Every day | ORAL | Status: DC

## 2014-12-23 ENCOUNTER — Ambulatory Visit: Payer: Medicare HMO | Admitting: Family Medicine

## 2015-01-22 ENCOUNTER — Ambulatory Visit: Payer: Medicare HMO | Admitting: Family Medicine

## 2015-01-30 ENCOUNTER — Encounter: Payer: Self-pay | Admitting: Family Medicine

## 2015-01-30 ENCOUNTER — Ambulatory Visit (INDEPENDENT_AMBULATORY_CARE_PROVIDER_SITE_OTHER): Payer: Commercial Managed Care - HMO | Admitting: Family Medicine

## 2015-01-30 VITALS — BP 138/82 | HR 76 | Resp 18 | Ht 73.5 in | Wt 285.0 lb

## 2015-01-30 DIAGNOSIS — J3089 Other allergic rhinitis: Secondary | ICD-10-CM

## 2015-01-30 DIAGNOSIS — Z1211 Encounter for screening for malignant neoplasm of colon: Secondary | ICD-10-CM | POA: Diagnosis not present

## 2015-01-30 DIAGNOSIS — E785 Hyperlipidemia, unspecified: Secondary | ICD-10-CM

## 2015-01-30 DIAGNOSIS — Z0000 Encounter for general adult medical examination without abnormal findings: Secondary | ICD-10-CM | POA: Diagnosis not present

## 2015-01-30 DIAGNOSIS — R7303 Prediabetes: Secondary | ICD-10-CM

## 2015-01-30 DIAGNOSIS — R972 Elevated prostate specific antigen [PSA]: Secondary | ICD-10-CM

## 2015-01-30 DIAGNOSIS — J309 Allergic rhinitis, unspecified: Secondary | ICD-10-CM | POA: Insufficient documentation

## 2015-01-30 LAB — POC HEMOCCULT BLD/STL (OFFICE/1-CARD/DIAGNOSTIC): Fecal Occult Blood, POC: NEGATIVE

## 2015-01-30 MED ORDER — MOMETASONE FUROATE 50 MCG/ACT NA SUSP: 2.0000 | Freq: Every day | NASAL | Status: DC

## 2015-01-30 NOTE — Progress Notes (Signed)
   Subjective:    Patient ID: Carlos Leon, male    DOB: January 22, 1946, 69 y.o.   MRN: [id number]  HPI Patient is in for annual physical exam No other health concerns are expressed or addressed at the visit.    Review of Systems See HPI     Objective:   Physical Exam  BP 138/82 mmHg  Pulse 76  Resp 18  Ht 6' 1.5" (1.867 m)  Wt 285 lb (129.275 kg)  BMI 37.09 kg/m2  SpO2 98%   Pleasant obese male, alert and oriented x 3, in no cardio-pulmonary distress. Afebrile. HEENT No facial trauma or asymetry. Sinuses non tender. EOMI, PERTL. External ears normal, tympanic membranes clear. Oropharynx moist, no exudate,fair  dentition with dentures Neck: supple, no adenopathy,JVD or thyromegaly.No bruits.  Chest: Clear to ascultation bilaterally.No crackles or wheezes. Non tender to palpation  Breast: No asymetry,no masses. No nipple discharge or inversion. No axillary or supraclavicular adenopathy  Cardiovascular system; Heart sounds normal,  S1 and  S2 ,no S3.  No murmur, or thrill.Irregularly irregular heart rate. Apical beat not displaced Peripheral pulses normal.  Abdomen: Soft, non tender, no organomegaly or masses. No bruits. Bowel sounds normal. No guarding, tenderness or rebound.  Rectal:  Normal sphincter tone. No hemorrhoids or  masses. guaiac negative stool. Prostate enlarged, firm and feels nodular  GU: No penile lesion or discharge. No testicular mass or tenderness.  Musculoskeletal exam: Full ROM of spine, hips , shoulders and knees. No deformity ,swelling or crepitus noted. No muscle wasting or atrophy.   Neurologic: Cranial nerves 2 to 12 intact. Power, tone ,sensation and reflexes normal throughout. No disturbance in gait. No tremor.  Skin: Intact, no ulceration, erythema , scaling or rash noted. Pigmentation normal throughout  Psych; Normal mood and affect. Judgement and concentration normal    .      Assessment & Plan:  Annual  physical exam Annual exam as documented. Counseling done  re healthy lifestyle involving commitment to 150 minutes exercise per week, heart healthy diet, and attaining healthy weight.The importance of adequate sleep also discussed. Regular seat belt use and home safety, is also discussed. Changes in health habits are decided on by the patient with goals and time frames  set for achieving them. Immunization and cancer screening needs are specifically addressed at this visit.    Elevated PSA Pt wants no blood testing done on his prostate. He has been advised of the need for biopsy by urology on more than one occasion in the past, as well as by myself He maintains that he does not want further testing or any biopsy now as he feels "if it aint broke, don't fix it" He is aware of the concern for prostate cancer but wants to hold off on any testing at this time. Will re address in 1 year in 1 year , unless he brings this up    Special screening for malignant neoplasms, colon Heme negative stool

## 2015-01-30 NOTE — Patient Instructions (Addendum)
Annual wellness August 26 or after, call if you need me before  Please continue to work on healthy diet and regular  Exercise  Commit to smaller food portions and less ice cream so that you do lose weight  No hidden blood in stool , which is good   Fasting lipid, cmp and HBa1C in the next week please  Try to fill the medication at [facility], may be more affordable

## 2015-01-30 NOTE — Assessment & Plan Note (Signed)

## 2015-02-01 DIAGNOSIS — Z1211 Encounter for screening for malignant neoplasm of colon: Secondary | ICD-10-CM | POA: Insufficient documentation

## 2015-02-01 NOTE — Assessment & Plan Note (Signed)
Pt wants no blood testing done on his prostate. He has been advised of the need for biopsy by urology on more than one occasion in the past, as well as by myself He maintains that he does not want further testing or any biopsy now as he feels "if it aint broke, don't fix it" He is aware of the concern for prostate cancer but wants to hold off on any testing at this time. Will re address in 1 year in 1 year , unless he brings this up

## 2015-02-01 NOTE — Assessment & Plan Note (Signed)
Heme negative stool 

## 2015-02-03 DIAGNOSIS — E785 Hyperlipidemia, unspecified: Secondary | ICD-10-CM | POA: Diagnosis not present

## 2015-02-03 DIAGNOSIS — R7309 Other abnormal glucose: Secondary | ICD-10-CM | POA: Diagnosis not present

## 2015-02-03 LAB — HEMOGLOBIN A1C
HEMOGLOBIN A1C: 6.2 % — AB (ref <5.7)
MEAN PLASMA GLUCOSE: 131 mg/dL — AB (ref <117)

## 2015-02-03 LAB — COMPREHENSIVE METABOLIC PANEL
ALBUMIN: 4.2 g/dL (ref 3.5–5.2)
ALT: 21 U/L (ref 0–53)
AST: 16 U/L (ref 0–37)
Alkaline Phosphatase: 101 U/L (ref 39–117)
BUN: 18 mg/dL (ref 6–23)
CHLORIDE: 105 meq/L (ref 96–112)
CO2: 26 mEq/L (ref 19–32)
CREATININE: 1.26 mg/dL (ref 0.50–1.35)
Calcium: 9.3 mg/dL (ref 8.4–10.5)
Glucose, Bld: 113 mg/dL — ABNORMAL HIGH (ref 70–99)
POTASSIUM: 4.4 meq/L (ref 3.5–5.3)
SODIUM: 141 meq/L (ref 135–145)
Total Bilirubin: 0.6 mg/dL (ref 0.2–1.2)
Total Protein: 6.8 g/dL (ref 6.0–8.3)

## 2015-02-03 LAB — LIPID PANEL
CHOL/HDL RATIO: 4.5 ratio
Cholesterol: 208 mg/dL — ABNORMAL HIGH (ref 0–200)
HDL: 46 mg/dL (ref >39)
LDL Cholesterol: 140 mg/dL — ABNORMAL HIGH (ref 0–99)
Triglycerides: 112 mg/dL (ref <150)
VLDL: 22 mg/dL (ref 0–40)

## 2015-03-11 ENCOUNTER — Other Ambulatory Visit: Payer: Self-pay

## 2015-03-11 MED ORDER — AMLODIPINE BESYLATE 10 MG PO TABS: 10.0000 mg | ORAL_TABLET | Freq: Every day | ORAL | Status: DC

## 2015-03-12 ENCOUNTER — Other Ambulatory Visit: Payer: Self-pay

## 2015-03-12 MED ORDER — BENAZEPRIL HCL 20 MG PO TABS: 40.0000 mg | ORAL_TABLET | Freq: Every day | ORAL | Status: DC

## 2015-03-20 ENCOUNTER — Telehealth: Payer: Self-pay | Admitting: *Deleted

## 2015-03-20 DIAGNOSIS — R972 Elevated prostate specific antigen [PSA]: Secondary | ICD-10-CM

## 2015-03-20 DIAGNOSIS — Z125 Encounter for screening for malignant neoplasm of prostate: Secondary | ICD-10-CM

## 2015-03-20 NOTE — Telephone Encounter (Addendum)
Pt came in requesting to be referred to another Urologist, he does not want to see Dr. Jeffie Pollock anymore. Please advise

## 2015-03-24 DIAGNOSIS — Z125 Encounter for screening for malignant neoplasm of prostate: Secondary | ICD-10-CM | POA: Diagnosis not present

## 2015-03-24 NOTE — Telephone Encounter (Addendum)
pls verify with opt why he wants to go to urologist,  He has had elevated PSA for which biopsy has been recommended and he has refused,  If he wants to check prostate now , refer to Dr Blair Dolphin here if he will see him, will need to check the office , or refer to [facility] urology [facility] office to any Proviider who will see hiom there unless pt has the name of a specific Doc he wants If he wants urology for some other reason, pls let me know  If it is for his prostate, needs updated PSA   I expect to get response so I "know what is going on"  Thanks  ??pls ask

## 2015-03-24 NOTE — Addendum Note (Signed)
Addended by: Eual Fines on: 03/24/2015 01:14 PM   Modules accepted: Orders

## 2015-03-24 NOTE — Telephone Encounter (Signed)
Patient does want prostate checked and will go have labwork done

## 2015-03-27 LAB — PSA, MEDICARE: PSA: 13.44 ng/mL — ABNORMAL HIGH (ref <=4.00)

## 2015-03-27 NOTE — Telephone Encounter (Signed)
Will refer and [facility] and is willing to have his prostate biopsied now and will be compliant with treatment

## 2015-03-27 NOTE — Addendum Note (Signed)
Addended by: Eual Fines on: 03/27/2015 02:27 PM   Modules accepted: Orders

## 2015-04-28 DIAGNOSIS — R3915 Urgency of urination: Secondary | ICD-10-CM | POA: Diagnosis not present

## 2015-04-28 DIAGNOSIS — R972 Elevated prostate specific antigen [PSA]: Secondary | ICD-10-CM | POA: Diagnosis not present

## 2015-04-28 DIAGNOSIS — N401 Enlarged prostate with lower urinary tract symptoms: Secondary | ICD-10-CM | POA: Diagnosis not present

## 2015-04-28 DIAGNOSIS — R351 Nocturia: Secondary | ICD-10-CM | POA: Diagnosis not present

## 2015-06-14 ENCOUNTER — Other Ambulatory Visit: Payer: Self-pay | Admitting: Family Medicine

## 2015-06-19 ENCOUNTER — Other Ambulatory Visit: Payer: Self-pay | Admitting: Family Medicine

## 2015-08-25 ENCOUNTER — Encounter: Payer: Medicare HMO | Admitting: Family Medicine

## 2015-08-25 ENCOUNTER — Encounter: Payer: Self-pay | Admitting: *Deleted

## 2015-09-15 ENCOUNTER — Other Ambulatory Visit: Payer: Self-pay | Admitting: Family Medicine

## 2015-09-15 ENCOUNTER — Encounter: Payer: Self-pay | Admitting: Family Medicine

## 2015-09-15 ENCOUNTER — Ambulatory Visit (INDEPENDENT_AMBULATORY_CARE_PROVIDER_SITE_OTHER): Payer: Medicare HMO | Admitting: Family Medicine

## 2015-09-15 VITALS — BP 138/82 | HR 88 | Resp 16 | Ht 74.0 in | Wt 285.0 lb

## 2015-09-15 DIAGNOSIS — R972 Elevated prostate specific antigen [PSA]: Secondary | ICD-10-CM

## 2015-09-15 DIAGNOSIS — E8881 Metabolic syndrome: Secondary | ICD-10-CM | POA: Diagnosis not present

## 2015-09-15 DIAGNOSIS — I482 Chronic atrial fibrillation, unspecified: Secondary | ICD-10-CM

## 2015-09-15 DIAGNOSIS — Z91199 Patient's noncompliance with other medical treatment and regimen due to unspecified reason: Secondary | ICD-10-CM

## 2015-09-15 DIAGNOSIS — R7309 Other abnormal glucose: Secondary | ICD-10-CM | POA: Diagnosis not present

## 2015-09-15 DIAGNOSIS — I10 Essential (primary) hypertension: Secondary | ICD-10-CM | POA: Diagnosis not present

## 2015-09-15 DIAGNOSIS — E785 Hyperlipidemia, unspecified: Secondary | ICD-10-CM | POA: Diagnosis not present

## 2015-09-15 DIAGNOSIS — R7303 Prediabetes: Secondary | ICD-10-CM

## 2015-09-15 DIAGNOSIS — Z9119 Patient's noncompliance with other medical treatment and regimen: Secondary | ICD-10-CM | POA: Insufficient documentation

## 2015-09-15 NOTE — Assessment & Plan Note (Signed)
Deteriorated. Patient re-educated about  the importance of commitment to a  minimum of 150 minutes of exercise per week.  The importance of healthy food choices with portion control discussed. Encouraged to start a food diary, count calories and to consider  joining a support group. Sample diet sheets offered. Goals set by the patient for the next several months.   Weight /BMI 09/15/2015 01/30/2015 08/20/2014  WEIGHT 285 lb 285 lb 279 lb 0.6 oz  HEIGHT 6\' 2"  6' 1.5" 6' 1.5"  BMI 36.58 kg/m2 37.09 kg/m2 36.31 kg/m2    Current exercise per week 90 minutes.

## 2015-09-15 NOTE — Assessment & Plan Note (Signed)
Chronic and unchanged , refuses anti coagullant and is aware of stroke risk

## 2015-09-15 NOTE — Assessment & Plan Note (Signed)
Patient educated about the importance of limiting  Carbohydrate intake , the need to commit to daily physical activity for a minimum of 30 minutes , and to commit weight loss. The fact that changes in all these areas will reduce or eliminate all together the development of diabetes is stressed.   Diabetic Labs Latest Ref Rng 02/03/2015 09/03/2014 03/20/2014 07/09/2013 11/17/2012  HbA1c <5.7 % 6.2(H) 6.3(H) 6.3(H) 5.9(H) 6.0(H)  Microalbumin 0.00-1.89 mg/dL - - - - -  Micro/Creat Ratio 0.0-30.0 mg/g - - - - -  Chol 0 - 200 mg/dL 208(H) 185 187 196 -  HDL >39 mg/dL 46 46 48 43 -  Calc LDL 0 - 99 mg/dL 140(H) 117(H) 122(H) 130(H) -  Triglycerides <150 mg/dL 112 110 85 115 -  Creatinine 0.50 - 1.35 mg/dL 1.26 1.41(H) 1.17 1.22 1.19   BP/Weight 09/15/2015 01/30/2015 08/20/2014 03/18/2014 08/15/2013 08/06/2013 04/09/2439  Systolic BP 102 725 366 440 347 425 956  Diastolic BP 82 82 68 80 91 90 80  Wt. (Lbs) 285 285 279.04 283.8 274 274.75 276  BMI 36.58 37.09 36.31 36.93 35.16 35.75 35.92   No flowsheet data found.

## 2015-09-15 NOTE — Assessment & Plan Note (Signed)
Pt needs to continue to follow through with urology, he needed a biopsy over 1 year ago, states he will schedule f/u

## 2015-09-15 NOTE — Progress Notes (Signed)
Subjective:    Patient ID: Carlos Leon, male    DOB: 02-07-1946, 69 y.o.   MRN: [id number]  HPI   Carlos Leon     MRN: [id number]      DOB: [birth year]   HPI Carlos Leon is here for follow up and re-evaluation of chronic medical conditions, medication management and review of any available recent lab and radiology data.  Preventive health is updated, specifically  Cancer screening and Immunization.   Questions or concerns regarding consultations or procedures which the PT has had in the interim are  Addressed.Needs to return to urology he is seeing , still has not had biopsy, was prescribed medication which he did not take in full, states he will schedule and return. I advised he needed biopsy The PT denies any adverse reactions to current medications since the last visit.  There are no new concerns. Plsans to commit more to exercise and weight loss , recently lost a friend to MI on golf field, current exercise is 3 days per week  ROS Denies recent fever or chills. Denies sinus pressure, nasal congestion, ear pain or sore throat. Denies chest congestion, productive cough or wheezing. Denies chest pains, palpitations and leg swelling Denies abdominal pain, nausea, vomiting,diarrhea or constipation.   Denies dysuria, frequency, hesitancy or incontinence. Denies joint pain, swelling and limitation in mobility. Denies headaches, seizures, numbness, or tingling. Denies depression, anxiety or insomnia. Denies skin break down or rash.   PE  BP 138/82 mmHg  Pulse 88  Resp 16  Ht 6\' 2"  (1.88 m)  Wt 285 lb (129.275 kg)  BMI 36.58 kg/m2  SpO2 98%  Patient alert and oriented and in no cardiopulmonary distress.  HEENT: No facial asymmetry, EOMI,   oropharynx pink and moist.  Neck supple no JVD, no mass.  Chest: Clear to auscultation bilaterally.  CVS: S1, S2 no murmurs, no S3, Irregularly  , irregular pulse  ABD: Soft non tender.   Ext: No edema  MS: Adequate ROM spine,  shoulders, hips and knees.  Skin: Intact, no ulcerations or rash noted.  Psych: Good eye contact, normal affect. Memory intact not anxious or depressed appearing.  CNS: CN 2-12 intact, power,  normal throughout.no focal deficits noted.   Assessment & Plan   Essential hypertension, benign Controlled, no change in medication DASH diet and commitment to daily physical activity for a minimum of 30 minutes discussed and encouraged, as a part of hypertension management. The importance of attaining a healthy weight is also discussed.  BP/Weight 09/15/2015 01/30/2015 08/20/2014 03/18/2014 08/15/2013 08/06/2013 02/19/8249  Systolic BP 037 048 889 169 450 388 828  Diastolic BP 82 82 68 80 91 90 80  Wt. (Lbs) 285 285 279.04 283.8 274 274.75 276  BMI 36.58 37.09 36.31 36.93 35.16 35.75 35.92        Atrial fibrillation Chronic and unchanged , refuses anti coagullant and is aware of stroke risk  Hyperlipemia Hyperlipidemia:Low fat diet discussed and encouraged.   Lipid Panel  Lab Results  Component Value Date   CHOL 208* 02/03/2015   HDL 46 02/03/2015   LDLCALC 140* 02/03/2015   LDLDIRECT 113* 11/17/2012   TRIG 112 02/03/2015   CHOLHDL 4.5 02/03/2015   Updated lab needed at/ before next visit.'      Prediabetes Patient educated about the importance of limiting  Carbohydrate intake , the need to commit to daily physical activity for a minimum of 30 minutes , and to commit weight loss. The fact that changes  in all these areas will reduce or eliminate all together the development of diabetes is stressed.   Diabetic Labs Latest Ref Rng 02/03/2015 09/03/2014 03/20/2014 07/09/2013 11/17/2012  HbA1c <5.7 % 6.2(H) 6.3(H) 6.3(H) 5.9(H) 6.0(H)  Microalbumin 0.00-1.89 mg/dL - - - - -  Micro/Creat Ratio 0.0-30.0 mg/g - - - - -  Chol 0 - 200 mg/dL 208(H) 185 187 196 -  HDL >39 mg/dL 46 46 48 43 -  Calc LDL 0 - 99 mg/dL 140(H) 117(H) 122(H) 130(H) -  Triglycerides <150 mg/dL 112 110 85 115 -    Creatinine 0.50 - 1.35 mg/dL 1.26 1.41(H) 1.17 1.22 1.19   BP/Weight 09/15/2015 01/30/2015 08/20/2014 03/18/2014 08/15/2013 08/06/2013 9/67/8938  Systolic BP 101 751 025 852 778 242 353  Diastolic BP 82 82 68 80 91 90 80  Wt. (Lbs) 285 285 279.04 283.8 274 274.75 276  BMI 36.58 37.09 36.31 36.93 35.16 35.75 35.92   No flowsheet data found.     Elevated PSA Pt needs to continue to follow through with urology, he needed a biopsy over 1 year ago, states he will schedule f/u  Metabolic syndrome X The increased risk of cardiovascular disease associated with this diagnosis, and the need to consistently work on lifestyle to change this is discussed. Following  a  heart healthy diet ,commitment to 30 minutes of exercise at least 5 days per week, as well as control of blood sugar and cholesterol , and achieving a healthy weight are all the areas to be addressed .   Morbid obesity Deteriorated. Patient re-educated about  the importance of commitment to a  minimum of 150 minutes of exercise per week.  The importance of healthy food choices with portion control discussed. Encouraged to start a food diary, count calories and to consider  joining a support group. Sample diet sheets offered. Goals set by the patient for the next several months.   Weight /BMI 09/15/2015 01/30/2015 08/20/2014  WEIGHT 285 lb 285 lb 279 lb 0.6 oz  HEIGHT 6\' 2"  6' 1.5" 6' 1.5"  BMI 36.58 kg/m2 37.09 kg/m2 36.31 kg/m2    Current exercise per week 90 minutes.   Non compliance with medical treatment Refuses flu vaccine, needs prostate evaluation in full, and has refused coumadin. Will continue to work on pt education and re education with a hope of implementing positive change      Review of Systems     Objective:   Physical Exam        Assessment & Plan:

## 2015-09-15 NOTE — Patient Instructions (Addendum)
Annual wellness in early January, call if you need me before  Reconsider flu vaccine, this will help to protect you  Please schedule and keep f/u with urology as we discuussed re the prostate  Increase vegetables, reduce the sweets and smaller portions , eat regularly    Fasting lipid, cmp and EGFr, hBa1C, cBc and TSH

## 2015-09-15 NOTE — Assessment & Plan Note (Signed)
Refuses flu vaccine, needs prostate evaluation in full, and has refused coumadin. Will continue to work on pt education and re education with a hope of implementing positive change

## 2015-09-15 NOTE — Assessment & Plan Note (Signed)
Hyperlipidemia:Low fat diet discussed and encouraged.   Lipid Panel  Lab Results  Component Value Date   CHOL 208* 02/03/2015   HDL 46 02/03/2015   LDLCALC 140* 02/03/2015   LDLDIRECT 113* 11/17/2012   TRIG 112 02/03/2015   CHOLHDL 4.5 02/03/2015   Updated lab needed at/ before next visit.'

## 2015-09-15 NOTE — Assessment & Plan Note (Signed)
The increased risk of cardiovascular disease associated with this diagnosis, and the need to consistently work on lifestyle to change this is discussed. Following  a  heart healthy diet ,commitment to 30 minutes of exercise at least 5 days per week, as well as control of blood sugar and cholesterol , and achieving a healthy weight are all the areas to be addressed .  

## 2015-09-15 NOTE — Assessment & Plan Note (Signed)
Controlled, no change in medication DASH diet and commitment to daily physical activity for a minimum of 30 minutes discussed and encouraged, as a part of hypertension management. The importance of attaining a healthy weight is also discussed.  BP/Weight 09/15/2015 01/30/2015 08/20/2014 03/18/2014 08/15/2013 08/06/2013 1/44/3601  Systolic BP 658 006 349 494 473 958 441  Diastolic BP 82 82 68 80 91 90 80  Wt. (Lbs) 285 285 279.04 283.8 274 274.75 276  BMI 36.58 37.09 36.31 36.93 35.16 35.75 35.92

## 2015-10-20 DIAGNOSIS — N401 Enlarged prostate with lower urinary tract symptoms: Secondary | ICD-10-CM | POA: Diagnosis not present

## 2015-10-20 DIAGNOSIS — N5203 Combined arterial insufficiency and corporo-venous occlusive erectile dysfunction: Secondary | ICD-10-CM | POA: Diagnosis not present

## 2015-10-20 DIAGNOSIS — R351 Nocturia: Secondary | ICD-10-CM | POA: Diagnosis not present

## 2015-10-20 DIAGNOSIS — R3915 Urgency of urination: Secondary | ICD-10-CM | POA: Diagnosis not present

## 2015-10-20 DIAGNOSIS — R972 Elevated prostate specific antigen [PSA]: Secondary | ICD-10-CM | POA: Diagnosis not present

## 2015-10-28 DIAGNOSIS — N5203 Combined arterial insufficiency and corporo-venous occlusive erectile dysfunction: Secondary | ICD-10-CM | POA: Diagnosis not present

## 2015-10-28 DIAGNOSIS — N401 Enlarged prostate with lower urinary tract symptoms: Secondary | ICD-10-CM | POA: Diagnosis not present

## 2015-10-28 DIAGNOSIS — R972 Elevated prostate specific antigen [PSA]: Secondary | ICD-10-CM | POA: Diagnosis not present

## 2015-11-13 DIAGNOSIS — N411 Chronic prostatitis: Secondary | ICD-10-CM | POA: Diagnosis not present

## 2015-11-13 DIAGNOSIS — R972 Elevated prostate specific antigen [PSA]: Secondary | ICD-10-CM | POA: Diagnosis not present

## 2015-11-27 DIAGNOSIS — R3915 Urgency of urination: Secondary | ICD-10-CM | POA: Diagnosis not present

## 2015-11-27 DIAGNOSIS — R972 Elevated prostate specific antigen [PSA]: Secondary | ICD-10-CM | POA: Diagnosis not present

## 2016-01-01 ENCOUNTER — Other Ambulatory Visit: Payer: Self-pay | Admitting: Family Medicine

## 2016-04-12 ENCOUNTER — Other Ambulatory Visit: Payer: Self-pay | Admitting: Family Medicine

## 2016-04-19 ENCOUNTER — Other Ambulatory Visit: Payer: Self-pay

## 2016-04-19 MED ORDER — AMLODIPINE BESYLATE 10 MG PO TABS: 10.0000 mg | ORAL_TABLET | Freq: Every day | ORAL | Status: DC

## 2016-04-19 MED ORDER — BENAZEPRIL HCL 20 MG PO TABS: 40.0000 mg | ORAL_TABLET | Freq: Every day | ORAL | Status: DC

## 2016-05-20 ENCOUNTER — Encounter: Payer: Self-pay | Admitting: Family Medicine

## 2016-05-20 ENCOUNTER — Ambulatory Visit (INDEPENDENT_AMBULATORY_CARE_PROVIDER_SITE_OTHER): Payer: Commercial Managed Care - HMO | Admitting: Family Medicine

## 2016-05-20 VITALS — BP 120/80 | HR 94 | Resp 18 | Ht 74.0 in | Wt 278.0 lb

## 2016-05-20 DIAGNOSIS — I10 Essential (primary) hypertension: Secondary | ICD-10-CM

## 2016-05-20 DIAGNOSIS — H109 Unspecified conjunctivitis: Secondary | ICD-10-CM | POA: Insufficient documentation

## 2016-05-20 DIAGNOSIS — Z0000 Encounter for general adult medical examination without abnormal findings: Secondary | ICD-10-CM | POA: Insufficient documentation

## 2016-05-20 DIAGNOSIS — R7303 Prediabetes: Secondary | ICD-10-CM

## 2016-05-20 DIAGNOSIS — E785 Hyperlipidemia, unspecified: Secondary | ICD-10-CM | POA: Diagnosis not present

## 2016-05-20 DIAGNOSIS — H547 Unspecified visual loss: Secondary | ICD-10-CM

## 2016-05-20 DIAGNOSIS — Z1159 Encounter for screening for other viral diseases: Secondary | ICD-10-CM

## 2016-05-20 DIAGNOSIS — R7309 Other abnormal glucose: Secondary | ICD-10-CM | POA: Diagnosis not present

## 2016-05-20 DIAGNOSIS — R972 Elevated prostate specific antigen [PSA]: Secondary | ICD-10-CM

## 2016-05-20 DIAGNOSIS — Z1211 Encounter for screening for malignant neoplasm of colon: Secondary | ICD-10-CM

## 2016-05-20 LAB — HEMOCCULT GUIAC POC 1CARD (OFFICE): Fecal Occult Blood, POC: NEGATIVE

## 2016-05-20 MED ORDER — OLOPATADINE HCL 0.1 % OP SOLN: 1.0000 [drp] | Freq: Two times a day (BID) | OPHTHALMIC | Status: DC

## 2016-05-20 NOTE — Patient Instructions (Signed)
Annual; wellness in 5 month, call if you need me sooner  CONGRATS on excellent blood pressure and exam  Keep up new eating habits  Patanol eye drop sent for runny eyes, and you are referred to Dr Montez Morita today  Thank you  for choosing [facility]. We consider it a privelige to serve you.  Delivering excellent health care in a caring and  compassionate way is our goal.  Partnering with you,  so that together we can achieve this goal is our strategy.

## 2016-05-20 NOTE — Progress Notes (Signed)
   Subjective:    Patient ID: Carlos Leon, male    DOB: 19-Jan-1946, 70 y.o.   MRN: [id number]  HPI Patient is in for annual physical exam. C/o itchy watery eyes , with clear drainage x 3 weeks Recent labs, if available are reviewed. Immunization is reviewed , and  updated if needed.   Review of Systems See HPI     Objective:   Physical Exam  BP 120/80 mmHg  Pulse 94  Resp 18  Ht 6\' 2"  (1.88 m)  Wt 278 lb (126.1 kg)  BMI 35.68 kg/m2  SpO2 98%  Pleasant well nourished male, alert and oriented x 3, in no cardio-pulmonary distress. Afebrile. HEENT No facial trauma or asymetry. Sinuses non tender. EOMI, PERTL, bilateral conjunctival injection with clear watery drainage External ears normal, tympanic membranes clear. Oropharynx moist, no exudate, upper and lower dentures. Neck: supple, no adenopathy,JVD or thyromegaly.No bruits.  Chest: Clear to ascultation bilaterally.No crackles or wheezes. Non tender to palpation  Breast: No asymetry,no masses. No nipple discharge or inversion. No axillary or supraclavicular adenopathy  Cardiovascular system; Irregularly , irregular heart rate. Heart sounds S1 and s2 , no S3 , no murmur Apical beat not displaced Peripheral pulses normal.  Abdomen: Soft, non tender, no organomegaly or masses. No bruits. Bowel sounds normal. No guarding, tenderness or rebound.  Rectal:  Normal sphincter tone. No hemorrhoids or  masses. guaiac negative stool. Prostate smooth and firm  GU: Not examined  Musculoskeletal exam: Full ROM of spine, hips , shoulders and knees. No deformity ,swelling or crepitus noted. No muscle wasting or atrophy.   Neurologic: Cranial nerves 2 to 12 intact. Power, tone ,sensation and reflexes normal throughout. No disturbance in gait. No tremor.  Skin: Intact, no ulceration, erythema , scaling or rash noted. Pigmentation normal throughout  Psych; Normal mood and affect. Judgement and concentration  normal        Assessment & Plan:  Annual physical exam Annual exam as documented. Counseling done  re healthy lifestyle involving commitment to 150 minutes exercise per week, heart healthy diet, and attaining healthy weight.The importance of adequate sleep also discussed. Regular seat belt use and home safety, is also discussed. Changes in health habits are decided on by the patient with goals and time frames  set for achieving them. Immunization and cancer screening needs are specifically addressed at this visit.   Conjunctivitis Allergic conjunctivitis  Script sent for patanol

## 2016-05-21 ENCOUNTER — Other Ambulatory Visit: Payer: Self-pay

## 2016-05-21 ENCOUNTER — Encounter: Payer: Self-pay | Admitting: Family Medicine

## 2016-05-21 LAB — CBC
HEMATOCRIT: 41.1 % (ref 38.5–50.0)
HEMOGLOBIN: 13.8 g/dL (ref 13.2–17.1)
MCH: 26.2 pg — ABNORMAL LOW (ref 27.0–33.0)
MCHC: 33.6 g/dL (ref 32.0–36.0)
MCV: 78.1 fL — AB (ref 80.0–100.0)
MPV: 10.3 fL (ref 7.5–12.5)
Platelets: 200 10*3/uL (ref 140–400)
RBC: 5.26 MIL/uL (ref 4.20–5.80)
RDW: 16 % — AB (ref 11.0–15.0)
WBC: 8.8 10*3/uL (ref 3.8–10.8)

## 2016-05-21 LAB — HEPATITIS C ANTIBODY: HCV Ab: NEGATIVE

## 2016-05-21 LAB — COMPLETE METABOLIC PANEL WITH GFR
ALBUMIN: 4 g/dL (ref 3.6–5.1)
ALK PHOS: 98 U/L (ref 40–115)
ALT: 21 U/L (ref 9–46)
AST: 17 U/L (ref 10–35)
BUN: 20 mg/dL (ref 7–25)
CALCIUM: 9 mg/dL (ref 8.6–10.3)
CHLORIDE: 104 mmol/L (ref 98–110)
CO2: 24 mmol/L (ref 20–31)
CREATININE: 1.35 mg/dL — AB (ref 0.70–1.25)
GFR, EST AFRICAN AMERICAN: 61 mL/min (ref >=60)
GFR, Est Non African American: 53 mL/min — ABNORMAL LOW (ref >=60)
Glucose, Bld: 94 mg/dL (ref 65–99)
POTASSIUM: 4.4 mmol/L (ref 3.5–5.3)
Sodium: 139 mmol/L (ref 135–146)
Total Bilirubin: 0.3 mg/dL (ref 0.2–1.2)
Total Protein: 6.7 g/dL (ref 6.1–8.1)

## 2016-05-21 LAB — HEMOGLOBIN A1C
Hgb A1c MFr Bld: 6.2 % — ABNORMAL HIGH (ref <5.7)
Mean Plasma Glucose: 131 mg/dL

## 2016-05-21 LAB — LDL CHOLESTEROL, DIRECT: Direct LDL: 125 mg/dL (ref <130)

## 2016-05-21 MED ORDER — OLOPATADINE HCL 0.7 % OP SOLN: 1.0000 [drp] | Freq: Every day | OPHTHALMIC | Status: DC

## 2016-05-21 NOTE — Assessment & Plan Note (Signed)
Allergic conjunctivitis  Script sent for patanol

## 2016-05-21 NOTE — Assessment & Plan Note (Signed)

## 2016-06-08 DIAGNOSIS — H5210 Myopia, unspecified eye: Secondary | ICD-10-CM | POA: Diagnosis not present

## 2016-06-08 DIAGNOSIS — H5213 Myopia, bilateral: Secondary | ICD-10-CM | POA: Diagnosis not present

## 2016-07-15 ENCOUNTER — Other Ambulatory Visit: Payer: Self-pay | Admitting: Family Medicine

## 2016-09-16 ENCOUNTER — Ambulatory Visit (INDEPENDENT_AMBULATORY_CARE_PROVIDER_SITE_OTHER): Payer: Commercial Managed Care - HMO | Admitting: Family Medicine

## 2016-09-16 ENCOUNTER — Encounter: Payer: Self-pay | Admitting: Family Medicine

## 2016-09-16 VITALS — BP 128/82 | HR 76 | Resp 16 | Ht 74.0 in | Wt 275.0 lb

## 2016-09-16 DIAGNOSIS — I10 Essential (primary) hypertension: Secondary | ICD-10-CM

## 2016-09-16 DIAGNOSIS — R972 Elevated prostate specific antigen [PSA]: Secondary | ICD-10-CM

## 2016-09-16 DIAGNOSIS — R7303 Prediabetes: Secondary | ICD-10-CM | POA: Diagnosis not present

## 2016-09-16 DIAGNOSIS — M109 Gout, unspecified: Secondary | ICD-10-CM

## 2016-09-16 DIAGNOSIS — M25561 Pain in right knee: Secondary | ICD-10-CM

## 2016-09-16 DIAGNOSIS — E785 Hyperlipidemia, unspecified: Secondary | ICD-10-CM

## 2016-09-16 LAB — URIC ACID: Uric Acid, Serum: 8.3 mg/dL — ABNORMAL HIGH (ref 4.0–8.0)

## 2016-09-16 LAB — BASIC METABOLIC PANEL WITH GFR
BUN: 16 mg/dL (ref 7–25)
CHLORIDE: 107 mmol/L (ref 98–110)
CO2: 25 mmol/L (ref 20–31)
CREATININE: 1.25 mg/dL — AB (ref 0.70–1.18)
Calcium: 9.2 mg/dL (ref 8.6–10.3)
GFR, Est African American: 67 mL/min (ref >=60)
GFR, Est Non African American: 58 mL/min — ABNORMAL LOW (ref >=60)
Glucose, Bld: 106 mg/dL — ABNORMAL HIGH (ref 65–99)
POTASSIUM: 4.7 mmol/L (ref 3.5–5.3)
SODIUM: 142 mmol/L (ref 135–146)

## 2016-09-16 LAB — TSH: TSH: 0.76 mIU/L (ref 0.40–4.50)

## 2016-09-16 LAB — LIPID PANEL
CHOLESTEROL: 197 mg/dL (ref 125–200)
HDL: 52 mg/dL (ref >=40)
LDL CALC: 125 mg/dL (ref <130)
Total CHOL/HDL Ratio: 3.8 Ratio (ref <=5.0)
Triglycerides: 98 mg/dL (ref <150)
VLDL: 20 mg/dL (ref <30)

## 2016-09-16 MED ORDER — PREDNISONE 5 MG PO TABS: 5.0000 mg | ORAL_TABLET | Freq: Two times a day (BID) | ORAL | 0 refills | Status: AC

## 2016-09-16 NOTE — Progress Notes (Signed)
   Carlos Leon     MRN: [id number]      DOB: November 07, 1946   HPI Carlos Leon is here with c/o acute joint pains starting last week involving initially left knee and ankle, both improved, currently symptomatic in right knee, had gout in  The past, states has been overeating processed foods, and he believes that this is the trigger Wants to hold on prostate f/u States poor sleep at night , but no gainful work ion the day and napping off for 2 to 3 hrs in recliner, also no exercise commitment , will  Address lifestyle changes  ROS Denies recent fever or chills. Denies sinus pressure, nasal congestion, ear pain or sore throat. Denies chest congestion, productive cough or wheezing. Denies chest pains, palpitations and leg swelling Denies abdominal pain, nausea, vomiting,diarrhea or constipation.   Denies dysuria, frequency, hesitancy or incontinence.  Denies headaches, seizures, numbness, or tingling.  Denies skin break down or rash.   PE  BP 128/82   Pulse 76   Resp 16   Ht 6\' 2"  (1.88 m)   Wt 275 lb (124.7 kg)   SpO2 98%   BMI 35.31 kg/m   Patient alert and oriented and in no cardiopulmonary distress.  HEENT: No facial asymmetry, EOMI,   oropharynx pink and moist.  Neck supple no JVD, no mass.  Chest: Clear to auscultation bilaterally.  CVS: S1, S2 no murmurs, no S3.IrRegular rate.  ABD: Soft non tender.   Ext: No edema  MS: Adequate ROM spine, shoulders, hips and reduced in right knee which is swollen and slightly warm.  Skin: Intact, no ulcerations or rash noted.  Psych: Good eye contact, normal affect. Memory intact not anxious or depressed appearing.  CNS: CN 2-12 intact, power,  normal throughout.no focal deficits noted.   Assessment & Plan  Knee pain, right Right knee pain x 1 week, has had gout in the past, was in  Left ankle and left knee, no trauma, short steroid course and labs to determine uric acid level  GOUT, UNSPECIFIED 1 week h/o joint pain and  swelling affectging several joints, short course of prednisone and uric acid level to be checked  Essential hypertension, benign Controlled, no change in medication DASH diet and commitment to daily physical activity for a minimum of 30 minutes discussed and encouraged, as a part of hypertension management. The importance of attaining a healthy weight is also discussed.  BP/Weight 09/16/2016 05/20/2016 09/15/2015 01/30/2015 08/20/2014 03/18/2014 0000000  Systolic BP 0000000 123456 0000000 0000000 [id number] Q000111Q Q000111Q  Diastolic BP 82 80 82 82 68 80 91  Wt. (Lbs) 275 278 285 285 279.04 283.8 274  BMI 35.31 35.68 36.58 37.09 36.31 36.93 35.16       Elevated PSA Followed by urology in [place] , pt deciding at this time to hold off on recommended f/u  Morbid obesity Improved Patient re-educated about  the importance of commitment to a  minimum of 150 minutes of exercise per week.  The importance of healthy food choices with portion control discussed. Encouraged to start a food diary, count calories and to consider  joining a support group. Sample diet sheets offered. Goals set by the patient for the next several months.   Weight /BMI 09/16/2016 05/20/2016 09/15/2015  WEIGHT 275 lb 278 lb 285 lb  HEIGHT 6\' 2"  6\' 2"  6\' 2"   BMI 35.31 kg/m2 35.68 kg/m2 36.58 kg/m2

## 2016-09-16 NOTE — Assessment & Plan Note (Signed)
Controlled, no change in medication DASH diet and commitment to daily physical activity for a minimum of 30 minutes discussed and encouraged, as a part of hypertension management. The importance of attaining a healthy weight is also discussed.  BP/Weight 09/16/2016 05/20/2016 09/15/2015 01/30/2015 08/20/2014 03/18/2014 0000000  Systolic BP 0000000 123456 0000000 0000000 [id number] Q000111Q Q000111Q  Diastolic BP 82 80 82 82 68 80 91  Wt. (Lbs) 275 278 285 285 279.04 283.8 274  BMI 35.31 35.68 36.58 37.09 36.31 36.93 35.16

## 2016-09-16 NOTE — Assessment & Plan Note (Signed)
1 week h/o joint pain and swelling affectging several joints, short course of prednisone and uric acid level to be checked

## 2016-09-16 NOTE — Assessment & Plan Note (Addendum)
Right knee pain x 1 week, has had gout in the past, was in  Left ankle and left knee, no trauma, short steroid course and labs to determine uric acid level

## 2016-09-16 NOTE — Assessment & Plan Note (Signed)
Improved Patient re-educated about  the importance of commitment to a  minimum of 150 minutes of exercise per week.  The importance of healthy food choices with portion control discussed. Encouraged to start a food diary, count calories and to consider  joining a support group. Sample diet sheets offered. Goals set by the patient for the next several months.   Weight /BMI 09/16/2016 05/20/2016 09/15/2015  WEIGHT 275 lb 278 lb 285 lb  HEIGHT 6\' 2"  6\' 2"  6\' 2"   BMI 35.31 kg/m2 35.68 kg/m2 36.58 kg/m2

## 2016-09-16 NOTE — Assessment & Plan Note (Signed)
Followed by urology in [place] , pt deciding at this time to hold off on recommended f/u

## 2016-09-16 NOTE — Patient Instructions (Signed)
Wellness visit as before, in [place], call if you need me sooner  Labs today  5 day course of prednisone sent for right knee pain and swelling  Please sTOP processed food and get back to natural foods, vegetables and fruit , fresh or frozen  It is important that you exercise regularly at least 30 minutes 5 times a week. If you develop chest pain, have severe difficulty breathing, or feel very tired, stop exercising immediately and seek medical attention    Feed the babies and get busy, this will improve sleep and add meaning to life!  Thank you  for choosing [facility]. We consider it a privelige to serve you.  Delivering excellent health care in a caring and  compassionate way is our goal.  Partnering with you,  so that together we can achieve this goal is our strategy.

## 2016-09-17 ENCOUNTER — Other Ambulatory Visit: Payer: Self-pay | Admitting: Family Medicine

## 2016-09-17 ENCOUNTER — Other Ambulatory Visit: Payer: Self-pay

## 2016-09-17 LAB — HEMOGLOBIN A1C
HEMOGLOBIN A1C: 6 % — AB (ref <5.7)
MEAN PLASMA GLUCOSE: 126 mg/dL

## 2016-09-17 MED ORDER — ALLOPURINOL 300 MG PO TABS: 300.0000 mg | ORAL_TABLET | Freq: Every day | ORAL | 6 refills | Status: DC

## 2016-09-22 ENCOUNTER — Telehealth: Payer: Self-pay | Admitting: Family Medicine

## 2016-09-22 NOTE — Telephone Encounter (Signed)
Patient is calling asking for lab results and is asking if Dr. Moshe Cipro was going to change his medication, please advise?

## 2016-09-22 NOTE — Telephone Encounter (Signed)
Patient aware of results. And medication at pharmacy.

## 2016-10-25 ENCOUNTER — Other Ambulatory Visit: Payer: Self-pay

## 2016-10-25 MED ORDER — BENAZEPRIL HCL 20 MG PO TABS: ORAL_TABLET | ORAL | 1 refills | Status: DC

## 2016-10-25 MED ORDER — AMLODIPINE BESYLATE 10 MG PO TABS: 10.0000 mg | ORAL_TABLET | Freq: Every day | ORAL | 1 refills | Status: DC

## 2016-10-25 MED ORDER — AMLODIPINE BESYLATE 10 MG PO TABS: 10.0000 mg | ORAL_TABLET | Freq: Every day | ORAL | 0 refills | Status: DC

## 2016-11-08 ENCOUNTER — Encounter: Payer: Self-pay | Admitting: Internal Medicine

## 2016-11-10 ENCOUNTER — Ambulatory Visit: Payer: Commercial Managed Care - HMO

## 2016-11-24 ENCOUNTER — Ambulatory Visit (INDEPENDENT_AMBULATORY_CARE_PROVIDER_SITE_OTHER): Payer: Commercial Managed Care - HMO

## 2016-11-24 VITALS — BP 140/82 | HR 64 | Resp 18 | Ht 74.0 in | Wt 282.0 lb

## 2016-11-24 DIAGNOSIS — H9192 Unspecified hearing loss, left ear: Secondary | ICD-10-CM | POA: Diagnosis not present

## 2016-11-24 DIAGNOSIS — Z0000 Encounter for general adult medical examination without abnormal findings: Secondary | ICD-10-CM

## 2016-11-24 NOTE — Progress Notes (Signed)
Patient c/o of hearing problems in left ear.  Sounds are muffled.  Referred to ENT in [place]

## 2016-11-24 NOTE — Patient Instructions (Signed)
Thank you for choosing [facility] for your health care needs  The Annual Wellness Visit is designed to allow Carlos Leon the chance to assist you in preserving and improving you health.   Dr. Moshe Cipro will see you back in 4 months  If any labs are needed they will be mailed to you with the approximate date to have them drawn  I will be in touch with you in regards to the referral the ENT in [place] for hearing problems with your left ear.   If you have any questions or concerns feel free to contact the office.

## 2016-11-24 NOTE — Progress Notes (Addendum)
Subjective:    Carlos Leon is a 70 y.o. male who presents for Medicare Annual/Subsequent preventive examination.   Preventive Screening-Counseling & Management  Tobacco History  Smoking Status  . Former Smoker  . Packs/day: 2.00  . Years: 5.00  . Types: Cigarettes  Smokeless Tobacco  . Never Used    Current Problems (verified) Patient Active Problem List   Diagnosis Date Noted  . Knee pain, right 09/16/2016  . Allergic rhinitis 01/30/2015  . Metabolic syndrome X [id number]  . Asymptomatic proteinuria 08/08/2013  . Elevated PSA 11/19/2012  . Atrial fibrillation ([place]) 02/07/2012  . History of colon polyps 10/27/2011  . 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 Hyperlipemia 08/26/2009  . Morbid obesity ([place]) 04/18/2009  . Prediabetes 10/21/2008  . GOUT, UNSPECIFIED 10/21/2008  . Essential hypertension, benign 10/21/2008    Medications Prior to Visit Current Outpatient Prescriptions on File Prior to Visit  Medication Sig Dispense Refill  . allopurinol (ZYLOPRIM) 300 MG tablet Take 1 tablet (300 mg total) by mouth daily. 30 tablet 6  . amLODipine (NORVASC) 10 MG tablet Take 1 tablet (10 mg total) by mouth daily. 7 tablet 0  . aspirin EC 81 MG tablet Take 1 tablet (81 mg total) by mouth daily. 31 tablet 11  . benazepril (LOTENSIN) 20 MG tablet TAKE 2 TABLETS (40 MG TOTAL) BY MOUTH DAILY. 180 tablet 1   No current facility-administered medications on file prior to visit.     Current Medications (verified) Current Outpatient Prescriptions  Medication Sig Dispense Refill  . allopurinol (ZYLOPRIM) 300 MG tablet Take 1 tablet (300 mg total) by mouth daily. 30 tablet 6  . amLODipine (NORVASC) 10 MG tablet Take 1 tablet (10 mg total) by mouth daily. 7 tablet 0  . aspirin EC 81 MG tablet Take 1 tablet (81 mg total) by mouth daily. 31 tablet 11  . benazepril (LOTENSIN) 20 MG tablet TAKE 2 TABLETS (40 MG TOTAL) BY MOUTH DAILY. 180 tablet 1   No current  facility-administered medications for this visit.      Allergies (verified) Patient has no known allergies.   PAST HISTORY  Family History Family History  Problem Relation Age of Onset  . Hypertension Mother   . Cancer Mother     Rectal, approximately 67  . Diabetes Father   . Liver disease Neg Hx   . Colon cancer Neg Hx     Social History Social History  Substance Use Topics  . Smoking status: Former Smoker    Packs/day: 2.00    Years: 5.00    Types: Cigarettes  . Smokeless tobacco: Never Used  . Alcohol use No    Are there smokers in your home (other than you)?  No  Risk Factors Current exercise habits: The patient does not participate in regular exercise at present.  But is active around his home with yard work.  Dietary issues discussed: Changes with food labels and decreasing the amount of fast food   Cardiac risk factors: advanced age (older than 74 for men, 55 for women), hypertension and male gender.  Depression Screen (Note: if answer to either of the following is "Yes", a more complete depression screening is indicated)   Q1: Over the past two weeks, have you felt down, depressed or hopeless? No  Q2: Over the past two weeks, have you felt little interest or pleasure in doing things? No  Have you lost interest or pleasure in daily life? No  Do you often feel hopeless? No  Do  you cry easily over simple problems? No  Activities of Daily Living In your present state of health, do you have any difficulty performing the following activities?:  Driving? No Managing money?  No Feeding yourself? No Getting from bed to chair? No Climbing a flight of stairs? No Preparing food and eating?: No Bathing or showering? No Getting dressed: No Getting to the toilet? No Using the toilet:No Moving around from place to place: No In the past year have you fallen or had a near fall?:Yes   Are you sexually active?  Yes  Do you have more than one partner?  No  Hearing  Difficulties: Yes Do you often ask people to speak up or repeat themselves? Yes Do you experience ringing or noises in your ears? No Do you have difficulty understanding soft or whispered voices? Yes   Do you feel that you have a problem with memory? No  Do you often misplace items? No  Do you feel safe at home?  Yes  Cognitive Testing  Alert? Yes  Normal Appearance?Yes  Oriented to person? Yes  Place? Yes   Time? Yes  Recall of three objects?  Yes  Can perform simple calculations? Yes  Displays appropriate judgment?Yes  Can read the correct time from a watch face?Yes   Advanced Directives have been discussed with the patient? Yes   List the Names of Other Physician/Practitioners you currently use: 1.  Dr. Gershon Crane (opthamology) 2.  [place] 3.  Dr. Exie Parody (urology)   Indicate any recent Medical Services you may have received from other than [facility] providers in the past year (date may be approximate).  Immunization History  Administered Date(s) Administered  . Pneumococcal Conjugate-13 08/20/2014  . Pneumococcal Polysaccharide-23 02/07/2012  . Tdap 02/07/2012    Screening Tests Health Maintenance  Topic Date Due  . INFLUENZA VACCINE  03/26/2017 (Originally 07/27/2016)  . ZOSTAVAX  06/15/2018 (Originally 09/16/2006)  . HEMOGLOBIN A1C  03/16/2017  . COLONOSCOPY  11/23/2021  . TETANUS/TDAP  02/06/2022  . Hepatitis C Screening  Completed  . PNA vac Low Risk Adult  Completed    All answers were reviewed with the patient and necessary referrals were made:  Vanetta Mulders, LPN   [id number]   History reviewed: allergies, current medications, past family history, past medical history, past social history, past surgical history and problem list  Review of Systems A comprehensive review of systems was negative.    Objective:     Vision by Snellen chart: right eye:20/25, left eye:20/25 Blood pressure 140/82, pulse 64, resp. rate 18, height 6\' 2"  (1.88 m),  weight 282 lb (127.9 kg), SpO2 98 %. Body mass index is 36.21 kg/m.  No exam performed today, annual wellness without physical exam.     Assessment:  Medicare annual wellness visit, subsequent Annual exam as documented. Counseling done  re healthy lifestyle involving commitment to 150 minutes exercise per week, heart healthy diet, and attaining healthy weight.The importance of adequate sleep also discussed. Regular seat belt use and home safety, is also discussed. Changes in health habits are decided on by the patient with goals and time frames  set for achieving them. Immunization and cancer screening needs are specifically addressed at this visit.       Plan:     During the course of the visit the patient was educated and counseled about appropriate screening and preventive services including:    Referral for hearing eval  Diet review for nutrition referral? Yes ____  Not Indicated  _x___   Patient Instructions (the written plan) was given to the patient.  Medicare Attestation I have personally reviewed: The patient's medical and social history Their use of alcohol, tobacco or illicit drugs Their current medications and supplements The patient's functional ability including ADLs,fall risks, home safety risks, cognitive, and hearing and visual impairment Diet and physical activities Evidence for depression or mood disorders  The patient's weight, height, BMI, and visual acuity have been recorded in the chart.  I have made referrals, counseling, and provided education to the patient based on review of the above and I have provided the patient with a written personalized care plan for preventive services.     Denman George [place], [place]   [id number]

## 2016-11-28 NOTE — Assessment & Plan Note (Signed)

## 2016-12-13 ENCOUNTER — Ambulatory Visit (INDEPENDENT_AMBULATORY_CARE_PROVIDER_SITE_OTHER): Payer: Commercial Managed Care - HMO | Admitting: Otolaryngology

## 2016-12-13 DIAGNOSIS — H903 Sensorineural hearing loss, bilateral: Secondary | ICD-10-CM

## 2016-12-21 ENCOUNTER — Other Ambulatory Visit (INDEPENDENT_AMBULATORY_CARE_PROVIDER_SITE_OTHER): Payer: Self-pay | Admitting: Otolaryngology

## 2016-12-21 DIAGNOSIS — H918X3 Other specified hearing loss, bilateral: Secondary | ICD-10-CM

## 2016-12-21 DIAGNOSIS — H918X9 Other specified hearing loss, unspecified ear: Secondary | ICD-10-CM

## 2016-12-24 ENCOUNTER — Other Ambulatory Visit (INDEPENDENT_AMBULATORY_CARE_PROVIDER_SITE_OTHER): Payer: Self-pay | Admitting: Otolaryngology

## 2016-12-24 ENCOUNTER — Ambulatory Visit (HOSPITAL_COMMUNITY)
Admission: RE | Admit: 2016-12-24 | Discharge: 2016-12-24 | Disposition: A | Discharge status: Home / Self Care | Payer: Commercial Managed Care - HMO | Source: Ambulatory Visit | Attending: Otolaryngology | Admitting: Otolaryngology

## 2016-12-24 DIAGNOSIS — H918X9 Other specified hearing loss, unspecified ear: Secondary | ICD-10-CM

## 2016-12-24 DIAGNOSIS — H9193 Unspecified hearing loss, bilateral: Secondary | ICD-10-CM | POA: Diagnosis not present

## 2016-12-24 LAB — POCT I-STAT CREATININE: Creatinine, Ser: 1.5 mg/dL — ABNORMAL HIGH (ref 0.61–1.24)

## 2016-12-24 MED ORDER — GADOBENATE DIMEGLUMINE 529 MG/ML IV SOLN: 20.0000 mL | Freq: Once | INTRAVENOUS | Status: DC | PRN

## 2016-12-30 ENCOUNTER — Ambulatory Visit: Payer: Commercial Managed Care - HMO | Admitting: Family Medicine

## 2017-01-03 ENCOUNTER — Ambulatory Visit (INDEPENDENT_AMBULATORY_CARE_PROVIDER_SITE_OTHER): Payer: Commercial Managed Care - HMO | Admitting: Otolaryngology

## 2017-02-24 ENCOUNTER — Ambulatory Visit: Payer: Commercial Managed Care - HMO | Admitting: Family Medicine

## 2017-03-23 ENCOUNTER — Ambulatory Visit: Payer: Commercial Managed Care - HMO | Admitting: Family Medicine

## 2017-03-28 ENCOUNTER — Other Ambulatory Visit: Payer: Self-pay | Admitting: Family Medicine

## 2017-04-21 ENCOUNTER — Ambulatory Visit (INDEPENDENT_AMBULATORY_CARE_PROVIDER_SITE_OTHER): Payer: Medicare HMO | Admitting: Family Medicine

## 2017-04-21 ENCOUNTER — Encounter: Payer: Self-pay | Admitting: Family Medicine

## 2017-04-21 VITALS — BP 112/78 | HR 84 | Resp 16 | Ht 74.0 in | Wt 286.0 lb

## 2017-04-21 DIAGNOSIS — R7303 Prediabetes: Secondary | ICD-10-CM | POA: Diagnosis not present

## 2017-04-21 DIAGNOSIS — I10 Essential (primary) hypertension: Secondary | ICD-10-CM

## 2017-04-21 DIAGNOSIS — I4891 Unspecified atrial fibrillation: Secondary | ICD-10-CM

## 2017-04-21 DIAGNOSIS — E785 Hyperlipidemia, unspecified: Secondary | ICD-10-CM | POA: Diagnosis not present

## 2017-04-21 DIAGNOSIS — E8881 Metabolic syndrome: Secondary | ICD-10-CM

## 2017-04-21 DIAGNOSIS — M109 Gout, unspecified: Secondary | ICD-10-CM

## 2017-04-21 NOTE — Patient Instructions (Signed)
Wellness with Albina Billet and Annual physical exam with MD first week in October, call if you need me before  It is important that you exercise regularly at least 30 minutes 5 times a week. If you develop chest pain, have severe difficulty breathing, or feel very tired, stop exercising immediately and seek medical attention    Change eating habits, weight loss goal of 10 pounds in 5 months    Fasting lipid, cmp and eGFR and HBA1C and uric acid within next 1 week  Low back pain is likely arthritis, OK to take ES tylenol, losing weight and keeping active is also very helpful  Thanks for choosing [facility], we consider it a privelige to serve you.

## 2017-04-23 NOTE — Assessment & Plan Note (Signed)
Irregular though controlled rate , refuses anti coagullation to reduce stroke risk

## 2017-04-23 NOTE — Assessment & Plan Note (Signed)
Hyperlipidemia:Low fat diet discussed and encouraged.   Lipid Panel  Lab Results  Component Value Date   CHOL 197 09/16/2016   HDL 52 09/16/2016   LDLCALC 125 09/16/2016   LDLDIRECT 125 05/20/2016   TRIG 98 09/16/2016   CHOLHDL 3.8 09/16/2016   Updated lab needed

## 2017-04-23 NOTE — Progress Notes (Signed)
Carlos Leon     MRN: [id number]      DOB: 05-28-1946   HPI Carlos Leon is here for follow up and re-evaluation of chronic medical conditions, medication management and review of any available recent lab and radiology data.  Preventive health is updated, specifically  Cancer screening and Immunization.   Questions or concerns regarding consultations or procedures which the PT has had in the interim are  addressed. The PT denies any adverse reactions to current medications since the last visit.  1 year h/o right hip pain aggravated by certain movements and direct pressure when rolling over in bed and playing golf Feels fatigued, not as active as he should be with excessive weight gain, plans to work on reversing this  ROS Denies recent fever or chills. Denies sinus pressure, nasal congestion, ear pain or sore throat. Denies chest congestion, productive cough or wheezing. Denies chest pains, palpitations and leg swelling Denies abdominal pain, nausea, vomiting,diarrhea or constipation.   Denies dysuria, frequency, hesitancy or incontinence. . Denies headaches, seizures, numbness, or tingling. Denies depression, anxiety or insomnia. Denies skin break down or rash.   PE  BP 112/78   Pulse 84   Resp 16   Ht 6\' 2"  (1.88 m)   Wt 286 lb (129.7 kg)   SpO2 98%   BMI 36.72 kg/m   Patient alert and oriented and in no cardiopulmonary distress.  HEENT: No facial asymmetry, EOMI,   oropharynx pink and moist.  Neck supple no JVD, no mass.  Chest: Clear to auscultation bilaterally.  CVS: S1, S2 no murmurs, no S3.IrRegular rate.  ABD: Soft non tender.   Ext: No edema  MS: Adequate ROM spine, shoulders, hips and knees.Tender over right SI joint  Skin: Intact, no ulcerations or rash noted.  Psych: Good eye contact, normal affect. Memory intact not anxious or depressed appearing.  CNS: CN 2-12 intact, power,  normal throughout.no focal deficits noted.   Assessment &  Plan  Essential hypertension, benign Controlled, no change in medication DASH diet and commitment to daily physical activity for a minimum of 30 minutes discussed and encouraged, as a part of hypertension management. The importance of attaining a healthy weight is also discussed.  BP/Weight 04/21/2017 11/24/2016 09/16/2016 05/20/2016 09/15/2015 01/30/2015 7/67/3419  Systolic BP 379 024 097 353 299 242 683  Diastolic BP 78 82 82 80 82 82 68  Wt. (Lbs) 286 282 275 278 285 285 279.04  BMI 36.72 36.21 35.31 35.68 36.58 37.09 36.31       Atrial fibrillation Irregular though controlled rate , refuses anti coagullation to reduce stroke risk  Morbid obesity Deteriorated. Patient re-educated about  the importance of commitment to a  minimum of 150 minutes of exercise per week.  The importance of healthy food choices with portion control discussed. Encouraged to start a food diary, count calories and to consider  joining a support group. Sample diet sheets offered. Goals set by the patient for the next several months.   Weight /BMI 04/21/2017 11/24/2016 09/16/2016  WEIGHT 286 lb 282 lb 275 lb  HEIGHT 6\' 2"  6\' 2"  6\' 2"   BMI 36.72 kg/m2 36.21 kg/m2 35.31 kg/m2      Prediabetes Patient educated about the importance of limiting  Carbohydrate intake , the need to commit to daily physical activity for a minimum of 30 minutes , and to commit weight loss. The fact that changes in all these areas will reduce or eliminate all together the development of diabetes is stressed.  Diabetic Labs Latest Ref Rng & Units 12/24/2016 09/16/2016 05/20/2016 02/03/2015 09/03/2014  HbA1c <5.7 % - 6.0(H) 6.2(H) 6.2(H) 6.3(H)  Microalbumin 0.00 - 1.89 mg/dL - - - - -  Micro/Creat Ratio 0.0 - 30.0 mg/g - - - - -  Chol 125 - 200 mg/dL - 197 - 208(H) 185  HDL >=40 mg/dL - 52 - 46 46  Calc LDL <130 mg/dL - 125 - 140(H) 117(H)  Triglycerides <150 mg/dL - 98 - 112 110  Creatinine 0.61 - 1.24 mg/dL 1.50(H) 1.25(H) 1.35(H)  1.26 1.41(H)   BP/Weight 04/21/2017 11/24/2016 09/16/2016 05/20/2016 09/15/2015 01/30/2015 2/70/3500  Systolic BP 938 182 993 716 967 893 810  Diastolic BP 78 82 82 80 82 82 68  Wt. (Lbs) 286 282 275 278 285 285 279.04  BMI 36.72 36.21 35.31 35.68 36.58 37.09 36.31   Foot/eye exam completion dates 05/20/2016  Foot Form Completion Done  Updated lab needed     Hyperlipemia Hyperlipidemia:Low fat diet discussed and encouraged.   Lipid Panel  Lab Results  Component Value Date   CHOL 197 09/16/2016   HDL 52 09/16/2016   LDLCALC 125 09/16/2016   LDLDIRECT 125 05/20/2016   TRIG 98 09/16/2016   CHOLHDL 3.8 09/16/2016   Updated lab needed    Metabolic syndrome X The increased risk of cardiovascular disease associated with this diagnosis, and the need to consistently work on lifestyle to change this is discussed. Following  a  heart healthy diet ,commitment to 30 minutes of exercise at least 5 days per week, as well as control of blood sugar and cholesterol , and achieving a healthy weight are all the areas to be addressed .

## 2017-04-23 NOTE — Assessment & Plan Note (Signed)
Controlled, no change in medication DASH diet and commitment to daily physical activity for a minimum of 30 minutes discussed and encouraged, as a part of hypertension management. The importance of attaining a healthy weight is also discussed.  BP/Weight 04/21/2017 11/24/2016 09/16/2016 05/20/2016 09/15/2015 01/30/2015 6/75/9163  Systolic BP 846 659 935 701 779 390 300  Diastolic BP 78 82 82 80 82 82 68  Wt. (Lbs) 286 282 275 278 285 285 279.04  BMI 36.72 36.21 35.31 35.68 36.58 37.09 36.31

## 2017-04-23 NOTE — Assessment & Plan Note (Signed)
Deteriorated. Patient re-educated about  the importance of commitment to a  minimum of 150 minutes of exercise per week.  The importance of healthy food choices with portion control discussed. Encouraged to start a food diary, count calories and to consider  joining a support group. Sample diet sheets offered. Goals set by the patient for the next several months.   Weight /BMI 04/21/2017 11/24/2016 09/16/2016  WEIGHT 286 lb 282 lb 275 lb  HEIGHT 6\' 2"  6\' 2"  6\' 2"   BMI 36.72 kg/m2 36.21 kg/m2 35.31 kg/m2

## 2017-04-23 NOTE — Assessment & Plan Note (Signed)
Patient educated about the importance of limiting  Carbohydrate intake , the need to commit to daily physical activity for a minimum of 30 minutes , and to commit weight loss. The fact that changes in all these areas will reduce or eliminate all together the development of diabetes is stressed.   Diabetic Labs Latest Ref Rng & Units 12/24/2016 09/16/2016 05/20/2016 02/03/2015 09/03/2014  HbA1c <5.7 % - 6.0(H) 6.2(H) 6.2(H) 6.3(H)  Microalbumin 0.00 - 1.89 mg/dL - - - - -  Micro/Creat Ratio 0.0 - 30.0 mg/g - - - - -  Chol 125 - 200 mg/dL - 197 - 208(H) 185  HDL >=40 mg/dL - 52 - 46 46  Calc LDL <130 mg/dL - 125 - 140(H) 117(H)  Triglycerides <150 mg/dL - 98 - 112 110  Creatinine 0.61 - 1.24 mg/dL 1.50(H) 1.25(H) 1.35(H) 1.26 1.41(H)   BP/Weight 04/21/2017 11/24/2016 09/16/2016 05/20/2016 09/15/2015 01/30/2015 5/61/5379  Systolic BP 432 761 470 929 574 734 037  Diastolic BP 78 82 82 80 82 82 68  Wt. (Lbs) 286 282 275 278 285 285 279.04  BMI 36.72 36.21 35.31 35.68 36.58 37.09 36.31   Foot/eye exam completion dates 05/20/2016  Foot Form Completion Done  Updated lab needed

## 2017-04-23 NOTE — Assessment & Plan Note (Signed)
The increased risk of cardiovascular disease associated with this diagnosis, and the need to consistently work on lifestyle to change this is discussed. Following  a  heart healthy diet ,commitment to 30 minutes of exercise at least 5 days per week, as well as control of blood sugar and cholesterol , and achieving a healthy weight are all the areas to be addressed .  

## 2017-04-29 ENCOUNTER — Other Ambulatory Visit: Payer: Self-pay | Admitting: Family Medicine

## 2017-04-29 DIAGNOSIS — I10 Essential (primary) hypertension: Secondary | ICD-10-CM | POA: Diagnosis not present

## 2017-04-29 DIAGNOSIS — E785 Hyperlipidemia, unspecified: Secondary | ICD-10-CM | POA: Diagnosis not present

## 2017-04-29 DIAGNOSIS — R7303 Prediabetes: Secondary | ICD-10-CM | POA: Diagnosis not present

## 2017-04-29 DIAGNOSIS — M109 Gout, unspecified: Secondary | ICD-10-CM | POA: Diagnosis not present

## 2017-04-29 LAB — LIPID PANEL
Cholesterol: 185 mg/dL (ref <200)
HDL: 48 mg/dL (ref >40)
LDL CALC: 116 mg/dL — AB (ref <100)
Total CHOL/HDL Ratio: 3.9 Ratio (ref <5.0)
Triglycerides: 103 mg/dL (ref <150)
VLDL: 21 mg/dL (ref <30)

## 2017-04-29 LAB — CMP 10231
AG RATIO: 1.5 ratio (ref 1.0–2.5)
ALK PHOS: 89 U/L (ref 40–115)
ALT: 22 U/L (ref 9–46)
AST: 22 U/L (ref 10–35)
Albumin: 4 g/dL (ref 3.6–5.1)
BILIRUBIN TOTAL: 0.6 mg/dL (ref 0.2–1.2)
BUN/Creatinine Ratio: 12.2 Ratio (ref 6–22)
BUN: 14 mg/dL (ref 7–25)
CALCIUM: 8.9 mg/dL (ref 8.6–10.3)
CO2: 27 mmol/L (ref 20–31)
CREATININE: 1.15 mg/dL (ref 0.70–1.18)
Chloride: 107 mmol/L (ref 98–110)
GFR, EST NON AFRICAN AMERICAN: 64 mL/min (ref >=60)
GFR, Est African American: 74 mL/min (ref >=60)
GLOBULIN: 2.6 g/dL (ref 1.9–3.7)
GLUCOSE: 128 mg/dL — AB (ref 65–99)
Potassium: 4.5 mmol/L (ref 3.5–5.3)
Sodium: 140 mmol/L (ref 135–146)
Total Protein: 6.6 g/dL (ref 6.1–8.1)

## 2017-04-29 LAB — URIC ACID: URIC ACID, SERUM: 8.2 mg/dL — AB (ref 4.0–8.0)

## 2017-04-30 LAB — HEMOGLOBIN A1C
HEMOGLOBIN A1C: 6 % — AB (ref <5.7)
MEAN PLASMA GLUCOSE: 126 mg/dL

## 2017-05-01 ENCOUNTER — Encounter: Payer: Self-pay | Admitting: Family Medicine

## 2017-05-05 ENCOUNTER — Other Ambulatory Visit: Payer: Self-pay

## 2017-05-05 MED ORDER — ALLOPURINOL 300 MG PO TABS: 300.0000 mg | ORAL_TABLET | Freq: Every day | ORAL | 6 refills | Status: DC

## 2017-05-08 ENCOUNTER — Encounter: Payer: Self-pay | Admitting: Family Medicine

## 2017-06-13 ENCOUNTER — Other Ambulatory Visit: Payer: Self-pay | Admitting: Family Medicine

## 2017-08-17 ENCOUNTER — Telehealth: Payer: Self-pay

## 2017-08-17 NOTE — Telephone Encounter (Signed)
Called pt to reschedule Medicare Annual Wellness Visit. -nr

## 2017-09-29 ENCOUNTER — Encounter: Payer: Self-pay | Admitting: Family Medicine

## 2017-09-29 ENCOUNTER — Ambulatory Visit: Payer: Medicare HMO

## 2017-09-29 ENCOUNTER — Ambulatory Visit (INDEPENDENT_AMBULATORY_CARE_PROVIDER_SITE_OTHER): Payer: Medicare HMO | Admitting: Family Medicine

## 2017-09-29 VITALS — BP 126/86 | HR 91 | Temp 98.0°F | Resp 16 | Ht 74.0 in | Wt 285.2 lb

## 2017-09-29 DIAGNOSIS — R972 Elevated prostate specific antigen [PSA]: Secondary | ICD-10-CM | POA: Diagnosis not present

## 2017-09-29 DIAGNOSIS — Z0000 Encounter for general adult medical examination without abnormal findings: Secondary | ICD-10-CM | POA: Diagnosis not present

## 2017-09-29 DIAGNOSIS — Z1211 Encounter for screening for malignant neoplasm of colon: Secondary | ICD-10-CM | POA: Diagnosis not present

## 2017-09-29 DIAGNOSIS — E785 Hyperlipidemia, unspecified: Secondary | ICD-10-CM | POA: Diagnosis not present

## 2017-09-29 LAB — HEMOCCULT GUIAC POC 1CARD (OFFICE): FECAL OCCULT BLD: NEGATIVE

## 2017-09-29 NOTE — Patient Instructions (Signed)
Annual wellness visit with nurse in next month or December  MD follow up in 6 months  Take allopurinol and blood pressure medication EVERY day  Reconsider flu vaccine  Fasting chem 7 as soon as possible  It is important that you exercise regularly at least 30 minutes 5 times a week. If you develop chest pain, have severe difficulty breathing, or feel very tired, stop exercising immediately and seek medical attention   Please work on good  health habits so that your health will improve. 1. Commitment to daily physical activity for 30 to 60  minutes, if you are able to do this.  2. Commitment to wise food choices. Aim for half of your  food intake to be vegetable and fruit, one quarter starchy foods, and one quarter protein. Try to eat on a regular schedule  3 meals per day, snacking between meals should be limited to vegetables or fruits or small portions of nuts. 64 ounces of water per day is generally recommended, unless you have specific health conditions, like heart failure or kidney failure where you will need to limit fluid intake.  3. Commitment to sufficient and a  good quality of physical and mental rest daily, generally between 6 to 8 hours per day.  WITH PERSISTANCE AND PERSEVERANCE, THE IMPOSSIBLE , BECOMES THE NORM!   Thank you  for choosing  Primary Care. We consider it a privelige to serve you.  Delivering excellent health care in a caring and  compassionate way is our goal.  Partnering with you,  so that together we can achieve this goal is our strategy.

## 2017-09-30 NOTE — Progress Notes (Signed)
   Carlos Leon     MRN: [id number]      DOB: 11/15/1946   HPI: Patient is in for annual physical exam. No other health concerns are expressed or addressed at the visit. Recent labs, if available are reviewed. Immunization is reviewed , refuses flu vaccine Has decided to get PSA checked this year, has been elevated in the past and he has consistently refused biopsy    PE; BP 126/86 (BP Location: Left Arm, Patient Position: Sitting, Cuff Size: Normal)   Pulse 91   Temp 98 F (36.7 C) (Other (Comment))   Resp 16   Ht 6\' 2"  (1.88 m)   Wt 285 lb 4 oz (129.4 kg)   SpO2 98%   BMI 36.62 kg/m   Pleasant male, alert and oriented x 3, in no cardio-pulmonary distress. Afebrile. HEENT No facial trauma or asymetry. Sinuses non tender. EOMI, pupils equally reactive to light. External ears normal, tympanic membranes clear. Oropharynx moist, no exudate. Neck: supple, no adenopathy,JVD or thyromegaly.No bruits.  Chest: Clear to ascultation bilaterally.No crackles or wheezes. Non tender to palpation  Breast: No asymetry,no masses. No nipple discharge or inversion. No axillary or supraclavicular adenopathy  Cardiovascular system; Heart sounds normal,  S1 and  S2 ,no S3.  No murmur, or thrill. Apical beat not displaced Peripheral pulses normal.  Abdomen: Soft, non tender, no organomegaly or masses. No bruits. Bowel sounds normal. No guarding, tenderness or rebound.  Rectal:  Normal sphincter tone. No hemorrhoids or  masses. guaiac negative stool. Prostate smooth and firm    Musculoskeletal exam: Full ROM of spine, hips , shoulders and knees. No deformity ,swelling or crepitus noted. No muscle wasting or atrophy.   Neurologic: Cranial nerves 2 to 12 intact. Power, tone ,sensation and reflexes normal throughout. No disturbance in gait. No tremor.  Skin: Intact, no ulceration, erythema , scaling or rash noted. Pigmentation normal throughout  Psych; Normal mood and  affect. Judgement and concentration normal   Assessment & Plan:  Annual physical exam Annual exam as documented. Counseling done  re healthy lifestyle involving commitment to 150 minutes exercise per week, heart healthy diet, and attaining healthy weight.The importance of adequate sleep also discussed. . Changes in health habits are decided on by the patient with goals and time frames  set for achieving them. Immunization and cancer screening needs are specifically addressed at this visit.

## 2017-10-01 DIAGNOSIS — R972 Elevated prostate specific antigen [PSA]: Secondary | ICD-10-CM | POA: Diagnosis not present

## 2017-10-01 DIAGNOSIS — E785 Hyperlipidemia, unspecified: Secondary | ICD-10-CM | POA: Diagnosis not present

## 2017-10-01 NOTE — Assessment & Plan Note (Signed)
Annual exam as documented. Counseling done  re healthy lifestyle involving commitment to 150 minutes exercise per week, heart healthy diet, and attaining healthy weight.The importance of adequate sleep also discussed. Changes in health habits are decided on by the patient with goals and time frames  set for achieving them. Immunization and cancer screening needs are specifically addressed at this visit. 

## 2017-10-03 ENCOUNTER — Other Ambulatory Visit: Payer: Self-pay | Admitting: Family Medicine

## 2017-10-03 LAB — PSA: PSA: 10.6 ng/mL — ABNORMAL HIGH (ref <=4.0)

## 2017-10-03 LAB — COMPLETE METABOLIC PANEL WITH GFR
AG Ratio: 1.6 (calc) (ref 1.0–2.5)
ALKALINE PHOSPHATASE (APISO): 104 U/L (ref 40–115)
ALT: 21 U/L (ref 9–46)
AST: 18 U/L (ref 10–35)
Albumin: 4.4 g/dL (ref 3.6–5.1)
BUN: 14 mg/dL (ref 7–25)
CALCIUM: 9.7 mg/dL (ref 8.6–10.3)
CHLORIDE: 105 mmol/L (ref 98–110)
CO2: 28 mmol/L (ref 20–32)
Creat: 1.18 mg/dL (ref 0.70–1.18)
GFR, EST AFRICAN AMERICAN: 72 mL/min/{1.73_m2} (ref >=60)
GFR, Est Non African American: 62 mL/min/{1.73_m2} (ref >=60)
GLOBULIN: 2.7 g/dL (ref 1.9–3.7)
Glucose, Bld: 121 mg/dL — ABNORMAL HIGH (ref 65–99)
POTASSIUM: 4.8 mmol/L (ref 3.5–5.3)
Sodium: 140 mmol/L (ref 135–146)
Total Bilirubin: 0.6 mg/dL (ref 0.2–1.2)
Total Protein: 7.1 g/dL (ref 6.1–8.1)

## 2017-10-03 MED ORDER — BENAZEPRIL HCL 20 MG PO TABS: ORAL_TABLET | ORAL | 3 refills | Status: DC

## 2017-10-22 ENCOUNTER — Other Ambulatory Visit: Payer: Self-pay | Admitting: Family Medicine

## 2017-11-22 ENCOUNTER — Other Ambulatory Visit: Payer: Self-pay

## 2017-11-22 ENCOUNTER — Encounter (HOSPITAL_COMMUNITY): Payer: Self-pay | Admitting: *Deleted

## 2017-11-22 ENCOUNTER — Emergency Department (HOSPITAL_COMMUNITY)
Admission: EM | Admit: 2017-11-22 | Discharge: 2017-11-22 | Disposition: A | Discharge status: Home / Self Care | Payer: Medicare HMO | Attending: Emergency Medicine | Admitting: Emergency Medicine

## 2017-11-22 ENCOUNTER — Emergency Department (HOSPITAL_COMMUNITY): Payer: Medicare HMO

## 2017-11-22 DIAGNOSIS — Y929 Unspecified place or not applicable: Secondary | ICD-10-CM | POA: Diagnosis not present

## 2017-11-22 DIAGNOSIS — S4991XA Unspecified injury of right shoulder and upper arm, initial encounter: Secondary | ICD-10-CM | POA: Diagnosis not present

## 2017-11-22 DIAGNOSIS — W19XXXA Unspecified fall, initial encounter: Secondary | ICD-10-CM | POA: Diagnosis not present

## 2017-11-22 DIAGNOSIS — Z79899 Other long term (current) drug therapy: Secondary | ICD-10-CM | POA: Diagnosis not present

## 2017-11-22 DIAGNOSIS — E119 Type 2 diabetes mellitus without complications: Secondary | ICD-10-CM | POA: Diagnosis not present

## 2017-11-22 DIAGNOSIS — Y939 Activity, unspecified: Secondary | ICD-10-CM | POA: Insufficient documentation

## 2017-11-22 DIAGNOSIS — Z7982 Long term (current) use of aspirin: Secondary | ICD-10-CM | POA: Diagnosis not present

## 2017-11-22 DIAGNOSIS — Z87891 Personal history of nicotine dependence: Secondary | ICD-10-CM | POA: Insufficient documentation

## 2017-11-22 DIAGNOSIS — S46911A Strain of unspecified muscle, fascia and tendon at shoulder and upper arm level, right arm, initial encounter: Secondary | ICD-10-CM | POA: Diagnosis not present

## 2017-11-22 DIAGNOSIS — M19011 Primary osteoarthritis, right shoulder: Secondary | ICD-10-CM | POA: Diagnosis not present

## 2017-11-22 DIAGNOSIS — I10 Essential (primary) hypertension: Secondary | ICD-10-CM | POA: Insufficient documentation

## 2017-11-22 DIAGNOSIS — Y998 Other external cause status: Secondary | ICD-10-CM | POA: Diagnosis not present

## 2017-11-22 DIAGNOSIS — M25511 Pain in right shoulder: Secondary | ICD-10-CM | POA: Diagnosis not present

## 2017-11-22 MED ORDER — HYDROCODONE-ACETAMINOPHEN 5-325 MG PO TABS: ORAL_TABLET | ORAL | 0 refills | Status: DC

## 2017-11-22 MED ORDER — MELOXICAM 15 MG PO TABS: 15.0000 mg | ORAL_TABLET | Freq: Every day | ORAL | 0 refills | Status: DC

## 2017-11-22 NOTE — ED Provider Notes (Signed)
[facility] EMERGENCY DEPARTMENT Provider Note   CSN: [id number] Arrival date & time: 11/22/17  1401     History   Chief Complaint Chief Complaint  Patient presents with  . Fall    HPI Carlos Leon is a 71 y.o. male.  Patient is a 71 year old male who presents to the emergency department with a complaint of right shoulder pain.  The patient states that 2 weeks ago he sustained a fall in which his shoulder went backwards as he was hitting the ground.  He states that he had some soreness, but sort of work through it.  The problem now is that he has pain that feels like a toothache throbbing at about 3:00 in the morning or when the shoulder gets cold.  He is able to lift, push and pull, but he states he has difficulty with pain.  No previous operations or procedures involving the shoulder.        Past Medical History:  Diagnosis Date  . Arthritis   . Atrial fibrillation ([place])    Present by ECG 01/2012  . Colon polyps   . Diverticulosis of colon   . Essential hypertension, benign   . Gout   . Hyperlipidemia   . Obesity   . Type 2 diabetes mellitus [facility])     Patient Active Problem List   Diagnosis Date Noted  . Annual physical exam 05/20/2016  . Metabolic syndrome X 10/20/8526  . Asymptomatic proteinuria 08/08/2013  . Elevated PSA 11/19/2012  . Atrial fibrillation ([place]) 02/07/2012  . History of colon polyps 10/27/2011  . 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 Hyperlipemia 08/26/2009  . Morbid obesity ([place]) 04/18/2009  . Prediabetes 10/21/2008  . GOUT, UNSPECIFIED 10/21/2008  . Essential hypertension, benign 10/21/2008    Past Surgical History:  Procedure Laterality Date  . Colon polyps removed  2002   Dr. Tamala Julian  . COLONOSCOPY  2004   Dr. Misty Stanley prep, ACBE showed diverticulosis  . COLONOSCOPY  11/24/2011   Procedure: COLONOSCOPY;  Surgeon: Daneil Dolin, MD;  [facility];  Service: Endoscopy;  Laterality: N/A;  9:10  . HERNIA  REPAIR         Home Medications    Prior to Admission medications   Medication Sig Start Date End Date Taking? Authorizing Provider  allopurinol (ZYLOPRIM) 300 MG tablet Take 1 tablet (300 mg total) by mouth daily. 05/05/17   [name], MD  amLODipine (NORVASC) 10 MG tablet TAKE 1 TABLET EVERY DAY 10/24/17   [name], MD  aspirin EC 81 MG tablet Take 1 tablet (81 mg total) by mouth daily. 03/18/14   [name], MD  benazepril (LOTENSIN) 20 MG tablet TAKE 2 TABLETS (40 MG TOTAL) BY MOUTH DAILY. 06/14/17   [name], MD  benazepril (LOTENSIN) 20 MG tablet Two tablets once daily 10/03/17   [name], MD  HYDROcodone-acetaminophen (NORCO/VICODIN) 5-325 MG tablet 1 at hs for pain 11/22/17   Lily Kocher, PA-C  meloxicam (MOBIC) 15 MG tablet Take 1 tablet (15 mg total) by mouth daily. 11/22/17   Lily Kocher, PA-C    Family History Family History  Problem Relation Age of Onset  . Hypertension Mother   . Cancer Mother        Rectal, approximately 20  . Diabetes Father   . Liver disease Neg Hx   . Colon cancer Neg Hx     Social History Social History   Tobacco Use  . Smoking status:  Former Smoker    Packs/day: 2.00    Years: 5.00    Pack years: 10.00    Types: Cigarettes  . Smokeless tobacco: Never Used  Substance Use Topics  . Alcohol use: No  . Drug use: No     Allergies   Patient has no known allergies.   Review of Systems Review of Systems  Constitutional: Negative for activity change.       All ROS Neg except as noted in HPI  HENT: Negative for nosebleeds.   Eyes: Negative for photophobia and discharge.  Respiratory: Negative for cough, shortness of breath and wheezing.   Cardiovascular: Negative for chest pain and palpitations.  Gastrointestinal: Negative for abdominal pain and blood in stool.  Genitourinary: Negative for dysuria, frequency and hematuria.  Musculoskeletal: Positive for arthralgias. Negative  for back pain and neck pain.  Skin: Negative.   Neurological: Negative for dizziness, seizures and speech difficulty.  Psychiatric/Behavioral: Negative for confusion and hallucinations.     Physical Exam Updated Vital Signs BP 129/74 (BP Location: Right Arm)   Pulse 86   Temp 98.3 F (36.8 C) (Oral)   Resp (!) 24   Ht 6\' 2"  (1.88 m)   Wt 124.3 kg (274 lb)   SpO2 98%   BMI 35.18 kg/m   Physical Exam  Constitutional: He is oriented to person, place, and time. He appears well-developed and well-nourished.  Non-toxic appearance.  HENT:  Head: Normocephalic.  Right Ear: Tympanic membrane and external ear normal.  Left Ear: Tympanic membrane and external ear normal.  Eyes: EOM and lids are normal. Pupils are equal, round, and reactive to light.  Neck: Normal range of motion. Neck supple. Carotid bruit is not present.  Cardiovascular: Normal rate, regular rhythm, normal heart sounds, intact distal pulses and normal pulses.  Pulmonary/Chest: Breath sounds normal. No respiratory distress.  Abdominal: Soft. Bowel sounds are normal. There is no tenderness. There is no guarding.  Musculoskeletal: Normal range of motion.  The radial pulses 2+.  Capillary refill is less than 2 seconds.  There is good range of motion of the right wrist.  There is full range of motion of the right elbow, with mild crepitus.  There is crepitus with range of motion right shoulder.  There is no evidence of dislocation.  There is no dislocation of the scapula.  There is no deformity of the clavicle.  There is pain with range of motion.  There is pain with resistance to adduction and abduction of the right shoulder.  There is full range of motion of the left upper extremity, but with some crepitus present.  Lymphadenopathy:       Head (right side): No submandibular adenopathy present.       Head (left side): No submandibular adenopathy present.    He has no cervical adenopathy.  Neurological: He is alert and oriented  to person, place, and time. He has normal strength. No cranial nerve deficit or sensory deficit.  Skin: Skin is warm and dry.  Psychiatric: He has a normal mood and affect. His speech is normal.  Nursing note and vitals reviewed.    ED Treatments / Results  Labs (all labs ordered are listed, but only abnormal results are displayed) Labs Reviewed - No data to display  EKG  EKG Interpretation None       Radiology Dg Shoulder Right  Result Date: 11/22/2017 CLINICAL DATA:  Fall.  Pain. EXAM: RIGHT SHOULDER - 2+ VIEW COMPARISON:  No recent prior. FINDINGS: Acromioclavicular  and glenohumeral degenerative change. Downsloping acromion with subacromial spurring noted. Calcific densities noted over the region the supraspinatus tendon. Calcific supraspinatus tendinosis cannot be excluded. Right shoulder is high-riding suggesting chronic rotator cuff tear . No acute abnormality. No evidence of fracture, dislocation, or separation . IMPRESSION: 1. No acute abnormality. 2. Acromioclavicular glenohumeral degenerative change. Downsloping acromion subacromial spurring. Calcific supraspinatus tendinosis cannot be excluded. Right shoulder is high-riding suggesting chronic rotator cuff tear. Electronically Signed   By: Marcello Moores  Register   On: 11/22/2017 14:24    Procedures Procedures (including critical care time)  Medications Ordered in ED Medications - No data to display   Initial Impression / Assessment and Plan / ED Course  I have reviewed the triage vital signs and the nursing notes.  Pertinent labs & imaging results that were available during my care of the patient were reviewed by me and considered in my medical decision making (see chart for details).       Final Clinical Impressions(s) / ED Diagnoses MDM Patient sustained a fall approximately 2 weeks ago.  He continues to have pain mostly at night in the right shoulder.  The x-ray reveals acromioclavicular and glenohumeral  degenerative changes.  There is a calcific densities noted over the supraspinatus tendon.  There is question of chronic rotator cuff injury.  I have asked the patient to see Dr. Aline Brochure for orthopedic evaluation and management of this pain.  Patient will be treated with few days of Mobic and Norco at bedtime.  Patient is to return to the emergency department if any changes, problems, or concerns.   Final diagnoses:  Shoulder strain, right, initial encounter  Primary osteoarthritis of right shoulder    ED Discharge Orders        Ordered    HYDROcodone-acetaminophen (NORCO/VICODIN) 5-325 MG tablet     11/22/17 1608    meloxicam (MOBIC) 15 MG tablet  Daily     11/22/17 1608       Lily Kocher, PA-C 11/22/17 1616    Nat Christen, MD 11/25/17 2104

## 2017-11-22 NOTE — Discharge Instructions (Signed)
Your x-ray is negative for fracture or dislocation.  Your examination is consistent with a strain/sprain of the right shoulder.  Your x-ray does show calcium with in the shoulder joint consistent with arthritis.  Please use a heating pad to your shoulder when possible.  Use Mobic at bedtime as well as norco at bed time.  Norco may cause drowsiness, and/or lightheadedness.  Please use caution in getting around after taking this medication.  Please see Dr. Aline Brochure in the office for evaluation of your shoulder as soon as possible.

## 2017-11-22 NOTE — ED Triage Notes (Signed)
Pt with a fall 2 weeks and right shoulder pain, pain has gotten better but aches at night.

## 2017-11-28 ENCOUNTER — Ambulatory Visit: Payer: Medicare HMO

## 2017-11-29 ENCOUNTER — Encounter: Payer: Self-pay | Admitting: Family Medicine

## 2018-01-02 ENCOUNTER — Ambulatory Visit: Payer: Medicare HMO

## 2018-01-02 ENCOUNTER — Ambulatory Visit (INDEPENDENT_AMBULATORY_CARE_PROVIDER_SITE_OTHER): Payer: Medicare HMO

## 2018-01-02 VITALS — BP 124/82 | HR 63 | Temp 98.9°F | Resp 16 | Ht 74.0 in | Wt 285.2 lb

## 2018-01-02 DIAGNOSIS — Z0000 Encounter for general adult medical examination without abnormal findings: Secondary | ICD-10-CM

## 2018-01-02 NOTE — Progress Notes (Signed)
Subjective:   Carlos Leon is a 72 y.o. male who presents for Medicare Annual/Subsequent preventive examination.  Review of Systems:   Cardiac Risk Factors include: male gender;advanced age (>42men, >49 women);sedentary lifestyle;obesity (BMI >30kg/m2)     Objective:    Vitals: BP 124/82 (BP Location: Left Arm, Patient Position: Sitting, Cuff Size: Normal)   Pulse 63   Temp 98.9 F (37.2 C) (Temporal)   Resp 16   Ht 6\' 2"  (1.88 m)   Wt 285 lb 4 oz (129.4 kg)   SpO2 98%   BMI 36.62 kg/m   Body mass index is 36.62 kg/m.  Advanced Directives 01/02/2018 08/20/2014 03/18/2014 11/24/2011  Does Patient Have a Medical Advance Directive? No No Patient does not have advance directive;Patient would like information Patient does not have advance directive;Patient would like information  Would patient like information on creating a medical advance directive? Yes (MAU/Ambulatory/Procedural Areas - Information given) Yes - Educational materials given Advance directive packet given Advance directive packet given  Pre-existing out of facility DNR order (yellow form or pink MOST form) - - - No    Tobacco Social History   Tobacco Use  Smoking Status Former Smoker  . Packs/day: 2.00  . Years: 5.00  . Pack years: 10.00  . Types: Cigarettes  Smokeless Tobacco Never Used     Counseling given: Not Answered   Clinical Intake:  Pre-visit preparation completed: Yes  Pain : No/denies pain Pain Score: 0-No pain     Diabetes: No  How often do you need to have someone help you when you read instructions, pamphlets, or other written materials from your doctor or pharmacy?: 1 - Never  Interpreter Needed?: No     Past Medical History:  Diagnosis Date  . Arthritis   . Atrial fibrillation ([place])    Present by ECG 01/2012  . Colon polyps   . Diverticulosis of colon   . Essential hypertension, benign   . Gout   . Hyperlipidemia   . Obesity   . Type 2 diabetes mellitus ([place])     Past Surgical History:  Procedure Laterality Date  . Colon polyps removed  2002   Dr. Tamala Julian  . COLONOSCOPY  2004   Dr. Misty Stanley prep, ACBE showed diverticulosis  . COLONOSCOPY  11/24/2011   Procedure: COLONOSCOPY;  Surgeon: Daneil Dolin, MD;  Location[facility];  Service: Endoscopy;  Laterality: N/A;  9:10  . HERNIA REPAIR     Family History  Problem Relation Age of Onset  . Hypertension Mother   . Cancer Mother        Rectal, approximately 29  . Diabetes Father   . Liver disease Neg Hx   . Colon cancer Neg Hx    Social History   Socioeconomic History  . Marital status: Married    Spouse name: None  . Number of children: 4  . Years of education: None  . Highest education level: None  Social Needs  . Financial resource strain: Not hard at all  . Food insecurity - worry: Never true  . Food insecurity - inability: Never true  . Transportation needs - medical: No  . Transportation needs - non-medical: No  Occupational History  . Occupation: Retired  Tobacco Use  . Smoking status: Former Smoker    Packs/day: 2.00    Years: 5.00    Pack years: 10.00    Types: Cigarettes  . Smokeless tobacco: Never Used  Substance and Sexual Activity  . Alcohol use:  No  . Drug use: No  . Sexual activity: Yes  Other Topics Concern  . None  Social History Narrative  . None    Outpatient Encounter Medications as of 01/02/2018  Medication Sig  . allopurinol (ZYLOPRIM) 300 MG tablet Take 1 tablet (300 mg total) by mouth daily.  Marland Kitchen amLODipine (NORVASC) 10 MG tablet TAKE 1 TABLET EVERY DAY  . aspirin EC 81 MG tablet Take 1 tablet (81 mg total) by mouth daily.  . benazepril (LOTENSIN) 20 MG tablet TAKE 2 TABLETS (40 MG TOTAL) BY MOUTH DAILY.  Marland Kitchen HYDROcodone-acetaminophen (NORCO/VICODIN) 5-325 MG tablet 1 at hs for pain (Patient not taking: Reported on 01/02/2018)  . meloxicam (MOBIC) 15 MG tablet Take 1 tablet (15 mg total) by mouth daily. (Patient not taking: Reported on  01/02/2018)  . [DISCONTINUED] benazepril (LOTENSIN) 20 MG tablet Two tablets once daily   No facility-administered encounter medications on file as of 01/02/2018.     Activities of Daily Living In your present state of health, do you have any difficulty performing the following activities: 01/02/2018  Hearing? N  Vision? N  Difficulty concentrating or making decisions? N  Walking or climbing stairs? N  Dressing or bathing? N  Doing errands, shopping? N  Preparing Food and eating ? N  Using the Toilet? N  In the past six months, have you accidently leaked urine? Y  Do you have problems with loss of bowel control? N  Managing your Medications? N  Managing your Finances? N  Housekeeping or managing your Housekeeping? N  Some recent data might be hidden    Patient Care Team: [name], MD as PCP - General   Assessment:   This is a routine wellness examination for [place].  Exercise Activities and Dietary recommendations Current Exercise Habits: Home exercise routine, Time (Minutes): 30, Frequency (Times/Week): 4, Weekly Exercise (Minutes/Week): 120, Intensity: Mild, Exercise limited by: None identified  Goals    None      Fall Risk Fall Risk  01/02/2018 04/21/2017 09/15/2015 08/20/2014  Falls in the past year? Yes No No No  Number falls in past yr: 1 - - -  Injury with Fall? Yes - - -   Is the patient's home free of loose throw rugs in walkways, pet beds, electrical cords, etc?   yes      Grab bars in the bathroom? yes      Handrails on the stairs?   No stairs in home      Adequate lighting?   yes    Depression Screen PHQ 2/9 Scores 01/02/2018 04/21/2017 09/15/2015 08/20/2014  PHQ - 2 Score 0 0 0 0    Cognitive Function        Immunization History  Administered Date(s) Administered  . Pneumococcal Conjugate-13 08/20/2014  . Pneumococcal Polysaccharide-23 02/07/2012  . Tdap 02/07/2012    Qualifies for Shingles Vaccine? Yes  Screening Tests Health Maintenance   Topic Date Due  . HEMOGLOBIN A1C  10/30/2017  . INFLUENZA VACCINE  11/07/2019 (Originally 07/27/2017)  . COLONOSCOPY  11/23/2021  . TETANUS/TDAP  02/06/2022  . Hepatitis C Screening  Completed  . PNA vac Low Risk Adult  Completed   Cancer Screenings: Lung: Low Dose CT Chest recommended if Age 72-80 years, 30 pack-year currently smoking OR have quit w/in 15years. Patient does not qualify. Colorectal: Patient will discuss with PCP      Plan:     I have personally reviewed and noted the following in the patient's chart:   .  Medical and social history . Use of alcohol, tobacco or illicit drugs  . Current medications and supplements . Functional ability and status . Nutritional status . Physical activity . Advanced directives . List of other physicians . Hospitalizations, surgeries, and ER visits in previous 12 months . Vitals . Screenings to include cognitive, depression, and falls . Referrals and appointments  In addition, I have reviewed and discussed with patient certain preventive protocols, quality metrics, and best practice recommendations. A written personalized care plan for preventive services as well as general preventive health recommendations were provided to patient.     Merceda Elks, LPN  03/04/7672

## 2018-01-02 NOTE — Patient Instructions (Signed)
[name] , Thank you for taking time to come for your Medicare Wellness Visit. I appreciate your ongoing commitment to your health goals. Please review the following plan we discussed and let me know if I can assist you in the future.   Screening recommendations/referrals: Colonoscopy: Discuss with Dr. Moshe Cipro Recommended yearly ophthalmology/optometry visit for glaucoma screening and checkup Recommended yearly dental visit for hygiene and checkup  Vaccinations: Influenza vaccine: Due today- decline Pneumococcal vaccine: 2020 Tdap vaccine: 2023 Shingles vaccine: Discuss with PCP    Advanced directives: Discussed today in office  Conditions/risks identified: Fall  Next appointment: 4/4/219  Preventive Care 65 Years and Older, Male Preventive care refers to lifestyle choices and visits with your health care provider that can promote health and wellness. What does preventive care include?  A yearly physical exam. This is also called an annual well check.  Dental exams once or twice a year.  Routine eye exams. Ask your health care provider how often you should have your eyes checked.  Personal lifestyle choices, including:  Daily care of your teeth and gums.  Regular physical activity.  Eating a healthy diet.  Avoiding tobacco and drug use.  Limiting alcohol use.  Practicing safe sex.  Taking low doses of aspirin every day.  Taking vitamin and mineral supplements as recommended by your health care provider. What happens during an annual well check? The services and screenings done by your health care provider during your annual well check will depend on your age, overall health, lifestyle risk factors, and family history of disease. Counseling  Your health care provider may ask you questions about your:  Alcohol use.  Tobacco use.  Drug use.  Emotional well-being.  Home and relationship well-being.  Sexual activity.  Eating habits.  History of  falls.  Memory and ability to understand (cognition).  Work and work Statistician. Screening  You may have the following tests or measurements:  Height, weight, and BMI.  Blood pressure.  Lipid and cholesterol levels. These may be checked every 5 years, or more frequently if you are over 3 years old.  Skin check.  Lung cancer screening. You may have this screening every year starting at age 75 if you have a 30-pack-year history of smoking and currently smoke or have quit within the past 15 years.  Fecal occult blood test (FOBT) of the stool. You may have this test every year starting at age 14.  Flexible sigmoidoscopy or colonoscopy. You may have a sigmoidoscopy every 5 years or a colonoscopy every 10 years starting at age 31.  Prostate cancer screening. Recommendations will vary depending on your family history and other risks.  Hepatitis C blood test.  Hepatitis B blood test.  Sexually transmitted disease (STD) testing.  Diabetes screening. This is done by checking your blood sugar (glucose) after you have not eaten for a while (fasting). You may have this done every 1-3 years.  Abdominal aortic aneurysm (AAA) screening. You may need this if you are a current or former smoker.  Osteoporosis. You may be screened starting at age 83 if you are at high risk. Talk with your health care provider about your test results, treatment options, and if necessary, the need for more tests. Vaccines  Your health care provider may recommend certain vaccines, such as:  Influenza vaccine. This is recommended every year.  Tetanus, diphtheria, and acellular pertussis (Tdap, Td) vaccine. You may need a Td booster every 10 years.  Zoster vaccine. You may need this after  age 16.  Pneumococcal 13-valent conjugate (PCV13) vaccine. One dose is recommended after age 57.  Pneumococcal polysaccharide (PPSV23) vaccine. One dose is recommended after age 61. Talk to your health care provider about  which screenings and vaccines you need and how often you need them. This information is not intended to replace advice given to you by your health care provider. Make sure you discuss any questions you have with your health care provider. Document Released: 01/09/2016 Document Revised: 09/01/2016 Document Reviewed: 10/14/2015 Elsevier Interactive Patient Education  2017 [employer] Prevention in the Home Falls can cause injuries. They can happen to people of all ages. There are many things you can do to make your home safe and to help prevent falls. What can I do on the outside of my home?  Regularly fix the edges of walkways and driveways and fix any cracks.  Remove anything that might make you trip as you walk through a door, such as a raised step or threshold.  Trim any bushes or trees on the path to your home.  Use bright outdoor lighting.  Clear any walking paths of anything that might make someone trip, such as rocks or tools.  Regularly check to see if handrails are loose or broken. Make sure that both sides of any steps have handrails.  Any raised decks and porches should have guardrails on the edges.  Have any leaves, snow, or ice cleared regularly.  Use sand or salt on walking paths during winter.  Clean up any spills in your garage right away. This includes oil or grease spills. What can I do in the bathroom?  Use night lights.  Install grab bars by the toilet and in the tub and shower. Do not use towel bars as grab bars.  Use non-skid mats or decals in the tub or shower.  If you need to sit down in the shower, use a plastic, non-slip stool.  Keep the floor dry. Clean up any water that spills on the floor as soon as it happens.  Remove soap buildup in the tub or shower regularly.  Attach bath mats securely with double-sided non-slip rug tape.  Do not have throw rugs and other things on the floor that can make you trip. What can I do in the  bedroom?  Use night lights.  Make sure that you have a light by your bed that is easy to reach.  Do not use any sheets or blankets that are too big for your bed. They should not hang down onto the floor.  Have a firm chair that has side arms. You can use this for support while you get dressed.  Do not have throw rugs and other things on the floor that can make you trip. What can I do in the kitchen?  Clean up any spills right away.  Avoid walking on wet floors.  Keep items that you use a lot in easy-to-reach places.  If you need to reach something above you, use a strong step stool that has a grab bar.  Keep electrical cords out of the way.  Do not use floor polish or wax that makes floors slippery. If you must use wax, use non-skid floor wax.  Do not have throw rugs and other things on the floor that can make you trip. What can I do with my stairs?  Do not leave any items on the stairs.  Make sure that there are handrails on both sides of the stairs  and use them. Fix handrails that are broken or loose. Make sure that handrails are as long as the stairways.  Check any carpeting to make sure that it is firmly attached to the stairs. Fix any carpet that is loose or worn.  Avoid having throw rugs at the top or bottom of the stairs. If you do have throw rugs, attach them to the floor with carpet tape.  Make sure that you have a light switch at the top of the stairs and the bottom of the stairs. If you do not have them, ask someone to add them for you. What else can I do to help prevent falls?  Wear shoes that:  Do not have high heels.  Have rubber bottoms.  Are comfortable and fit you well.  Are closed at the toe. Do not wear sandals.  If you use a stepladder:  Make sure that it is fully opened. Do not climb a closed stepladder.  Make sure that both sides of the stepladder are locked into place.  Ask someone to hold it for you, if possible.  Clearly mark and make  sure that you can see:  Any grab bars or handrails.  First and last steps.  Where the edge of each step is.  Use tools that help you move around (mobility aids) if they are needed. These include:  Canes.  Walkers.  Scooters.  Crutches.  Turn on the lights when you go into a dark area. Replace any light bulbs as soon as they burn out.  Set up your furniture so you have a clear path. Avoid moving your furniture around.  If any of your floors are uneven, fix them.  If there are any pets around you, be aware of where they are.  Review your medicines with your doctor. Some medicines can make you feel dizzy. This can increase your chance of falling. Ask your doctor what other things that you can do to help prevent falls. This information is not intended to replace advice given to you by your health care provider. Make sure you discuss any questions you have with your health care provider. Document Released: 10/09/2009 Document Revised: 05/20/2016 Document Reviewed: 01/17/2015 Elsevier Interactive Patient Education  2017 [employer].

## 2018-03-30 ENCOUNTER — Ambulatory Visit: Payer: Medicare HMO | Admitting: Family Medicine

## 2018-04-04 ENCOUNTER — Other Ambulatory Visit: Payer: Self-pay | Admitting: Family Medicine

## 2018-04-13 ENCOUNTER — Ambulatory Visit: Payer: Medicare HMO | Admitting: Family Medicine

## 2018-09-19 ENCOUNTER — Ambulatory Visit: Payer: Medicare HMO | Admitting: Family Medicine

## 2018-09-27 ENCOUNTER — Other Ambulatory Visit: Payer: Self-pay

## 2018-09-27 ENCOUNTER — Encounter: Payer: Self-pay | Admitting: Family Medicine

## 2018-09-27 ENCOUNTER — Ambulatory Visit (INDEPENDENT_AMBULATORY_CARE_PROVIDER_SITE_OTHER): Payer: Medicare HMO | Admitting: Family Medicine

## 2018-09-27 VITALS — BP 136/80 | HR 92 | Resp 12 | Ht 74.0 in | Wt 277.1 lb

## 2018-09-27 DIAGNOSIS — E785 Hyperlipidemia, unspecified: Secondary | ICD-10-CM

## 2018-09-27 DIAGNOSIS — R3912 Poor urinary stream: Secondary | ICD-10-CM | POA: Diagnosis not present

## 2018-09-27 DIAGNOSIS — H547 Unspecified visual loss: Secondary | ICD-10-CM

## 2018-09-27 DIAGNOSIS — E790 Hyperuricemia without signs of inflammatory arthritis and tophaceous disease: Secondary | ICD-10-CM

## 2018-09-27 DIAGNOSIS — M109 Gout, unspecified: Secondary | ICD-10-CM

## 2018-09-27 DIAGNOSIS — R351 Nocturia: Secondary | ICD-10-CM | POA: Diagnosis not present

## 2018-09-27 DIAGNOSIS — R7303 Prediabetes: Secondary | ICD-10-CM | POA: Diagnosis not present

## 2018-09-27 DIAGNOSIS — I10 Essential (primary) hypertension: Secondary | ICD-10-CM

## 2018-09-27 DIAGNOSIS — I4891 Unspecified atrial fibrillation: Secondary | ICD-10-CM

## 2018-09-27 DIAGNOSIS — E8881 Metabolic syndrome: Secondary | ICD-10-CM

## 2018-09-27 DIAGNOSIS — R972 Elevated prostate specific antigen [PSA]: Secondary | ICD-10-CM

## 2018-09-27 LAB — POCT URINALYSIS DIPSTICK
Bilirubin, UA: NEGATIVE
GLUCOSE UA: NEGATIVE
Ketones, UA: NEGATIVE
LEUKOCYTES UA: NEGATIVE
Nitrite, UA: NEGATIVE
Protein, UA: POSITIVE — AB
RBC UA: NEGATIVE
Spec Grav, UA: 1.02 (ref 1.010–1.025)
Urobilinogen, UA: 0.2 E.U./dL
pH, UA: 6.5 (ref 5.0–8.0)

## 2018-09-27 MED ORDER — TAMSULOSIN HCL 0.4 MG PO CAPS: ORAL_CAPSULE | ORAL | 3 refills | Status: DC

## 2018-09-27 NOTE — Patient Instructions (Addendum)
Physical  Exam to be scheduled mid to  end November, call if you  need me before, pls give pt tele # for [facility] on [place] medication , prescribed to help with flow and sent to your pharmacy  Please get fasting labs including uric acid, and TSH on October 7  You are referred for eye exam, you may call and make your appt at" [facility] " on [address]  Keep up good health habits  It is important that you exercise regularly at least 30 minutes 5 times a week. If you develop chest pain, have severe difficulty breathing, or feel very tired, stop exercising immediately and seek medical attention

## 2018-09-27 NOTE — Progress Notes (Signed)
Carlos Leon     MRN: [id number]      DOB: 10-08-1946   HPI [name] is here with 8 month h/o poor urinary stream and nocturia, about 3 times at night , no dysuriia, fever, or chills, no hematuria. Has worked on diet and exercise with a little weight loss , which is good. Refuses vaccine despite education  ROS Denies recent fever or chills. Denies sinus pressure, nasal congestion, ear pain or sore throat. Denies chest congestion, productive cough or wheezing. Denies chest pains, palpitations and leg swelling Denies abdominal pain, nausea, vomiting,diarrhea or constipation.    Denies joint pain, swelling and limitation in mobility. Denies headaches, seizures, numbness, or tingling. Denies depression, anxiety or insomnia. Denies skin break down or rash.   PE BP 136/80   Pulse 92   Resp 12   Ht 6\' 2"  (1.88 m)   Wt 277 lb 1.9 oz (125.7 kg)   SpO2 97% Comment: room air  BMI 35.58 kg/m    Patient alert and oriented and in no cardiopulmonary distress.  HEENT: No facial asymmetry, EOMI,   oropharynx pink and moist.  Neck supple no JVD, no mass.  Chest: Clear to auscultation bilaterally.  CVS: S1, S2 no murmurs, no S3.Irregularly , IrRegular rate.  ABD: Soft non tender.   Ext: No edema  MS: Adequate though reduced  ROM spine, shoulders, hips and knees.  Skin: Intact, no ulcerations or rash noted.  Psych: Good eye contact, normal affect. Memory intact not anxious or depressed appearing.  CNS: CN 2-12 intact, power,  normal throughout.no focal deficits noted.   Assessment & Plan Essential hypertension, benign  Controlled, no change in medication DASH diet and commitment to daily physical activity for a minimum of 30 minutes discussed and encouraged, as a part of hypertension management. The importance of attaining a healthy weight is also discussed.  BP/Weight 09/27/2018 01/02/2018 11/22/2017 09/29/2017 04/21/2017 11/24/2016 3/41/9622  Systolic BP 297 989 211 941  740 814 481  Diastolic BP 80 82 74 86 78 82 82  Wt. (Lbs) 277.12 285.25 274 285.25 286 282 275  BMI 35.58 36.62 35.18 36.62 36.72 36.21 35.31        Morbid obesity Improved Patient re-educated about  the importance of commitment to a  minimum of 150 minutes of exercise per week.  The importance of healthy food choices with portion control discussed. Encouraged to start a food diary, count calories and to consider  joining a support group. Sample diet sheets offered. Goals set by the patient for the next several months.   Weight /BMI 09/27/2018 01/02/2018 11/22/2017  WEIGHT 277 lb 1.9 oz 285 lb 4 oz 274 lb  HEIGHT 6\' 2"  6\' 2"  6\' 2"   BMI 35.58 kg/m2 36.62 kg/m2 35.18 kg/m2      Prediabetes Patient educated about the importance of limiting  Carbohydrate intake , the need to commit to daily physical activity for a minimum of 30 minutes , and to commit weight loss. The fact that changes in all these areas will reduce or eliminate all together the development of diabetes is stressed.  Updated lab needed at/ before next visit.  Diabetic Labs Latest Ref Rng & Units 10/01/2017 04/29/2017 12/24/2016 09/16/2016 05/20/2016  HbA1c <5.7 % - 6.0(H) - 6.0(H) 6.2(H)  Microalbumin 0.00 - 1.89 mg/dL - - - - -  Micro/Creat Ratio 0.0 - 30.0 mg/g - - - - -  Chol <200 mg/dL - 185 - 197 -  HDL >40 mg/dL - 48 -  52 -  Calc LDL <100 mg/dL - 116(H) - 125 -  Triglycerides <150 mg/dL - 103 - 98 -  Creatinine 0.70 - 1.18 mg/dL 1.18 1.15 1.50(H) 1.25(H) 1.35(H)   BP/Weight 09/27/2018 01/02/2018 11/22/2017 09/29/2017 04/21/2017 11/24/2016 03/05/4075  Systolic BP 808 811 031 594 585 929 244  Diastolic BP 80 82 74 86 78 82 82  Wt. (Lbs) 277.12 285.25 274 285.25 286 282 275  BMI 35.58 36.62 35.18 36.62 36.72 36.21 35.31   Foot/eye exam completion dates 05/20/2016  Foot Form Completion Done      GOUT, UNSPECIFIED No recent flare, need to check uric acid level  Elevated PSA Updated lab needed, now experiencing  worsening obstructive symptoms, start flomax and re check PSA   Atrial fibrillation Coumadin needed, however pt refuses , despite oif his increased risk of stroke, which he clearly understands  Poor urinary stream Progressively worsening in past 6 months, trial of flomax and check PSA

## 2018-09-30 ENCOUNTER — Encounter: Payer: Self-pay | Admitting: Family Medicine

## 2018-09-30 DIAGNOSIS — R3912 Poor urinary stream: Secondary | ICD-10-CM | POA: Insufficient documentation

## 2018-09-30 NOTE — Assessment & Plan Note (Addendum)
Controlled, no change in medication DASH diet and commitment to daily physical activity for a minimum of 30 minutes discussed and encouraged, as a part of hypertension management. The importance of attaining a healthy weight is also discussed.  BP/Weight 09/27/2018 01/02/2018 11/22/2017 09/29/2017 04/21/2017 11/24/2016 09/19/4143  Systolic BP 360 165 800 634 949 447 395  Diastolic BP 80 82 74 86 78 82 82  Wt. (Lbs) 277.12 285.25 274 285.25 286 282 275  BMI 35.58 36.62 35.18 36.62 36.72 36.21 35.31

## 2018-09-30 NOTE — Assessment & Plan Note (Signed)
No recent flare, need to check uric acid level

## 2018-09-30 NOTE — Assessment & Plan Note (Signed)
Updated lab needed, now experiencing worsening obstructive symptoms, start flomax and re check PSA

## 2018-09-30 NOTE — Assessment & Plan Note (Signed)
Patient educated about the importance of limiting  Carbohydrate intake , the need to commit to daily physical activity for a minimum of 30 minutes , and to commit weight loss. The fact that changes in all these areas will reduce or eliminate all together the development of diabetes is stressed.  Updated lab needed at/ before next visit.  Diabetic Labs Latest Ref Rng & Units 10/01/2017 04/29/2017 12/24/2016 09/16/2016 05/20/2016  HbA1c <5.7 % - 6.0(H) - 6.0(H) 6.2(H)  Microalbumin 0.00 - 1.89 mg/dL - - - - -  Micro/Creat Ratio 0.0 - 30.0 mg/g - - - - -  Chol <200 mg/dL - 185 - 197 -  HDL >40 mg/dL - 48 - 52 -  Calc LDL <100 mg/dL - 116(H) - 125 -  Triglycerides <150 mg/dL - 103 - 98 -  Creatinine 0.70 - 1.18 mg/dL 1.18 1.15 1.50(H) 1.25(H) 1.35(H)   BP/Weight 09/27/2018 01/02/2018 11/22/2017 09/29/2017 04/21/2017 11/24/2016 2/91/9166  Systolic BP 060 045 997 741 423 953 202  Diastolic BP 80 82 74 86 78 82 82  Wt. (Lbs) 277.12 285.25 274 285.25 286 282 275  BMI 35.58 36.62 35.18 36.62 36.72 36.21 35.31   Foot/eye exam completion dates 05/20/2016  Foot Form Completion Done

## 2018-09-30 NOTE — Assessment & Plan Note (Signed)
Coumadin needed, however pt refuses , despite oif his increased risk of stroke, which he clearly understands

## 2018-09-30 NOTE — Assessment & Plan Note (Signed)
Progressively worsening in past 6 months, trial of flomax and check PSA

## 2018-09-30 NOTE — Assessment & Plan Note (Signed)
Improved Patient re-educated about  the importance of commitment to a  minimum of 150 minutes of exercise per week.  The importance of healthy food choices with portion control discussed. Encouraged to start a food diary, count calories and to consider  joining a support group. Sample diet sheets offered. Goals set by the patient for the next several months.   Weight /BMI 09/27/2018 01/02/2018 11/22/2017  WEIGHT 277 lb 1.9 oz 285 lb 4 oz 274 lb  HEIGHT 6\' 2"  6\' 2"  6\' 2"   BMI 35.58 kg/m2 36.62 kg/m2 35.18 kg/m2

## 2018-10-03 DIAGNOSIS — R7303 Prediabetes: Secondary | ICD-10-CM | POA: Diagnosis not present

## 2018-10-03 DIAGNOSIS — E785 Hyperlipidemia, unspecified: Secondary | ICD-10-CM | POA: Diagnosis not present

## 2018-10-03 DIAGNOSIS — D509 Iron deficiency anemia, unspecified: Secondary | ICD-10-CM | POA: Diagnosis not present

## 2018-10-03 DIAGNOSIS — R972 Elevated prostate specific antigen [PSA]: Secondary | ICD-10-CM | POA: Diagnosis not present

## 2018-10-03 DIAGNOSIS — I10 Essential (primary) hypertension: Secondary | ICD-10-CM | POA: Diagnosis not present

## 2018-10-05 LAB — COMPLETE METABOLIC PANEL WITH GFR
AG RATIO: 1.9 (calc) (ref 1.0–2.5)
ALBUMIN MSPROF: 4.2 g/dL (ref 3.6–5.1)
ALT: 20 U/L (ref 9–46)
AST: 18 U/L (ref 10–35)
Alkaline phosphatase (APISO): 98 U/L (ref 40–115)
BILIRUBIN TOTAL: 0.5 mg/dL (ref 0.2–1.2)
BUN / CREAT RATIO: 11 (calc) (ref 6–22)
BUN: 13 mg/dL (ref 7–25)
CHLORIDE: 106 mmol/L (ref 98–110)
CO2: 28 mmol/L (ref 20–32)
Calcium: 9.3 mg/dL (ref 8.6–10.3)
Creat: 1.19 mg/dL — ABNORMAL HIGH (ref 0.70–1.18)
GFR, EST AFRICAN AMERICAN: 70 mL/min/{1.73_m2} (ref >=60)
GFR, Est Non African American: 61 mL/min/{1.73_m2} (ref >=60)
GLOBULIN: 2.2 g/dL (ref 1.9–3.7)
Glucose, Bld: 110 mg/dL — ABNORMAL HIGH (ref 65–99)
POTASSIUM: 4.7 mmol/L (ref 3.5–5.3)
SODIUM: 139 mmol/L (ref 135–146)
TOTAL PROTEIN: 6.4 g/dL (ref 6.1–8.1)

## 2018-10-05 LAB — PSA: PSA: 12.6 ng/mL — ABNORMAL HIGH (ref <=4.0)

## 2018-10-05 LAB — CBC
HEMATOCRIT: 39.1 % (ref 38.5–50.0)
Hemoglobin: 13.1 g/dL — ABNORMAL LOW (ref 13.2–17.1)
MCH: 26.3 pg — AB (ref 27.0–33.0)
MCHC: 33.5 g/dL (ref 32.0–36.0)
MCV: 78.5 fL — AB (ref 80.0–100.0)
MPV: 10.6 fL (ref 7.5–12.5)
Platelets: 190 10*3/uL (ref 140–400)
RBC: 4.98 10*6/uL (ref 4.20–5.80)
RDW: 15.1 % — ABNORMAL HIGH (ref 11.0–15.0)
WBC: 7 10*3/uL (ref 3.8–10.8)

## 2018-10-05 LAB — HEMOGLOBIN A1C
EAG (MMOL/L): 7.1 (calc)
HEMOGLOBIN A1C: 6.1 %{Hb} — AB (ref <5.7)
MEAN PLASMA GLUCOSE: 128 (calc)

## 2018-10-05 LAB — LIPID PANEL
Cholesterol: 199 mg/dL (ref <200)
HDL: 45 mg/dL (ref >40)
LDL CHOLESTEROL (CALC): 134 mg/dL — AB
Non-HDL Cholesterol (Calc): 154 mg/dL (calc) — ABNORMAL HIGH (ref <130)
Total CHOL/HDL Ratio: 4.4 (calc) (ref <5.0)
Triglycerides: 98 mg/dL (ref <150)

## 2018-10-05 LAB — TSH: TSH: 1.14 m[IU]/L (ref 0.40–4.50)

## 2018-10-05 LAB — FERRITIN: Ferritin: 161 ng/mL (ref 24–380)

## 2018-10-05 LAB — IRON: Iron: 45 ug/dL — ABNORMAL LOW (ref 50–180)

## 2018-10-05 LAB — URIC ACID: URIC ACID, SERUM: 8.1 mg/dL — AB (ref 4.0–8.0)

## 2018-10-09 ENCOUNTER — Other Ambulatory Visit: Payer: Self-pay | Admitting: Family Medicine

## 2018-11-16 ENCOUNTER — Encounter: Payer: Self-pay | Admitting: Family Medicine

## 2018-11-16 ENCOUNTER — Ambulatory Visit (INDEPENDENT_AMBULATORY_CARE_PROVIDER_SITE_OTHER): Payer: Medicare HMO | Admitting: Family Medicine

## 2018-11-16 VITALS — BP 130/70 | HR 89 | Resp 12 | Ht 73.0 in | Wt 280.0 lb

## 2018-11-16 DIAGNOSIS — D126 Benign neoplasm of colon, unspecified: Secondary | ICD-10-CM

## 2018-11-16 DIAGNOSIS — R7303 Prediabetes: Secondary | ICD-10-CM

## 2018-11-16 DIAGNOSIS — E785 Hyperlipidemia, unspecified: Secondary | ICD-10-CM

## 2018-11-16 DIAGNOSIS — R972 Elevated prostate specific antigen [PSA]: Secondary | ICD-10-CM

## 2018-11-16 DIAGNOSIS — D509 Iron deficiency anemia, unspecified: Secondary | ICD-10-CM

## 2018-11-16 DIAGNOSIS — I10 Essential (primary) hypertension: Secondary | ICD-10-CM

## 2018-11-16 DIAGNOSIS — Z0000 Encounter for general adult medical examination without abnormal findings: Secondary | ICD-10-CM | POA: Diagnosis not present

## 2018-11-16 DIAGNOSIS — E790 Hyperuricemia without signs of inflammatory arthritis and tophaceous disease: Secondary | ICD-10-CM

## 2018-11-16 NOTE — Assessment & Plan Note (Signed)
Prior colonoscopy in 2012 , tubular adenoma, new IDA, needs rept colonoscopy and he agrees

## 2018-11-16 NOTE — Assessment & Plan Note (Signed)

## 2018-11-16 NOTE — Progress Notes (Signed)
   Carlos Leon     MRN: [id number]      DOB: 05-25-1946   HPI: Patient is in for annual physical exam. Need fpor colonoscopy and f/u elevated prostate discussed in detail and the significance of both. He now agrees to follow through on colonoscopy only at this time. Recent labs,  are reviewed.again Immunization is reviewed , he refuses the flu vaccine despite education     PE; BP 130/70   Pulse 89   Resp 12   Ht '6\' 1"'$  (1.854 m)   Wt 280 lb 0.6 oz (127 kg)   SpO2 97% Comment: room air  BMI 36.95 kg/m   Pleasant male, alert and oriented x 3, in no cardio-pulmonary distress. Afebrile. HEENT No facial trauma or asymetry. Sinuses non tender. EOMI, pupils equally reactive to light. External ears normal, tympanic membranes clear. Oropharynx moist, no exudate. Neck: supple, no adenopathy,JVD or thyromegaly.No bruits.  Chest: Clear to ascultation bilaterally.No crackles or wheezes. Non tender to palpation  Breast: No asymetry,no masses. No nipple discharge or inversion. No axillary or supraclavicular adenopathy  Cardiovascular system; Heart sounds normal,  S1 and  S2 ,no S3.  No murmur, or thrill. Apical beat not displaced, pulse is irregularly irregular  Abdomen: Soft, non tender, no organomegaly or masses. No bruits. Bowel sounds normal. No guarding, tenderness or rebound.    Musculoskeletal exam: Full ROM of spine, hips , shoulders and knees. No deformity ,swelling or crepitus noted. No muscle wasting or atrophy.   Neurologic: Cranial nerves 2 to 12 intact. Power, tone ,sensation and reflexes normal throughout. No disturbance in gait. No tremor.  Skin: Intact, no ulceration, erythema , scaling or rash noted. Pigmentation normal throughout  Psych; Normal mood and affect. Judgement and concentration normal   Assessment & Plan:  Annual physical exam Annual exam as documented. Counseling done  re healthy lifestyle involving commitment to 150 minutes  exercise per week, heart healthy diet, and attaining healthy weight.The importance of adequate sleep also discussed. Regular seat belt use and home safety, is also discussed. Changes in health habits are decided on by the patient with goals and time frames  set for achieving them. Immunization and cancer screening needs are specifically addressed at this visit.   Tubular adenoma of colon Prior colonoscopy in 2012 , tubular adenoma, new IDA, needs rept colonoscopy and he agrees  IDA (iron deficiency anemia) Needs to commit to PTC iron at least 3 times weekly, and needs colonoscopy, now agrees to follow through  Elevated PSA Elects to put Urology follow up on hold at this time, states will pursue colonoscopy first, importance of Urology follow up

## 2018-11-16 NOTE — Patient Instructions (Addendum)
Wellness with nurse end March  Pt to get fasting lipid, cmp and EGFR, hBA1C, iron and cBC, and uric acid level 1 week before March appt  MD follow up MD in 6 months, call if you need me before We will stay on Daniel's diet / food choice, we want to save our liver  You are referred for colonoscopy, important to get this  Start taking OTC iron 325 mg one three days per week and take the allopurinol every day   It is important that you exercise regularly at least 30 minutes 5 times a week. If you develop chest pain, have severe difficulty breathing, or feel very tired, stop exercising immediately and seek medical attention    Reconsider flu vaccine

## 2018-11-18 NOTE — Assessment & Plan Note (Signed)
Needs to commit to PTC iron at least 3 times weekly, and needs colonoscopy, now agrees to follow through

## 2018-11-18 NOTE — Assessment & Plan Note (Signed)
Elects to put Urology follow up on hold at this time, states will pursue colonoscopy first, importance of Urology follow up

## 2018-11-19 ENCOUNTER — Encounter: Payer: Self-pay | Admitting: Family Medicine

## 2018-11-20 ENCOUNTER — Encounter: Payer: Self-pay | Admitting: Internal Medicine

## 2018-12-05 ENCOUNTER — Other Ambulatory Visit: Payer: Self-pay | Admitting: Family Medicine

## 2018-12-07 DIAGNOSIS — Z0100 Encounter for examination of eyes and vision without abnormal findings: Secondary | ICD-10-CM | POA: Diagnosis not present

## 2018-12-07 DIAGNOSIS — H5210 Myopia, unspecified eye: Secondary | ICD-10-CM | POA: Diagnosis not present

## 2018-12-26 ENCOUNTER — Encounter: Payer: Self-pay | Admitting: Family Medicine

## 2019-01-09 ENCOUNTER — Telehealth: Payer: Self-pay | Admitting: Internal Medicine

## 2019-01-09 ENCOUNTER — Ambulatory Visit: Payer: Medicare HMO | Admitting: Internal Medicine

## 2019-01-09 ENCOUNTER — Encounter: Payer: Self-pay | Admitting: Internal Medicine

## 2019-01-09 NOTE — Telephone Encounter (Signed)
Patient was a no show and letter sent  °

## 2019-01-15 ENCOUNTER — Encounter: Payer: Self-pay | Admitting: Family Medicine

## 2019-01-15 ENCOUNTER — Ambulatory Visit (HOSPITAL_COMMUNITY)
Admission: RE | Admit: 2019-01-15 | Discharge: 2019-01-15 | Disposition: A | Discharge status: Home / Self Care | Payer: Medicare HMO | Source: Ambulatory Visit | Attending: Family Medicine | Admitting: Family Medicine

## 2019-01-15 ENCOUNTER — Ambulatory Visit (INDEPENDENT_AMBULATORY_CARE_PROVIDER_SITE_OTHER): Payer: Medicare HMO | Admitting: Family Medicine

## 2019-01-15 ENCOUNTER — Ambulatory Visit (INDEPENDENT_AMBULATORY_CARE_PROVIDER_SITE_OTHER): Payer: Medicare HMO

## 2019-01-15 VITALS — BP 130/72 | HR 95 | Resp 14 | Ht 74.0 in | Wt 278.0 lb

## 2019-01-15 VITALS — BP 130/87 | HR 75 | Temp 97.9°F | Resp 12 | Ht 74.0 in | Wt 277.0 lb

## 2019-01-15 DIAGNOSIS — R05 Cough: Secondary | ICD-10-CM | POA: Diagnosis not present

## 2019-01-15 DIAGNOSIS — Z0000 Encounter for general adult medical examination without abnormal findings: Secondary | ICD-10-CM | POA: Diagnosis not present

## 2019-01-15 DIAGNOSIS — J209 Acute bronchitis, unspecified: Secondary | ICD-10-CM | POA: Diagnosis not present

## 2019-01-15 DIAGNOSIS — I10 Essential (primary) hypertension: Secondary | ICD-10-CM | POA: Diagnosis not present

## 2019-01-15 DIAGNOSIS — J0100 Acute maxillary sinusitis, unspecified: Secondary | ICD-10-CM | POA: Diagnosis not present

## 2019-01-15 DIAGNOSIS — I4891 Unspecified atrial fibrillation: Secondary | ICD-10-CM

## 2019-01-15 MED ORDER — CHLORPHENIRAMINE MALEATE 4 MG PO TABS: 4.0000 mg | ORAL_TABLET | Freq: Two times a day (BID) | ORAL | 0 refills | Status: DC | PRN

## 2019-01-15 MED ORDER — SULFAMETHOXAZOLE-TRIMETHOPRIM 800-160 MG PO TABS: 1.0000 | ORAL_TABLET | Freq: Two times a day (BID) | ORAL | 0 refills | Status: DC

## 2019-01-15 MED ORDER — PROMETHAZINE-DM 6.25-15 MG/5ML PO SYRP: ORAL_SOLUTION | ORAL | 0 refills | Status: DC

## 2019-01-15 MED ORDER — PREDNISONE 5 MG PO TABS: 5.0000 mg | ORAL_TABLET | Freq: Two times a day (BID) | ORAL | 0 refills | Status: AC

## 2019-01-15 MED ORDER — BENZONATATE 100 MG PO CAPS: 100.0000 mg | ORAL_CAPSULE | Freq: Two times a day (BID) | ORAL | 0 refills | Status: DC | PRN

## 2019-01-15 NOTE — Progress Notes (Signed)
Subjective:   Carlos Leon is a 73 y.o. male who presents for Medicare Annual/Subsequent preventive examination.  Review of Systems:   Cardiac Risk Factors include: advanced age (>20men, >55 women);smoking/ tobacco exposure;hypertension;dyslipidemia;male gender;obesity (BMI >30kg/m2)     Objective:    Vitals: BP 130/87   Pulse 75   Temp 97.9 F (36.6 C)   Resp 12   Ht 6\' 2"  (1.88 m)   Wt 277 lb (125.6 kg)   SpO2 98%   BMI 35.56 kg/m   Body mass index is 35.56 kg/m.  Advanced Directives 01/15/2019 01/02/2018 08/20/2014 03/18/2014 11/24/2011  Does Patient Have a Medical Advance Directive? No No No Patient does not have advance directive;Patient would like information Patient does not have advance directive;Patient would like information  Would patient like information on creating a medical advance directive? Yes (ED - Information included in AVS) Yes (MAU/Ambulatory/Procedural Areas - Information given) Yes - Educational materials given Advance directive packet given Advance directive packet given  Pre-existing out of facility DNR order (yellow form or pink MOST form) - - - - No    Tobacco Social History   Tobacco Use  Smoking Status Former Smoker  . Packs/day: 2.00  . Years: 5.00  . Pack years: 10.00  . Types: Cigarettes  Smokeless Tobacco Never Used     Counseling given: Not Answered   Clinical Intake:  Pre-visit preparation completed: Yes  Pain : No/denies pain Pain Score: 0-No pain     BMI - recorded: 35.56 Nutritional Status: BMI > 30  Obese Nutritional Risks: None Diabetes: No  How often do you need to have someone help you when you read instructions, pamphlets, or other written materials from your doctor or pharmacy?: 1 - Never What is the last grade level you completed in school?: 12 grade   Interpreter Needed?: No  Information entered by :: Carlos Hanly LPN   Past Medical History:  Diagnosis Date  . Arthritis   . Atrial fibrillation ([place])      Present by ECG 01/2012  . Colon polyps   . Diverticulosis of colon   . Essential hypertension, benign   . Gout   . Hyperlipidemia   . Obesity   . Type 2 diabetes mellitus ([place])    Past Surgical History:  Procedure Laterality Date  . Colon polyps removed  2002   Dr. Tamala Julian  . COLONOSCOPY  2004   Dr. Misty Stanley prep, ACBE showed diverticulosis  . COLONOSCOPY  11/24/2011   Procedure: COLONOSCOPY;  Surgeon: Daneil Dolin, MD;  Location: [facility];  Service: Endoscopy;  Laterality: N/A;  9:10  . HERNIA REPAIR     Family History  Problem Relation Age of Onset  . Hypertension Mother   . Cancer Mother        Rectal, approximately 63  . Diabetes Father   . Liver disease Neg Hx   . Colon cancer Neg Hx    Social History   Socioeconomic History  . Marital status: Married    Spouse name: Not on file  . Number of children: 4  . Years of education: 68  . Highest education level: 12th grade  Occupational History  . Occupation: Retired  Scientific laboratory technician  . Financial resource strain: Not hard at all  . Food insecurity:    Worry: Never true    Inability: Never true  . Transportation needs:    Medical: No    Non-medical: No  Tobacco Use  . Smoking status: Former Smoker  Packs/day: 2.00    Years: 5.00    Pack years: 10.00    Types: Cigarettes  . Smokeless tobacco: Never Used  Substance and Sexual Activity  . Alcohol use: No  . Drug use: No  . Sexual activity: Yes  Lifestyle  . Physical activity:    Days per week: 4 days    Minutes per session: 30 min  . Stress: Not at all  Relationships  . Social connections:    Talks on phone: Twice a week    Gets together: Once a week    Attends religious service: More than 4 times per year    Active member of club or organization: Yes    Attends meetings of clubs or organizations: 1 to 4 times per year    Relationship status: Married  Other Topics Concern  . Not on file  Social History Narrative   Lives with wife alone      Outpatient Encounter Medications as of 01/15/2019  Medication Sig  . allopurinol (ZYLOPRIM) 300 MG tablet TAKE 1 TABLET BY MOUTH ONCE DAILY  . amLODipine (NORVASC) 10 MG tablet TAKE 1 TABLET EVERY DAY  . aspirin EC 81 MG tablet Take 1 tablet (81 mg total) by mouth daily.  . benazepril (LOTENSIN) 20 MG tablet TAKE 2 TABLETS ONE TIME DAILY  . tamsulosin (FLOMAX) 0.4 MG CAPS capsule Use one tablet once daily at supper  for urinary stream   No facility-administered encounter medications on file as of 01/15/2019.     Activities of Daily Living In your present state of health, do you have any difficulty performing the following activities: 01/15/2019  Hearing? Y  Vision? N  Difficulty concentrating or making decisions? N  Walking or climbing stairs? N  Dressing or bathing? N  Doing errands, shopping? N  Preparing Food and eating ? N  Using the Toilet? N  In the past six months, have you accidently leaked urine? N  Do you have problems with loss of bowel control? N  Managing your Medications? N  Managing your Finances? N  Housekeeping or managing your Housekeeping? N  Some recent data might be hidden    Patient Care Team: [name], MD as PCP - General   Assessment:   This is a routine wellness examination for Carlos Leon.  Exercise Activities and Dietary recommendations Current Exercise Habits: Home exercise routine, Type of exercise: walking, Time (Minutes): 30, Frequency (Times/Week): 4, Weekly Exercise (Minutes/Week): 120, Intensity: Mild, Exercise limited by: cardiac condition(s)  Goals    . DIET - EAT MORE FRUITS AND VEGETABLES    . Weight (lb) < 200 lb (90.7 kg)       Fall Risk Fall Risk  01/15/2019 11/16/2018 09/27/2018 01/02/2018 04/21/2017  Falls in the past year? 1 1 No Yes No  Number falls in past yr: 0 0 - 1 -  Injury with Fall? 0 1 - Yes -  Risk for fall due to : Other (Comment) Other (Comment) - - -  Risk for fall due to: Comment - slipped on ice - - -   Follow up Falls prevention discussed - - - -   Is the patient's home free of loose throw rugs in walkways, pet beds, electrical cords, etc?   no      Grab bars in the bathroom? yes      Handrails on the stairs?   yes      Adequate lighting?   yes  Timed Get Up and Go Performed: Patient  able to perform in 6 seconds without assistance   Depression Screen PHQ 2/9 Scores 01/15/2019 11/16/2018 09/27/2018 01/02/2018  PHQ - 2 Score 0 0 0 0  PHQ- 9 Score 0 0 - -    Cognitive Function     6CIT Screen 01/15/2019 01/02/2018  What Year? 0 points 0 points  What month? 0 points 0 points  What time? 0 points 0 points  Count back from 20 0 points 0 points  Months in reverse 0 points 0 points  Repeat phrase 0 points 2 points  Total Score 0 2    Immunization History  Administered Date(s) Administered  . Pneumococcal Conjugate-13 08/20/2014  . Pneumococcal Polysaccharide-23 02/07/2012  . Tdap 02/07/2012    Qualifies for Shingles Vaccine? N/A   Screening Tests Health Maintenance  Topic Date Due  . INFLUENZA VACCINE  11/07/2019 (Originally 07/27/2018)  . HEMOGLOBIN A1C  04/04/2019  . COLONOSCOPY  11/23/2021  . TETANUS/TDAP  02/06/2022  . Hepatitis C Screening  Completed  . PNA vac Low Risk Adult  Completed   Cancer Screenings: Lung: Low Dose CT Chest recommended if Age 1-80 years, 30 pack-year currently smoking OR have quit w/in 15years. Patient does not qualify. Colorectal: will schedule   Additional Screenings:  Hepatitis C Screening:complete       Plan:   Continue to eat healthier, exercise, and lose some weight   I have personally reviewed and noted the following in the patient's chart:   . Medical and social history . Use of alcohol, tobacco or illicit drugs  . Current medications and supplements . Functional ability and status . Nutritional status . Physical activity . Advanced directives . List of other physicians . Hospitalizations, surgeries, and ER visits in  previous 12 months . Vitals . Screenings to include cognitive, depression, and falls . Referrals and appointments  In addition, I have reviewed and discussed with patient certain preventive protocols, quality metrics, and best practice recommendations. A written personalized care plan for preventive services as well as general preventive health recommendations were provided to patient.     Hayden Pedro, LPN  [id number]

## 2019-01-15 NOTE — Patient Instructions (Addendum)
F/U in May as before, call if you need me sooner  CXR at hospital today please  You are treated for acute bronchitis and uncontrolled allergies  5 medications are prescribed, take as directed please  Please get fasting labs end April/ 1 week before follow up, wewill reprint lab order  Thanks for choosing [facility], we consider it a privelige to serve you.

## 2019-01-15 NOTE — Progress Notes (Signed)
   Carlos Leon     MRN: [id number]      DOB: 01-09-1946   HPI Carlos Leon  1 week h/o worsening head and chest congestion, associated with fever and chills intermittently. Nasal drainage has thickened , and is yellowish green, and at times bloody. Sputum is thick and yellow. C/o bilateral ear pressure, denies hearing loss and sore throat. Increasing fatigue , poor appetitie and sleep disturbed by cough. No improvement with OTC medication.   ROS Denies chest pains, palpitations and leg swelling Denies abdominal pain, nausea, vomiting,diarrhea or constipation.   Denies dysuria, frequency, hesitancy or incontinence. Denies joint pain, swelling and limitation in mobility. Denies headaches, seizures, numbness, or tingling. Denies depression, anxiety or insomnia. Denies skin break down or rash.   PE  BP 130/72   Pulse 95   Resp 14   Ht 6\' 2"  (1.88 m)   Wt 278 lb 0.6 oz (126.1 kg)   SpO2 97% Comment: room air  BMI 35.70 kg/m   Patient alert and ill appearing  HEENT: No facial asymmetry, EOMI,   oropharynx pink and moist.  Neck supple no JVD,  mass.maxillary sinus tender , erythema and edema of nasal mucosa,anterior cervical adenitis bilaterally  Chest: decreased air entry though adequate scattered crackles and few wheezes.  CVS: S1, S2 no murmurs, no S3.irRegular rate.  ABD: Soft non tender.   Ext: No edema  MS: Adequate ROM spine, shoulders, hips and knees.  Skin: Intact, no ulcerations or rash noted.  Psych: Good eye contact, normal affect. Memory intact not anxious or depressed appearing.  CNS: CN 2-12 intact, power,  normal throughout.no focal deficits noted.   Assessment & Plan  Acute bronchitis Antibiotic , decongestant and cough suppressant prescribed  Essential hypertension, benign Controlled, no change in medication DASH diet and commitment to daily physical activity for a minimum of 30 minutes discussed and encouraged, as a part of hypertension  management. The importance of attaining a healthy weight is also discussed.  BP/Weight 01/15/2019 01/15/2019 11/16/2018 09/27/2018 01/02/2018 11/22/2017 71/01/4579  Systolic BP 998 338 250 539 [phone]  Diastolic BP 72 87 70 80 82 74 86  Wt. (Lbs) 278.04 277 280.04 277.12 285.25 274 285.25  BMI 35.7 35.56 36.95 35.58 36.62 35.18 36.62       Morbid obesity Obesity linked with hypertension and hyperlipemia and gout Deteriorated. Patient re-educated about  the importance of commitment to a  minimum of 150 minutes of exercise per week.  The importance of healthy food choices with portion control discussed. Encouraged to start a food diary, count calories and to consider  joining a support group. Sample diet sheets offered. Goals set by the patient for the next several months.   Weight /BMI 01/15/2019 01/15/2019 11/16/2018  WEIGHT 278 lb 0.6 oz 277 lb 280 lb 0.6 oz  HEIGHT 6\' 2"  6\' 2"  6\' 1"   BMI 35.7 kg/m2 35.56 kg/m2 36.95 kg/m2      Atrial fibrillation Rate controlled a fib persists, pt has repeatedly refused  anti coagulation, has had Cardiology eval and recommend this in the past  Maxillary sinusitis Antibiotic and saline nasal flushes recommended

## 2019-01-15 NOTE — Patient Instructions (Signed)
[name] , Thank you for taking time to come for your Medicare Wellness Visit. I appreciate your ongoing commitment to your health goals. Please review the following plan we discussed and let me know if I can assist you in the future.   Screening recommendations/referrals: Colonoscopy: had to reschedule due to sickness  Recommended yearly ophthalmology/optometry visit for glaucoma screening and checkup Recommended yearly dental visit for hygiene and checkup  Vaccinations: Influenza vaccine: patient refused  Pneumococcal vaccine: up to date  Tdap vaccine: up to date  Shingles vaccine: up to date    Advanced directives: information provided   Conditions/risks identified: hypertension, advance age   Next appointment: Wellness in one year   Preventive Care 48 Years and Older, Male Preventive care refers to lifestyle choices and visits with your health care provider that can promote health and wellness. What does preventive care include?  A yearly physical exam. This is also called an annual well check.  Dental exams once or twice a year.  Routine eye exams. Ask your health care provider how often you should have your eyes checked.  Personal lifestyle choices, including:  Daily care of your teeth and gums.  Regular physical activity.  Eating a healthy diet.  Avoiding tobacco and drug use.  Limiting alcohol use.  Practicing safe sex.  Taking low doses of aspirin every day.  Taking vitamin and mineral supplements as recommended by your health care provider. What happens during an annual well check? The services and screenings done by your health care provider during your annual well check will depend on your age, overall health, lifestyle risk factors, and family history of disease. Counseling  Your health care provider may ask you questions about your:  Alcohol use.  Tobacco use.  Drug use.  Emotional well-being.  Home and relationship well-being.  Sexual  activity.  Eating habits.  History of falls.  Memory and ability to understand (cognition).  Work and work Statistician. Screening  You may have the following tests or measurements:  Height, weight, and BMI.  Blood pressure.  Lipid and cholesterol levels. These may be checked every 5 years, or more frequently if you are over 65 years old.  Skin check.  Lung cancer screening. You may have this screening every year starting at age 110 if you have a 30-pack-year history of smoking and currently smoke or have quit within the past 15 years.  Fecal occult blood test (FOBT) of the stool. You may have this test every year starting at age 58.  Flexible sigmoidoscopy or colonoscopy. You may have a sigmoidoscopy every 5 years or a colonoscopy every 10 years starting at age 85.  Prostate cancer screening. Recommendations will vary depending on your family history and other risks.  Hepatitis C blood test.  Hepatitis B blood test.  Sexually transmitted disease (STD) testing.  Diabetes screening. This is done by checking your blood sugar (glucose) after you have not eaten for a while (fasting). You may have this done every 1-3 years.  Abdominal aortic aneurysm (AAA) screening. You may need this if you are a current or former smoker.  Osteoporosis. You may be screened starting at age 95 if you are at high risk. Talk with your health care provider about your test results, treatment options, and if necessary, the need for more tests. Vaccines  Your health care provider may recommend certain vaccines, such as:  Influenza vaccine. This is recommended every year.  Tetanus, diphtheria, and acellular pertussis (Tdap, Td) vaccine. You may  need a Td booster every 10 years.  Zoster vaccine. You may need this after age 70.  Pneumococcal 13-valent conjugate (PCV13) vaccine. One dose is recommended after age 60.  Pneumococcal polysaccharide (PPSV23) vaccine. One dose is recommended after age  56. Talk to your health care provider about which screenings and vaccines you need and how often you need them. This information is not intended to replace advice given to you by your health care provider. Make sure you discuss any questions you have with your health care provider. Document Released: 01/09/2016 Document Revised: 09/01/2016 Document Reviewed: 10/14/2015 Elsevier Interactive Patient Education  2017 [place] Prevention in the Home Falls can cause injuries. They can happen to people of all ages. There are many things you can do to make your home safe and to help prevent falls. What can I do on the outside of my home?  Regularly fix the edges of walkways and driveways and fix any cracks.  Remove anything that might make you trip as you walk through a door, such as a raised step or threshold.  Trim any bushes or trees on the path to your home.  Use bright outdoor lighting.  Clear any walking paths of anything that might make someone trip, such as rocks or tools.  Regularly check to see if handrails are loose or broken. Make sure that both sides of any steps have handrails.  Any raised decks and porches should have guardrails on the edges.  Have any leaves, snow, or ice cleared regularly.  Use sand or salt on walking paths during winter.  Clean up any spills in your garage right away. This includes oil or grease spills. What can I do in the bathroom?  Use night lights.  Install grab bars by the toilet and in the tub and shower. Do not use towel bars as grab bars.  Use non-skid mats or decals in the tub or shower.  If you need to sit down in the shower, use a plastic, non-slip stool.  Keep the floor dry. Clean up any water that spills on the floor as soon as it happens.  Remove soap buildup in the tub or shower regularly.  Attach bath mats securely with double-sided non-slip rug tape.  Do not have throw rugs and other things on the floor that can make  you trip. What can I do in the bedroom?  Use night lights.  Make sure that you have a light by your bed that is easy to reach.  Do not use any sheets or blankets that are too big for your bed. They should not hang down onto the floor.  Have a firm chair that has side arms. You can use this for support while you get dressed.  Do not have throw rugs and other things on the floor that can make you trip. What can I do in the kitchen?  Clean up any spills right away.  Avoid walking on wet floors.  Keep items that you use a lot in easy-to-reach places.  If you need to reach something above you, use a strong step stool that has a grab bar.  Keep electrical cords out of the way.  Do not use floor polish or wax that makes floors slippery. If you must use wax, use non-skid floor wax.  Do not have throw rugs and other things on the floor that can make you trip. What can I do with my stairs?  Do not leave any items on  the stairs.  Make sure that there are handrails on both sides of the stairs and use them. Fix handrails that are broken or loose. Make sure that handrails are as long as the stairways.  Check any carpeting to make sure that it is firmly attached to the stairs. Fix any carpet that is loose or worn.  Avoid having throw rugs at the top or bottom of the stairs. If you do have throw rugs, attach them to the floor with carpet tape.  Make sure that you have a light switch at the top of the stairs and the bottom of the stairs. If you do not have them, ask someone to add them for you. What else can I do to help prevent falls?  Wear shoes that:  Do not have high heels.  Have rubber bottoms.  Are comfortable and fit you well.  Are closed at the toe. Do not wear sandals.  If you use a stepladder:  Make sure that it is fully opened. Do not climb a closed stepladder.  Make sure that both sides of the stepladder are locked into place.  Ask someone to hold it for you, if  possible.  Clearly mark and make sure that you can see:  Any grab bars or handrails.  First and last steps.  Where the edge of each step is.  Use tools that help you move around (mobility aids) if they are needed. These include:  Canes.  Walkers.  Scooters.  Crutches.  Turn on the lights when you go into a dark area. Replace any light bulbs as soon as they burn out.  Set up your furniture so you have a clear path. Avoid moving your furniture around.  If any of your floors are uneven, fix them.  If there are any pets around you, be aware of where they are.  Review your medicines with your doctor. Some medicines can make you feel dizzy. This can increase your chance of falling. Ask your doctor what other things that you can do to help prevent falls. This information is not intended to replace advice given to you by your health care provider. Make sure you discuss any questions you have with your health care provider. Document Released: 10/09/2009 Document Revised: 05/20/2016 Document Reviewed: 01/17/2015 Elsevier Interactive Patient Education  2017 [employer].

## 2019-01-16 ENCOUNTER — Ambulatory Visit: Payer: Medicare HMO | Admitting: Family Medicine

## 2019-01-21 ENCOUNTER — Encounter: Payer: Self-pay | Admitting: Family Medicine

## 2019-01-21 DIAGNOSIS — J320 Chronic maxillary sinusitis: Secondary | ICD-10-CM | POA: Insufficient documentation

## 2019-01-21 NOTE — Assessment & Plan Note (Signed)
Obesity linked with hypertension and hyperlipemia and gout Deteriorated. Patient re-educated about  the importance of commitment to a  minimum of 150 minutes of exercise per week.  The importance of healthy food choices with portion control discussed. Encouraged to start a food diary, count calories and to consider  joining a support group. Sample diet sheets offered. Goals set by the patient for the next several months.   Weight /BMI 01/15/2019 01/15/2019 11/16/2018  WEIGHT 278 lb 0.6 oz 277 lb 280 lb 0.6 oz  HEIGHT 6\' 2"  6\' 2"  6\' 1"   BMI 35.7 kg/m2 35.56 kg/m2 36.95 kg/m2

## 2019-01-21 NOTE — Assessment & Plan Note (Signed)
Antibiotic and saline nasal flushes recommended

## 2019-01-21 NOTE — Assessment & Plan Note (Signed)
Rate controlled a fib persists, pt has repeatedly refused  anti coagulation, has had Cardiology eval and recommend this in the past

## 2019-01-21 NOTE — Assessment & Plan Note (Signed)
Antibiotic , decongestant and cough suppressant prescribed

## 2019-01-21 NOTE — Assessment & Plan Note (Signed)
Controlled, no change in medication DASH diet and commitment to daily physical activity for a minimum of 30 minutes discussed and encouraged, as a part of hypertension management. The importance of attaining a healthy weight is also discussed.  BP/Weight 01/15/2019 01/15/2019 11/16/2018 09/27/2018 01/02/2018 11/22/2017 45/12/4602  Systolic BP 799 872 158 727 [phone]  Diastolic BP 72 87 70 80 82 74 86  Wt. (Lbs) 278.04 277 280.04 277.12 285.25 274 285.25  BMI 35.7 35.56 36.95 35.58 36.62 35.18 36.62

## 2019-02-05 ENCOUNTER — Ambulatory Visit: Payer: Medicare HMO

## 2019-02-16 ENCOUNTER — Ambulatory Visit: Payer: Medicare HMO | Admitting: Internal Medicine

## 2019-03-27 ENCOUNTER — Ambulatory Visit: Payer: Medicare HMO

## 2019-05-24 ENCOUNTER — Other Ambulatory Visit: Payer: Self-pay

## 2019-05-24 ENCOUNTER — Ambulatory Visit (INDEPENDENT_AMBULATORY_CARE_PROVIDER_SITE_OTHER): Payer: Medicare HMO | Admitting: Family Medicine

## 2019-05-24 ENCOUNTER — Encounter: Payer: Self-pay | Admitting: Family Medicine

## 2019-05-24 VITALS — BP 131/76 | Ht 74.0 in | Wt 273.0 lb

## 2019-05-24 DIAGNOSIS — E785 Hyperlipidemia, unspecified: Secondary | ICD-10-CM

## 2019-05-24 DIAGNOSIS — R7301 Impaired fasting glucose: Secondary | ICD-10-CM | POA: Diagnosis not present

## 2019-05-24 DIAGNOSIS — I4891 Unspecified atrial fibrillation: Secondary | ICD-10-CM

## 2019-05-24 DIAGNOSIS — Z7189 Other specified counseling: Secondary | ICD-10-CM

## 2019-05-24 DIAGNOSIS — E8881 Metabolic syndrome: Secondary | ICD-10-CM

## 2019-05-24 DIAGNOSIS — R7303 Prediabetes: Secondary | ICD-10-CM | POA: Diagnosis not present

## 2019-05-24 DIAGNOSIS — R3912 Poor urinary stream: Secondary | ICD-10-CM

## 2019-05-24 DIAGNOSIS — R972 Elevated prostate specific antigen [PSA]: Secondary | ICD-10-CM

## 2019-05-24 DIAGNOSIS — I10 Essential (primary) hypertension: Secondary | ICD-10-CM

## 2019-05-24 DIAGNOSIS — E790 Hyperuricemia without signs of inflammatory arthritis and tophaceous disease: Secondary | ICD-10-CM

## 2019-05-24 DIAGNOSIS — D509 Iron deficiency anemia, unspecified: Secondary | ICD-10-CM

## 2019-05-24 DIAGNOSIS — M109 Gout, unspecified: Secondary | ICD-10-CM

## 2019-05-24 MED ORDER — TAMSULOSIN HCL 0.4 MG PO CAPS: 0.4000 mg | ORAL_CAPSULE | Freq: Every day | ORAL | 1 refills | Status: DC

## 2019-05-24 MED ORDER — ALLOPURINOL 300 MG PO TABS: 300.0000 mg | ORAL_TABLET | Freq: Every day | ORAL | 1 refills | Status: DC

## 2019-05-24 NOTE — Patient Instructions (Addendum)
Annual physical exam with MD Nov 22 or after, call if you need me sooner  Wellness due Jan 22 or after  Please get the labs fasting either tomorrow or early next week Randell Loop, nurse pls re send labs ordered 10/2018)   Keep up good health habits with healthy food choice and exercise commitment  Social distancing.LImit exposure to people you do not live with and maintain at least 6 ft disdtance Frequent hand washing with soap and water Keeping your hands off of your face.Wear a face mask These 3 practices will help to keep both you and your community healthy during this time. Please practice them faithfully!  Thanks for choosing [facility], we consider it a privelige to serve you.

## 2019-05-24 NOTE — Progress Notes (Signed)
Virtual Visit via Telephone Note  I connected with Carlos Leon on 05/24/19 at  1:40 PM EDT by telephone and verified that I am speaking with the correct person using two identifiers.  Location: Patient:home Provider:office   I discussed the limitations, risks, security and privacy concerns of performing an evaluation and management service by telephone and the availability of in person appointments. I also discussed with the patient that there may be a patient responsible charge related to this service. The patient expressed understanding and agreed to proceed. This visit type is conducted due to national recommendations for restrictions regarding the COVID -19 Pandemic. Due to the patient's age and / or co morbidities, this format is felt to be most appropriate at this time without adequate follow up. The patient has no access to video technology/ had technical difficulties with video, requiring transitioning to audio format  only ( telephone ). All issues noted this document were discussed and addressed,no physical exam can be performed in this format.    History of Present Illness: Currently goes to bathroom approx  two times at night, before resuming flomax was on avg 4 times, stream is better and less straining Denies recent fever or chills. Denies sinus pressure, nasal congestion, ear pain or sore throat. Denies chest congestion, productive cough or wheezing. Denies chest pains, palpitations and leg swelling Denies abdominal pain, nausea, vomiting,diarrhea or constipation.    Denies uncontrolled  joint pain, swelling and limitation in mobility. Denies headaches, seizures, numbness, or tingling. Denies depression, anxiety or insomnia. Denies skin break down or rash.       Observations/Objective: BP 131/76   Ht 6\' 2"  (1.88 m)   Wt 273 lb (123.8 kg)   BMI 35.05 kg/m  Good communication with no confusion and intact memory. Alert and oriented x 3 No signs of respiratory  distress during sppech    Assessment and Plan: Essential hypertension, benign Controlled, no change in medication DASH diet and commitment to daily physical activity for a minimum of 30 minutes discussed and encouraged, as a part of hypertension management. The importance of attaining a healthy weight is also discussed.  BP/Weight 05/24/2019 01/15/2019 01/15/2019 11/16/2018 09/27/2018 01/02/2018 35/57/3220  Systolic BP 254 270 623 762 831 517 616  Diastolic BP 76 72 87 70 80 82 74  Wt. (Lbs) 273 278.04 277 280.04 277.12 285.25 274  BMI 35.05 35.7 35.56 36.95 35.58 36.62 35.18       Poor urinary stream Improved with flomax , he will take this daily  GOUT, UNSPECIFIED No recent flares since last visit. Non compliant with allopurinol. Educated re the need to take this as he has hyperuricemia with gout. Verbalizes understanding and states he will start taking as prescribed  Atrial fibrillation Asymptomatic, though anti coagulant indicated he continues to refuse to take one  Elevated PSA rept Urology evaluation has been discussed and encouraged repeatedly , opts against this  Morbid obesity Obesity linked with hypertension, gout and arthritis Improved with lifestyle change, he is applauded on thjis and encouraged to continue same  Patient re-educated about  the importance of commitment to a  minimum of 150 minutes of exercise per week as able.  The importance of healthy food choices with portion control discussed, as well as eating regularly and within a 12 hour window most days. The need to choose "clean , green" food 50 to 75% of the time is discussed, as well as to make water the primary drink and set a goal of 64 ounces  water daily.  Encouraged to start a food diary,  and to consider  joining a support group. Sample diet sheets offered. Goals set by the patient for the next several months.   Weight /BMI 05/24/2019 01/15/2019 01/15/2019  WEIGHT 273 lb 278 lb 0.6 oz 277 lb  HEIGHT  6\' 2"  6\' 2"  6\' 2"   BMI 35.05 kg/m2 35.7 kg/m2 35.56 kg/m2      Educated About Covid-19 Virus Infection Covid-19 Education  The signs and symptoms of of COVID -19 were discussed with the patient and how to seek care for testing. ( follow up with PCP or arrange  E-visit) The importance of social  distancing is discussed today.     Follow Up Instructions:    I discussed the assessment and treatment plan with the patient. The patient was provided an opportunity to ask questions and all were answered. The patient agreed with the plan and demonstrated an understanding of the instructions.   The patient was advised to call back or seek an in-person evaluation if the symptoms worsen or if the condition fails to improve as anticipated.  I provided 25  minutes of non-face-to-face time during this encounter.   Tula Nakayama, MD

## 2019-05-25 DIAGNOSIS — Z7189 Other specified counseling: Secondary | ICD-10-CM | POA: Insufficient documentation

## 2019-05-25 HISTORY — DX: Other specified counseling: Z71.89

## 2019-05-25 NOTE — Assessment & Plan Note (Signed)
Asymptomatic, though anti coagulant indicated he continues to refuse to take one

## 2019-05-25 NOTE — Assessment & Plan Note (Signed)
Improved with flomax , he will take this daily

## 2019-05-25 NOTE — Assessment & Plan Note (Signed)
Obesity linked with hypertension, gout and arthritis Improved with lifestyle change, he is applauded on thjis and encouraged to continue same  Patient re-educated about  the importance of commitment to a  minimum of 150 minutes of exercise per week as able.  The importance of healthy food choices with portion control discussed, as well as eating regularly and within a 12 hour window most days. The need to choose "clean , green" food 50 to 75% of the time is discussed, as well as to make water the primary drink and set a goal of 64 ounces water daily.  Encouraged to start a food diary,  and to consider  joining a support group. Sample diet sheets offered. Goals set by the patient for the next several months.   Weight /BMI 05/24/2019 01/15/2019 01/15/2019  WEIGHT 273 lb 278 lb 0.6 oz 277 lb  HEIGHT 6\' 2"  6\' 2"  6\' 2"   BMI 35.05 kg/m2 35.7 kg/m2 35.56 kg/m2

## 2019-05-25 NOTE — Assessment & Plan Note (Signed)
rept Urology evaluation has been discussed and encouraged repeatedly , opts against this

## 2019-05-25 NOTE — Assessment & Plan Note (Signed)
No recent flares since last visit. Non compliant with allopurinol. Educated re the need to take this as he has hyperuricemia with gout. Verbalizes understanding and states he will start taking as prescribed

## 2019-05-25 NOTE — Assessment & Plan Note (Signed)
Covid-19 Education  The signs and symptoms of of COVID -19 were discussed with the patient and how to seek care for testing. ( follow up with PCP or arrange  E-visit) The importance of social  distancing is discussed today.  

## 2019-05-25 NOTE — Assessment & Plan Note (Signed)
Controlled, no change in medication DASH diet and commitment to daily physical activity for a minimum of 30 minutes discussed and encouraged, as a part of hypertension management. The importance of attaining a healthy weight is also discussed.  BP/Weight 05/24/2019 01/15/2019 01/15/2019 11/16/2018 09/27/2018 01/02/2018 84/16/6063  Systolic BP 016 010 932 355 732 202 542  Diastolic BP 76 72 87 70 80 82 74  Wt. (Lbs) 273 278.04 277 280.04 277.12 285.25 274  BMI 35.05 35.7 35.56 36.95 35.58 36.62 35.18

## 2019-05-29 DIAGNOSIS — E785 Hyperlipidemia, unspecified: Secondary | ICD-10-CM | POA: Diagnosis not present

## 2019-05-29 DIAGNOSIS — E790 Hyperuricemia without signs of inflammatory arthritis and tophaceous disease: Secondary | ICD-10-CM | POA: Diagnosis not present

## 2019-05-29 DIAGNOSIS — D509 Iron deficiency anemia, unspecified: Secondary | ICD-10-CM | POA: Diagnosis not present

## 2019-05-29 DIAGNOSIS — I10 Essential (primary) hypertension: Secondary | ICD-10-CM | POA: Diagnosis not present

## 2019-05-29 DIAGNOSIS — R7301 Impaired fasting glucose: Secondary | ICD-10-CM | POA: Diagnosis not present

## 2019-05-30 ENCOUNTER — Encounter: Payer: Self-pay | Admitting: Family Medicine

## 2019-05-30 LAB — LIPID PANEL
Cholesterol: 200 mg/dL — ABNORMAL HIGH (ref <200)
HDL: 45 mg/dL (ref >=40)
LDL Cholesterol (Calc): 134 mg/dL (calc) — ABNORMAL HIGH
Non-HDL Cholesterol (Calc): 155 mg/dL (calc) — ABNORMAL HIGH (ref <130)
Total CHOL/HDL Ratio: 4.4 (calc) (ref <5.0)
Triglycerides: 99 mg/dL (ref <150)

## 2019-05-30 LAB — COMPLETE METABOLIC PANEL WITH GFR
AG Ratio: 1.6 (calc) (ref 1.0–2.5)
ALT: 23 U/L (ref 9–46)
AST: 21 U/L (ref 10–35)
Albumin: 4.1 g/dL (ref 3.6–5.1)
Alkaline phosphatase (APISO): 83 U/L (ref 35–144)
BUN/Creatinine Ratio: 14 (calc) (ref 6–22)
BUN: 18 mg/dL (ref 7–25)
CO2: 25 mmol/L (ref 20–32)
Calcium: 9.5 mg/dL (ref 8.6–10.3)
Chloride: 108 mmol/L (ref 98–110)
Creat: 1.3 mg/dL — ABNORMAL HIGH (ref 0.70–1.18)
GFR, Est African American: 63 mL/min/{1.73_m2} (ref >=60)
GFR, Est Non African American: 55 mL/min/{1.73_m2} — ABNORMAL LOW (ref >=60)
Globulin: 2.5 g/dL (calc) (ref 1.9–3.7)
Glucose, Bld: 124 mg/dL — ABNORMAL HIGH (ref 65–99)
Potassium: 4.6 mmol/L (ref 3.5–5.3)
Sodium: 140 mmol/L (ref 135–146)
Total Bilirubin: 0.5 mg/dL (ref 0.2–1.2)
Total Protein: 6.6 g/dL (ref 6.1–8.1)

## 2019-05-30 LAB — CBC
HCT: 42.7 % (ref 38.5–50.0)
Hemoglobin: 13.5 g/dL (ref 13.2–17.1)
MCH: 25.7 pg — ABNORMAL LOW (ref 27.0–33.0)
MCHC: 31.6 g/dL — ABNORMAL LOW (ref 32.0–36.0)
MCV: 81.3 fL (ref 80.0–100.0)
MPV: 11.1 fL (ref 7.5–12.5)
Platelets: 184 10*3/uL (ref 140–400)
RBC: 5.25 10*6/uL (ref 4.20–5.80)
RDW: 14.9 % (ref 11.0–15.0)
WBC: 6.9 10*3/uL (ref 3.8–10.8)

## 2019-05-30 LAB — IRON: Iron: 56 ug/dL (ref 50–180)

## 2019-05-30 LAB — URIC ACID: Uric Acid, Serum: 4.8 mg/dL (ref 4.0–8.0)

## 2019-05-30 LAB — HEMOGLOBIN A1C
Hgb A1c MFr Bld: 6.1 % of total Hgb — ABNORMAL HIGH (ref <5.7)
Mean Plasma Glucose: 128 (calc)
eAG (mmol/L): 7.1 (calc)

## 2019-09-15 ENCOUNTER — Emergency Department (HOSPITAL_COMMUNITY)
Admission: EM | Admit: 2019-09-15 | Discharge: 2019-09-15 | Disposition: A | Discharge status: Home / Self Care | Payer: Medicare HMO | Attending: Emergency Medicine | Admitting: Emergency Medicine

## 2019-09-15 ENCOUNTER — Other Ambulatory Visit: Payer: Self-pay

## 2019-09-15 ENCOUNTER — Encounter (HOSPITAL_COMMUNITY): Payer: Self-pay | Admitting: Emergency Medicine

## 2019-09-15 DIAGNOSIS — Z7982 Long term (current) use of aspirin: Secondary | ICD-10-CM | POA: Diagnosis not present

## 2019-09-15 DIAGNOSIS — Z79899 Other long term (current) drug therapy: Secondary | ICD-10-CM | POA: Diagnosis not present

## 2019-09-15 DIAGNOSIS — I4891 Unspecified atrial fibrillation: Secondary | ICD-10-CM | POA: Diagnosis not present

## 2019-09-15 DIAGNOSIS — E119 Type 2 diabetes mellitus without complications: Secondary | ICD-10-CM | POA: Diagnosis not present

## 2019-09-15 DIAGNOSIS — I10 Essential (primary) hypertension: Secondary | ICD-10-CM | POA: Insufficient documentation

## 2019-09-15 DIAGNOSIS — Z87891 Personal history of nicotine dependence: Secondary | ICD-10-CM | POA: Insufficient documentation

## 2019-09-15 MED ORDER — AMLODIPINE BESYLATE 10 MG PO TABS: 10.0000 mg | ORAL_TABLET | Freq: Every day | ORAL | 0 refills | Status: DC

## 2019-09-15 NOTE — ED Triage Notes (Signed)
BP 158/105 at home.  Pt has taken bp medications for the day.  No other s/s

## 2019-09-15 NOTE — ED Provider Notes (Signed)
[facility] EMERGENCY DEPARTMENT Provider Note   CSN: [id number] Arrival date & time: 09/15/19  1210     History   Chief Complaint Chief Complaint  Patient presents with  . Hypertension    HPI Carlos Leon is a 73 y.o. male.     HPI     He presents for evaluation of high blood pressure.  He is taking his blood pressure at home and has found it in the range of 160/100.  This is been going on for several days.  No recent changes in blood pressure medications.  He denies headache, blurred vision, nausea, vomiting, weakness or paresthesia.  He has been eating well and walking normally.  He came here by private vehicle for evaluation.  There are no other known modifying factors.  Past Medical History:  Diagnosis Date  . Arthritis   . Atrial fibrillation ([place])    Present by ECG 01/2012  . Colon polyps   . Diverticulosis of colon   . Essential hypertension, benign   . Gout   . Hyperlipidemia   . Obesity   . Type 2 diabetes mellitus [facility])     Patient Active Problem List   Diagnosis Date Noted  . Educated About Covid-19 Virus Infection 05/25/2019  . IDA (iron deficiency anemia) 11/16/2018  . Poor urinary stream 09/30/2018  . Metabolic syndrome X [id number]  . Asymptomatic proteinuria 08/08/2013  . Elevated PSA 11/19/2012  . Atrial fibrillation ([place]) 02/07/2012  . Tubular adenoma of colon 10/27/2011  . 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 Hyperlipemia 08/26/2009  . Morbid obesity ([place]) 04/18/2009  . Prediabetes 10/21/2008  . GOUT, UNSPECIFIED 10/21/2008  . Essential hypertension, benign 10/21/2008    Past Surgical History:  Procedure Laterality Date  . Colon polyps removed  2002   Dr. Tamala Julian  . COLONOSCOPY  2004   Dr. Misty Stanley prep, ACBE showed diverticulosis  . COLONOSCOPY  11/24/2011   Procedure: COLONOSCOPY;  Surgeon: Daneil Dolin, MD;  Location: [facility];  Service: Endoscopy;  Laterality: N/A;  9:10  . HERNIA REPAIR          Home  Medications    Prior to Admission medications   Medication Sig Start Date End Date Taking? Authorizing Provider  allopurinol (ZYLOPRIM) 300 MG tablet Take 1 tablet (300 mg total) by mouth daily. 05/24/19   [name], MD  amLODipine (NORVASC) 10 MG tablet Take 1 tablet (10 mg total) by mouth daily. 09/15/19   Daleen Bo, MD  aspirin EC 81 MG tablet Take 1 tablet (81 mg total) by mouth daily. 03/18/14   [name], MD  benazepril (LOTENSIN) 20 MG tablet TAKE 2 TABLETS ONE TIME DAILY 10/10/18   [name], MD  Misc Natural Products (PROSTATE SUPPORT PO) Take 1 tablet by mouth daily.    [provider]  tamsulosin (FLOMAX) 0.4 MG CAPS capsule Take 1 capsule (0.4 mg total) by mouth daily. 05/24/19   [name], MD    Family History Family History  Problem Relation Age of Onset  . Hypertension Mother   . Cancer Mother        Rectal, approximately 29  . Diabetes Father   . Liver disease Neg Hx   . Colon cancer Neg Hx     Social History Social History   Tobacco Use  . Smoking status: Former Smoker    Packs/day: 2.00    Years: 5.00    Pack years: 10.00    Types: Cigarettes  .  Smokeless tobacco: Never Used  Substance Use Topics  . Alcohol use: No  . Drug use: No     Allergies   Patient has no known allergies.   Review of Systems Review of Systems  All other systems reviewed and are negative.    Physical Exam Updated Vital Signs BP (!) 164/104   Pulse 67   Temp 98.2 F (36.8 C) (Oral)   Resp 16   Ht 6\' 2"  (1.88 m)   Wt 125.6 kg   SpO2 98%   BMI 35.56 kg/m   Physical Exam Vitals signs and nursing note reviewed.  Constitutional:      Appearance: He is well-developed.  HENT:     Head: Normocephalic and atraumatic.     Right Ear: External ear normal.     Left Ear: External ear normal.  Eyes:     Conjunctiva/sclera: Conjunctivae normal.     Pupils: Pupils are equal, round, and reactive to light.  Neck:      Musculoskeletal: Normal range of motion and neck supple.     Trachea: Phonation normal.  Cardiovascular:     Rate and Rhythm: Normal rate.  Pulmonary:     Effort: Pulmonary effort is normal.  Musculoskeletal: Normal range of motion.  Skin:    General: Skin is warm and dry.  Neurological:     Mental Status: He is alert and oriented to person, place, and time.     Cranial Nerves: No cranial nerve deficit.     Sensory: No sensory deficit.     Motor: No abnormal muscle tone.     Coordination: Coordination normal.     Comments: No dysarthria or aphasia  Psychiatric:        Mood and Affect: Mood normal.        Behavior: Behavior normal.        Thought Content: Thought content normal.        Judgment: Judgment normal.      ED Treatments / Results  Labs (all labs ordered are listed, but only abnormal results are displayed) Labs Reviewed - No data to display  EKG None  Radiology No results found.  Procedures Procedures (including critical care time)  Medications Ordered in ED Medications - No data to display   Initial Impression / Assessment and Plan / ED Course  I have reviewed the triage vital signs and the nursing notes.  Pertinent labs & imaging results that were available during my care of the patient were reviewed by me and considered in my medical decision making (see chart for details).         No data found.  At discharge- reevaluation with update and discussion. After initial assessment and treatment, an updated evaluation reveals I discussed patient's medicines with him.  He showed me 3 bottles that he was taking, 2 of which were the identical albuterol pill but in different size containers.  Pointed this out to him he realized that he had been taking 1 of the bottles of allopurinol thinking it was a blood pressure pill.  He then stated he was out of his amlodipine.  At that point he felt comfortable going home and I answered all of his questions.Daleen Bo   Medical Decision Making: Hypertension, with inadvertent medicine taking problem.  Doubt hypertensive urgency or metabolic instability.  No indication for further ED work-up.  CRITICAL CARE- no Performed by: Daleen Bo  Nursing Notes Reviewed/ Care Coordinated Applicable Imaging Reviewed Interpretation of Laboratory Data incorporated  into ED treatment  The patient appears reasonably screened and/or stabilized for discharge and I doubt any other medical condition or other [facility] requiring further screening, evaluation, or treatment in the ED at this time prior to discharge.  Plan: Home Medications-take usual medications; Home Treatments-rest, fluids; return here if the recommended treatment, does not improve the symptoms; Recommended follow up-PCP as needed.   Final Clinical Impressions(s) / ED Diagnoses   Final diagnoses:  Hypertension, unspecified type    ED Discharge Orders         Ordered    amLODipine (NORVASC) 10 MG tablet  Daily     09/15/19 1521           Daleen Bo, MD 09/18/19 1428

## 2019-09-15 NOTE — Discharge Instructions (Addendum)
Take your blood pressure medicine as usual.

## 2019-11-08 ENCOUNTER — Other Ambulatory Visit: Payer: Self-pay | Admitting: Family Medicine

## 2019-11-13 ENCOUNTER — Other Ambulatory Visit: Payer: Self-pay | Admitting: *Deleted

## 2019-11-13 DIAGNOSIS — Z20822 Contact with and (suspected) exposure to covid-19: Secondary | ICD-10-CM

## 2019-11-13 DIAGNOSIS — Z20828 Contact with and (suspected) exposure to other viral communicable diseases: Secondary | ICD-10-CM | POA: Diagnosis not present

## 2019-11-15 LAB — NOVEL CORONAVIRUS, NAA: SARS-CoV-2, NAA: NOT DETECTED

## 2019-11-16 ENCOUNTER — Other Ambulatory Visit: Payer: Self-pay | Admitting: Family Medicine

## 2019-11-16 ENCOUNTER — Telehealth: Payer: Self-pay

## 2019-11-16 NOTE — Telephone Encounter (Signed)
Patient given negative result and verbalized understanding  

## 2019-11-19 ENCOUNTER — Telehealth: Payer: Self-pay | Admitting: *Deleted

## 2019-11-19 ENCOUNTER — Ambulatory Visit (INDEPENDENT_AMBULATORY_CARE_PROVIDER_SITE_OTHER): Payer: Medicare HMO | Admitting: Family Medicine

## 2019-11-19 ENCOUNTER — Other Ambulatory Visit: Payer: Self-pay

## 2019-11-19 ENCOUNTER — Encounter: Payer: Self-pay | Admitting: Family Medicine

## 2019-11-19 VITALS — BP 134/82 | HR 77 | Temp 98.7°F | Ht 74.0 in | Wt 288.0 lb

## 2019-11-19 DIAGNOSIS — Z125 Encounter for screening for malignant neoplasm of prostate: Secondary | ICD-10-CM | POA: Diagnosis not present

## 2019-11-19 DIAGNOSIS — E785 Hyperlipidemia, unspecified: Secondary | ICD-10-CM | POA: Diagnosis not present

## 2019-11-19 DIAGNOSIS — R7303 Prediabetes: Secondary | ICD-10-CM

## 2019-11-19 DIAGNOSIS — I10 Essential (primary) hypertension: Secondary | ICD-10-CM | POA: Diagnosis not present

## 2019-11-19 DIAGNOSIS — R7301 Impaired fasting glucose: Secondary | ICD-10-CM

## 2019-11-19 DIAGNOSIS — Z1211 Encounter for screening for malignant neoplasm of colon: Secondary | ICD-10-CM

## 2019-11-19 DIAGNOSIS — Z0000 Encounter for general adult medical examination without abnormal findings: Secondary | ICD-10-CM

## 2019-11-19 DIAGNOSIS — E559 Vitamin D deficiency, unspecified: Secondary | ICD-10-CM | POA: Diagnosis not present

## 2019-11-19 DIAGNOSIS — H9190 Unspecified hearing loss, unspecified ear: Secondary | ICD-10-CM

## 2019-11-19 MED ORDER — TAMSULOSIN HCL 0.4 MG PO CAPS: 0.4000 mg | ORAL_CAPSULE | Freq: Every day | ORAL | 3 refills | Status: DC

## 2019-11-19 NOTE — Telephone Encounter (Signed)
Pt called and said dr simpson called him in some medication today and [facility] needs someone to call them with clarification on the prescription per the patient

## 2019-11-19 NOTE — Patient Instructions (Addendum)
F/U in office with MD in 5 months, call if you need me sooner.  Labs today , HBA1C, lipid panel CMP and EGFR, PSA, uric,TSH and vitamin D.  You are referred to ENT for evaluation of hearing.  You are referred to [facility] for colonoscopy which is overdue.  New medication to help with urine flow is Flomax 1 at bedtime.  Also remember to drink most of your water before 8 PM at night.  Please work on smaller portion size and change in food should food choices to facilitate weight loss.  You have gained 10 pounds this year.  Please commit to daily exercise for 30 minutes at least 5 days/week.  Thanks for choosing [facility], we consider it a privelige to serve you.

## 2019-11-19 NOTE — Progress Notes (Signed)
   Carlos Leon     MRN: [id number]      DOB: 02/24/1946   HPI: Patient is in for annual physical exam. Immunization is reviewed , and  updated if needed. C/o worsening hearing loss, needs eva;uation by eNT    PE;  BP 134/82   Pulse 77   Temp 98.7 F (37.1 C) (Temporal)   Ht 6\' 2"  (1.88 m)   Wt 288 lb (130.6 kg)   SpO2 98%   BMI 36.98 kg/m   Pleasant male, alert and oriented x 3, in no cardio-pulmonary distress. Afebrile. HEENT No facial trauma or asymetry. Sinuses non tender. EOMI External ears normal,  Neck: supple, no adenopathy,JVD or thyromegaly.No bruits.  Chest: Clear to ascultation bilaterally.No crackles or wheezes. Non tender to palpation  Cardiovascular system; Heart sounds normal,  S1 and  S2 ,no S3.  No murmur, or thrill. Apical beat not displaced Peripheral pulses normal.  Abdomen: Soft, non tender, no organomegaly or masses. No bruits. Bowel sounds normal. No guarding, tenderness or rebound.    Musculoskeletal exam: Full ROM of spine, hips , shoulders and knees. No deformity ,swelling or crepitus noted. No muscle wasting or atrophy.   Neurologic: Cranial nerves 2 to 12 intact. Power, tone ,sensation and reflexes normal throughout. No disturbance in gait. No tremor.  Skin: Intact, no ulceration, erythema , scaling or rash noted. Pigmentation normal throughout  Psych; Normal mood and affect. Judgement and concentration normal   Assessment & Plan:  Annual physical exam Annual exam as documented. Counseling done  re healthy lifestyle involving commitment to 150 minutes exercise per week, heart healthy diet, and attaining healthy weight.The importance of adequate sleep also discussed. Regular seat belt use and home safety, is also discussed. Changes in health habits are decided on by the patient with goals and time frames  set for achieving them. Immunization and cancer screening needs are specifically addressed at this visit.    Hearing loss Progressive, refer to ENT

## 2019-11-20 ENCOUNTER — Encounter: Payer: Self-pay | Admitting: *Deleted

## 2019-11-21 LAB — COMPLETE METABOLIC PANEL WITH GFR
AG Ratio: 1.6 (calc) (ref 1.0–2.5)
ALT: 22 U/L (ref 9–46)
AST: 20 U/L (ref 10–35)
Albumin: 4.1 g/dL (ref 3.6–5.1)
Alkaline phosphatase (APISO): 91 U/L (ref 35–144)
BUN: 16 mg/dL (ref 7–25)
CO2: 26 mmol/L (ref 20–32)
Calcium: 9.4 mg/dL (ref 8.6–10.3)
Chloride: 105 mmol/L (ref 98–110)
Creat: 1.12 mg/dL (ref 0.70–1.18)
GFR, Est African American: 75 mL/min/{1.73_m2} (ref >=60)
GFR, Est Non African American: 65 mL/min/{1.73_m2} (ref >=60)
Globulin: 2.5 g/dL (calc) (ref 1.9–3.7)
Glucose, Bld: 112 mg/dL (ref 65–139)
Potassium: 4.6 mmol/L (ref 3.5–5.3)
Sodium: 138 mmol/L (ref 135–146)
Total Bilirubin: 0.6 mg/dL (ref 0.2–1.2)
Total Protein: 6.6 g/dL (ref 6.1–8.1)

## 2019-11-21 LAB — TEST AUTHORIZATION

## 2019-11-21 LAB — PSA: PSA: 12.8 ng/mL — ABNORMAL HIGH (ref <=4.0)

## 2019-11-21 LAB — LIPID PANEL
Cholesterol: 189 mg/dL (ref <200)
HDL: 45 mg/dL (ref >=40)
LDL Cholesterol (Calc): 124 mg/dL (calc) — ABNORMAL HIGH
Non-HDL Cholesterol (Calc): 144 mg/dL (calc) — ABNORMAL HIGH (ref <130)
Total CHOL/HDL Ratio: 4.2 (calc) (ref <5.0)
Triglycerides: 102 mg/dL (ref <150)

## 2019-11-21 LAB — TSH: TSH: 1.12 mIU/L (ref 0.40–4.50)

## 2019-11-21 LAB — HEMOGLOBIN A1C
Hgb A1c MFr Bld: 6.4 % of total Hgb — ABNORMAL HIGH (ref <5.7)
Mean Plasma Glucose: 137 (calc)
eAG (mmol/L): 7.6 (calc)

## 2019-11-21 LAB — VITAMIN D 25 HYDROXY (VIT D DEFICIENCY, FRACTURES): Vit D, 25-Hydroxy: 16 ng/mL — ABNORMAL LOW (ref 30–100)

## 2019-11-25 ENCOUNTER — Encounter: Payer: Self-pay | Admitting: Family Medicine

## 2019-11-25 DIAGNOSIS — H9190 Unspecified hearing loss, unspecified ear: Secondary | ICD-10-CM | POA: Insufficient documentation

## 2019-11-25 NOTE — Assessment & Plan Note (Signed)

## 2019-11-25 NOTE — Assessment & Plan Note (Signed)
Progressive, refer to ENT

## 2019-11-28 ENCOUNTER — Other Ambulatory Visit: Payer: Self-pay

## 2019-11-28 DIAGNOSIS — Z20822 Contact with and (suspected) exposure to covid-19: Secondary | ICD-10-CM

## 2019-11-28 NOTE — Telephone Encounter (Signed)
The pharmacy will contact the office if clarification is neededI have not received a call or fax that clarification is needed on a certain med from [facility]. Will keep an eye out for this info

## 2019-12-01 LAB — NOVEL CORONAVIRUS, NAA: SARS-CoV-2, NAA: NOT DETECTED

## 2019-12-03 ENCOUNTER — Telehealth: Payer: Self-pay | Admitting: *Deleted

## 2019-12-03 NOTE — Telephone Encounter (Signed)
Pt calling for covid results, negative, verbalizes understanding. 

## 2019-12-05 ENCOUNTER — Telehealth: Payer: Self-pay

## 2019-12-05 NOTE — Telephone Encounter (Signed)
Pt is calling to advise he is having Runny nose with the Flomax, and wants to know if he should continue to take it

## 2019-12-05 NOTE — Telephone Encounter (Signed)
Advised patient of advice with verbal understanding

## 2019-12-05 NOTE — Telephone Encounter (Signed)
Could you please advise?

## 2019-12-25 ENCOUNTER — Other Ambulatory Visit: Payer: Self-pay

## 2019-12-25 ENCOUNTER — Ambulatory Visit (INDEPENDENT_AMBULATORY_CARE_PROVIDER_SITE_OTHER): Payer: Medicare HMO | Admitting: *Deleted

## 2019-12-25 DIAGNOSIS — Z86010 Personal history of colonic polyps: Secondary | ICD-10-CM

## 2019-12-25 MED ORDER — PEG 3350-KCL-NA BICARB-NACL 420 G PO SOLR: 4000.0000 mL | Freq: Once | ORAL | 0 refills | Status: AC

## 2019-12-25 NOTE — Patient Instructions (Signed)
Carlos Leon   1946/07/22 MRN: [id number]    Procedure Date: 03/26/2020 Time to register: 8:30 am Place to register: [facility] Procedure Time: 9:30 am Scheduled provider: Dr. Gala Romney  PREPARATION FOR COLONOSCOPY WITH TRI-LYTE SPLIT PREP  Please notify us immediately if you are diabetic, take iron supplements, or if you are on Coumadin or any other blood thinners.   Please hold the following medications: n/a  You will need to purchase 1 fleet enema and 1 box of Bisacodyl 44m tablets.   2 DAYS BEFORE PROCEDURE:  DATE: 03/24/2020  DAY: Monday Begin clear liquid diet AFTER your lunch meal. NO SOLID FOODS after this point.  1 DAY BEFORE PROCEDURE:  DATE: 03/25/2020   DAY: Tuesday Continue clear liquids the entire day - NO SOLID FOOD.   Diabetic medications adjustments for today: n/a  At 2:00 pm:  Take 2 Bisacodyl tablets.   At 4:00pm:  Start drinking your solution. Make sure you mix well per instructions on the bottle. Try to drink 1 (one) 8 ounce glass every 10-15 minutes until you have consumed HALF the jug. You should complete by 6:00pm.You must keep the left over solution refrigerated until completed next day.  Continue clear liquids. You must drink plenty of clear liquids to prevent dehyration and kidney failure.     DAY OF PROCEDURE:   DATE: 03/26/2020   DAY: Wednesday If you take medications for your heart, blood pressure or breathing, you may take these medications.  Diabetic medications adjustments for today: n/a  Five hours before your procedure time @ 4:30 am:  Finish remaining amout of bowel prep, drinking 1 (one) 8 ounce glass every 10-15 minutes until complete. You have two hours to consume remaining prep.   Three hours before your procedure time @ 6:30 am:  Nothing by mouth.   At least one hour before going to the hospital:  Give yourself one Fleet enema. You may take your morning medications with sip of water unless we have instructed otherwise.       Please see below for Dietary Information.  CLEAR LIQUIDS INCLUDE:  Water Jello (NOT red in color)   Ice Popsicles (NOT red in color)   Tea (sugar ok, no milk/cream) Powdered fruit flavored drinks  Coffee (sugar ok, no milk/cream) Gatorade/ Lemonade/ Kool-Aid  (NOT red in color)   Juice: apple, white grape, white cranberry Soft drinks  Clear bullion, consomme, broth (fat free beef/chicken/vegetable)  Carbonated beverages (any kind)  Strained chicken noodle soup Hard Candy   Remember: Clear liquids are liquids that will allow you to see your fingers on the other side of a clear glass. Be sure liquids are NOT red in color, and not cloudy, but CLEAR.  DO NOT EAT OR DRINK ANY OF THE FOLLOWING:  Dairy products of any kind   Cranberry juice Tomato juice / V8 juice   Grapefruit juice Orange juice     Red grape juice  Do not eat any solid foods, including such foods as: cereal, oatmeal, yogurt, fruits, vegetables, creamed soups, eggs, bread, crackers, pureed foods in a blender, etc.   HELPFUL HINTS FOR DRINKING PREP SOLUTION:   Make sure prep is extremely cold. Mix and refrigerate the the morning of the prep. You may also put in the freezer.   You may try mixing some Crystal Light or Country Time Lemonade if you prefer. Mix in small amounts; add more if necessary.  Try drinking through a straw  Rinse mouth with water or a  mouthwash between glasses, to remove after-taste.  Try sipping on a cold beverage /ice/ popsicles between glasses of prep.  Place a piece of sugar-free hard candy in mouth between glasses.  If you become nauseated, try consuming smaller amounts, or stretch out the time between glasses. Stop for 30-60 minutes, then slowly start back drinking.        OTHER INSTRUCTIONS  You will need a responsible adult at least 73 years of age to accompany you and drive you home. This person must remain in the waiting room during your procedure. The hospital will cancel  your procedure if you do not have a responsible adult with you.   1. Wear loose fitting clothing that is easily removed. 2. Leave jewelry and other valuables at home.  3. Remove all body piercing jewelry and leave at home. 4. Total time from sign-in until discharge is approximately 2-3 hours. 5. You should go home directly after your procedure and rest. You can resume normal activities the day after your procedure. 6. The day of your procedure you should not:  Drive  Make legal decisions  Operate machinery  Drink alcohol  Return to work   You may call the office (Dept: [phone]) before 5:00pm, or page the doctor on call ([phone]) after 5:00pm, for further instructions, if necessary.   Insurance Information YOU WILL NEED TO CHECK WITH YOUR INSURANCE COMPANY FOR THE BENEFITS OF COVERAGE YOU HAVE FOR THIS PROCEDURE.  UNFORTUNATELY, NOT ALL INSURANCE COMPANIES HAVE BENEFITS TO COVER ALL OR PART OF THESE TYPES OF PROCEDURES.  IT IS YOUR RESPONSIBILITY TO CHECK YOUR BENEFITS, HOWEVER, WE WILL BE GLAD TO ASSIST YOU WITH ANY CODES YOUR INSURANCE COMPANY MAY NEED.    PLEASE NOTE THAT MOST INSURANCE COMPANIES WILL NOT COVER A SCREENING COLONOSCOPY FOR PEOPLE UNDER THE AGE OF 50  IF YOU HAVE BCBS INSURANCE, YOU MAY HAVE BENEFITS FOR A SCREENING COLONOSCOPY BUT IF POLYPS ARE FOUND THE DIAGNOSIS WILL CHANGE AND THEN YOU MAY HAVE A DEDUCTIBLE THAT WILL NEED TO BE MET. SO PLEASE MAKE SURE YOU CHECK YOUR BENEFITS FOR A SCREENING COLONOSCOPY AS WELL AS A DIAGNOSTIC COLONOSCOPY.  

## 2019-12-25 NOTE — Progress Notes (Signed)
Ok to schedule.

## 2019-12-25 NOTE — Progress Notes (Addendum)
Gastroenterology Pre-Procedure Review  Request Date: 12/25/2019 Requesting Physician: Dr. Moshe Cipro, Last TCS 2012 done by Dr. Gala Romney, tubular adenoma  PATIENT REVIEW QUESTIONS: The patient responded to the following health history questions as indicated:    1. Diabetes Melitis: no 2. Joint replacements in the past 12 months: no 3. Major health problems in the past 3 months: no 4. Has an artificial valve or MVP: no 5. Has a defibrillator: no 6. Has been advised in past to take antibiotics in advance of a procedure like teeth cleaning: no 7. Family history of colon cancer: no  8. Alcohol Use: no 9. Illicit drug Use: no 10. History of sleep apnea: no  11. History of coronary artery or other vascular stents placed within the last 12 months: no 12. History of any prior anesthesia complications: no 13. There is no height or weight on file to calculate BMI.ht: 6'2 wt: 288 lbs    MEDICATIONS & ALLERGIES:    Patient reports the following regarding taking any blood thinners:   Plavix? no Aspirin? yes Coumadin? no Brilinta? no Xarelto? no Eliquis? no Pradaxa? no Savaysa? no Effient? no  Patient confirms/reports the following medications:  Current Outpatient Medications  Medication Sig Dispense Refill  . allopurinol (ZYLOPRIM) 300 MG tablet Take 1 tablet (300 mg total) by mouth daily. 90 tablet 1  . amLODipine (NORVASC) 10 MG tablet TAKE 1 TABLET EVERY DAY 90 tablet 1  . aspirin EC 81 MG tablet Take 1 tablet (81 mg total) by mouth daily. 31 tablet 11  . benazepril (LOTENSIN) 20 MG tablet TAKE 2 TABLETS ONE TIME DAILY 180 tablet 0  . Cholecalciferol (VITAMIN D-3) 125 MCG (5000 UT) TABS Take by mouth daily.    . Misc Natural Products (PROSTATE SUPPORT PO) Take 1 tablet by mouth daily.    . tamsulosin (FLOMAX) 0.4 MG CAPS capsule Take 1 capsule (0.4 mg total) by mouth daily. 90 capsule 3   No current facility-administered medications for this visit.    Patient confirms/reports the  following allergies:  No Known Allergies  No orders of the defined types were placed in this encounter.   AUTHORIZATION INFORMATION Primary Insurance: [employer]  ID #: [id number] Pre-Cert / Josem Kaufmann required: No, not required per Vinnie Level / Auth #: Darlene 12/26/2019  SCHEDULE INFORMATION: Procedure has been scheduled as follows:  Date: 03/26/2020, Time: 9:30 Location: [facility] with Dr. Gala Romney  This Gastroenterology Pre-Precedure Review Form is being routed to the following provider(s): Neil Crouch, PA

## 2019-12-26 NOTE — Addendum Note (Signed)
Addended by: Metro Kung on: 12/26/2019 11:16 AM   Modules accepted: Orders, SmartSet

## 2019-12-31 ENCOUNTER — Other Ambulatory Visit: Payer: Self-pay | Admitting: Family Medicine

## 2020-01-07 ENCOUNTER — Other Ambulatory Visit: Payer: Self-pay | Admitting: Family Medicine

## 2020-01-18 ENCOUNTER — Ambulatory Visit (INDEPENDENT_AMBULATORY_CARE_PROVIDER_SITE_OTHER): Payer: Medicare HMO | Admitting: Family Medicine

## 2020-01-18 ENCOUNTER — Other Ambulatory Visit: Payer: Self-pay

## 2020-01-18 ENCOUNTER — Encounter: Payer: Self-pay | Admitting: Family Medicine

## 2020-01-18 VITALS — BP 134/82 | HR 77 | Resp 15 | Ht 74.0 in | Wt 288.0 lb

## 2020-01-18 DIAGNOSIS — Z0000 Encounter for general adult medical examination without abnormal findings: Secondary | ICD-10-CM

## 2020-01-18 DIAGNOSIS — E119 Type 2 diabetes mellitus without complications: Secondary | ICD-10-CM | POA: Diagnosis not present

## 2020-01-18 NOTE — Progress Notes (Signed)
Subjective:   Carlos Leon is a 74 y.o. male who presents for Medicare Annual/Subsequent preventive examination.  Location of Patient: Home Location of Provider: Telehealth Consent was obtain for visit to be over via telehealth.  I verified that I am speaking with the correct person using two identifiers.   Review of Systems:    Cardiac Risk Factors include: advanced age (>60men, >48 women);dyslipidemia;hypertension;male gender     Objective:    Vitals: BP 134/82   Pulse 77   Resp 15   Ht 6\' 2"  (1.88 m)   Wt 288 lb (130.6 kg)   BMI 36.98 kg/m   Body mass index is 36.98 kg/m.  Advanced Directives 09/15/2019 01/15/2019 01/02/2018 08/20/2014 03/18/2014 11/24/2011  Does Patient Have a Medical Advance Directive? No No No No Patient does not have advance directive;Patient would like information Patient does not have advance directive;Patient would like information  Would patient like information on creating a medical advance directive? No - Patient declined Yes (ED - Information included in AVS) Yes (MAU/Ambulatory/Procedural Areas - Information given) Yes - Educational materials given Advance directive packet given Advance directive packet given  Pre-existing out of facility DNR order (yellow form or pink MOST form) - - - - - No    Tobacco Social History   Tobacco Use  Smoking Status Former Smoker  . Packs/day: 2.00  . Years: 5.00  . Pack years: 10.00  . Types: Cigarettes  Smokeless Tobacco Never Used     Counseling given: Yes   Clinical Intake:  Pre-visit preparation completed: Yes  Pain : No/denies pain Pain Score: 0-No pain     BMI - recorded: 36.98 Nutritional Status: BMI > 30  Obese Nutritional Risks: None Diabetes: No  How often do you need to have someone help you when you read instructions, pamphlets, or other written materials from your doctor or pharmacy?: 1 - Never What is the last grade level you completed in school?: 12  Interpreter Needed?:  No     Past Medical History:  Diagnosis Date  . Arthritis   . Atrial fibrillation ([place] Molina)    Present by ECG 01/2012  . Colon polyps   . Diverticulosis of colon   . Essential hypertension, benign   . Gout   . Hyperlipidemia   . Obesity   . Type 2 diabetes mellitus ([place])    Past Surgical History:  Procedure Laterality Date  . Colon polyps removed  2002   Dr. Tamala Julian  . COLONOSCOPY  2004   Dr. Misty Stanley prep, ACBE showed diverticulosis  . COLONOSCOPY  11/24/2011   Procedure: COLONOSCOPY;  Surgeon: Daneil Dolin, MD;  Location: [facility];  Service: Endoscopy;  Laterality: N/A;  9:10  . HERNIA REPAIR     Family History  Problem Relation Age of Onset  . Hypertension Mother   . Cancer Mother        Rectal, approximately 68  . Diabetes Father   . Liver disease Neg Hx   . Colon cancer Neg Hx    Social History   Socioeconomic History  . Marital status: Married    Spouse name: Not on file  . Number of children: 4  . Years of education: 33  . Highest education level: 12th grade  Occupational History  . Occupation: Retired  Tobacco Use  . Smoking status: Former Smoker    Packs/day: 2.00    Years: 5.00    Pack years: 10.00    Types: Cigarettes  . Smokeless tobacco: Never  Used  Substance and Sexual Activity  . Alcohol use: No  . Drug use: No  . Sexual activity: Yes  Other Topics Concern  . Not on file  Social History Narrative   Lives with wife alone    Social Determinants of Health   Financial Resource Strain:   . Difficulty of Paying Living Expenses: Not on file  Food Insecurity:   . Worried About Charity fundraiser in the Last Year: Not on file  . Ran Out of Food in the Last Year: Not on file  Transportation Needs:   . Lack of Transportation (Medical): Not on file  . Lack of Transportation (Non-Medical): Not on file  Physical Activity:   . Days of Exercise per Week: Not on file  . Minutes of Exercise per Session: Not on file  Stress:   . Feeling of  Stress : Not on file  Social Connections:   . Frequency of Communication with Friends and Family: Not on file  . Frequency of Social Gatherings with Friends and Family: Not on file  . Attends Religious Services: Not on file  . Active Member of Clubs or Organizations: Not on file  . Attends Archivist Meetings: Not on file  . Marital Status: Not on file    Outpatient Encounter Medications as of 01/18/2020  Medication Sig  . allopurinol (ZYLOPRIM) 300 MG tablet Take 1 tablet by mouth once daily  . amLODipine (NORVASC) 10 MG tablet TAKE 1 TABLET EVERY DAY  . aspirin EC 81 MG tablet Take 1 tablet (81 mg total) by mouth daily.  . benazepril (LOTENSIN) 20 MG tablet TAKE 2 TABLETS ONE TIME DAILY  . Cholecalciferol (VITAMIN D-3) 125 MCG (5000 UT) TABS Take by mouth daily.  . Misc Natural Products (PROSTATE SUPPORT PO) Take 1 tablet by mouth daily.  . tamsulosin (FLOMAX) 0.4 MG CAPS capsule Take 1 capsule (0.4 mg total) by mouth daily.   No facility-administered encounter medications on file as of 01/18/2020.    Activities of Daily Living In your present state of health, do you have any difficulty performing the following activities: 01/18/2020  Hearing? Y  Vision? N  Difficulty concentrating or making decisions? N  Walking or climbing stairs? N  Dressing or bathing? N  Doing errands, shopping? N  Preparing Food and eating ? N  Using the Toilet? N  In the past six months, have you accidently leaked urine? N  Do you have problems with loss of bowel control? N  Managing your Medications? N  Managing your Finances? N  Housekeeping or managing your Housekeeping? N  Some recent data might be hidden    Patient Care Team: [name], MD as PCP - General Rourk, Cristopher Estimable, MD as Consulting Physician (Gastroenterology)   Assessment:   This is a routine wellness examination for Carlos Leon.  Exercise Activities and Dietary recommendations Current Exercise Habits: Home exercise  routine, Type of exercise: walking, Time (Minutes): 20, Frequency (Times/Week): 4, Weekly Exercise (Minutes/Week): 80, Intensity: Mild, Exercise limited by: None identified  Goals    . DIET - EAT MORE FRUITS AND VEGETABLES    . Weight (lb) < 200 lb (90.7 kg)       Fall Risk Fall Risk  01/18/2020 11/19/2019 05/24/2019 01/15/2019 01/15/2019  Falls in the past year? 0 0 0 1 1  Comment - - - he tripped -  Number falls in past yr: 0 0 0 0 0  Injury with Fall? 0 0 0 0  0  Risk for fall due to : - - - - Other (Comment)  Risk for fall due to: Comment - - - - -  Follow up - - - - Falls prevention discussed   Is the patient's home free of loose throw rugs in walkways, pet beds, electrical cords, etc?   yes      Grab bars in the bathroom? yes      Handrails on the stairs?   yes      Adequate lighting?   yes  Depression Screen PHQ 2/9 Scores 01/18/2020 05/24/2019 01/15/2019 01/15/2019  PHQ - 2 Score 0 0 0 0  PHQ- 9 Score - - 0 0    Cognitive Function     6CIT Screen 01/18/2020 01/15/2019 01/02/2018  What Year? 0 points 0 points 0 points  What month? 0 points 0 points 0 points  What time? 0 points 0 points 0 points  Count back from 20 0 points 0 points 0 points  Months in reverse 0 points 0 points 0 points  Repeat phrase 0 points 0 points 2 points  Total Score 0 0 2    Immunization History  Administered Date(s) Administered  . Pneumococcal Conjugate-13 08/20/2014  . Pneumococcal Polysaccharide-23 02/07/2012  . Tdap 02/07/2012    Qualifies for Shingles Vaccine?  declined  Screening Tests Health Maintenance  Topic Date Due  . INFLUENZA VACCINE  03/26/2020 (Originally 07/28/2019)  . HEMOGLOBIN A1C  05/18/2020  . COLONOSCOPY  11/23/2021  . TETANUS/TDAP  02/06/2022  . Hepatitis C Screening  Completed  . PNA vac Low Risk Adult  Completed   Cancer Screenings: Lung: Low Dose CT Chest recommended if Age 41-80 years, 30 pack-year currently smoking OR have quit w/in 15years. Patient does not  qualify. Colorectal:  Due 2022  Additional Screenings:  Hepatitis C Screening: completed      Plan:       1. Encounter for Medicare annual wellness exam   I have personally reviewed and noted the following in the patient's chart:   . Medical and social history . Use of alcohol, tobacco or illicit drugs  . Current medications and supplements . Functional ability and status . Nutritional status . Physical activity . Advanced directives . List of other physicians . Hospitalizations, surgeries, and ER visits in previous 12 months . Vitals . Screenings to include cognitive, depression, and falls . Referrals and appointments  In addition, I have reviewed and discussed with patient certain preventive protocols, quality metrics, and best practice recommendations. A written personalized care plan for preventive services as well as general preventive health recommendations were provided to patient.     I provided 20 minutes of non-face-to-face time during this encounter.   Perlie Mayo, NP  01/18/2020

## 2020-01-18 NOTE — Patient Instructions (Signed)
Carlos Leon , Thank you for taking time to come for your Medicare Wellness Visit. I appreciate your ongoing commitment to your health goals. Please review the following plan we discussed and let me know if I can assist you in the future.   Please continue to practice social distancing to keep you, your family, and our community safe.  If you must go out, please wear a Mask and practice good handwashing.  Screening recommendations/referrals: Colonoscopy: Due 2022 Recommended yearly ophthalmology/optometry visit for glaucoma screening and checkup Recommended yearly dental visit for hygiene and checkup  Vaccinations: Influenza vaccine: Declined Pneumococcal vaccine: Completed Tdap vaccine: Up to date Shingles vaccine: Declined  Advanced directives: Copy to be mailed for review, let us know if you have any questions  Conditions/risks identified: Falls   Next appointment: 04/17/2020   Preventive Care 74 Years and Older, Male Preventive care refers to lifestyle choices and visits with your health care provider that can promote health and wellness. What does preventive care include?  A yearly physical exam. This is also called an annual well check.  Dental exams once or twice a year.  Routine eye exams. Ask your health care provider how often you should have your eyes checked.  Personal lifestyle choices, including:  Daily care of your teeth and gums.  Regular physical activity.  Eating a healthy diet.  Avoiding tobacco and drug use.  Limiting alcohol use.  Practicing safe sex.  Taking low doses of aspirin every day.  Taking vitamin and mineral supplements as recommended by your health care provider. What happens during an annual well check? The services and screenings done by your health care provider during your annual well check will depend on your age, overall health, lifestyle risk factors, and family history of disease. Counseling  Your health care provider may  ask you questions about your:  Alcohol use.  Tobacco use.  Drug use.  Emotional well-being.  Home and relationship well-being.  Sexual activity.  Eating habits.  History of falls.  Memory and ability to understand (cognition).  Work and work Statistician. Screening  You may have the following tests or measurements:  Height, weight, and BMI.  Blood pressure.  Lipid and cholesterol levels. These may be checked every 5 years, or more frequently if you are over 34 years old.  Skin check.  Lung cancer screening. You may have this screening every year starting at age 5 if you have a 30-pack-year history of smoking and currently smoke or have quit within the past 15 years.  Fecal occult blood test (FOBT) of the stool. You may have this test every year starting at age 34.  Flexible sigmoidoscopy or colonoscopy. You may have a sigmoidoscopy every 5 years or a colonoscopy every 10 years starting at age 28.  Prostate cancer screening. Recommendations will vary depending on your family history and other risks.  Hepatitis C blood test.  Hepatitis B blood test.  Sexually transmitted disease (STD) testing.  Diabetes screening. This is done by checking your blood sugar (glucose) after you have not eaten for a while (fasting). You may have this done every 1-3 years.  Abdominal aortic aneurysm (AAA) screening. You may need this if you are a current or former smoker.  Osteoporosis. You may be screened starting at age 19 if you are at high risk. Talk with your health care provider about your test results, treatment options, and if necessary, the need for more tests. Vaccines  Your health care provider may recommend certain vaccines,  such as:  Influenza vaccine. This is recommended every year.  Tetanus, diphtheria, and acellular pertussis (Tdap, Td) vaccine. You may need a Td booster every 10 years.  Zoster vaccine. You may need this after age 37.  Pneumococcal 13-valent  conjugate (PCV13) vaccine. One dose is recommended after age 15.  Pneumococcal polysaccharide (PPSV23) vaccine. One dose is recommended after age 62. Talk to your health care provider about which screenings and vaccines you need and how often you need them. This information is not intended to replace advice given to you by your health care provider. Make sure you discuss any questions you have with your health care provider. Document Released: 01/09/2016 Document Revised: 09/01/2016 Document Reviewed: 10/14/2015 Elsevier Interactive Patient Education  2017 [place] Prevention in the Home Falls can cause injuries. They can happen to people of all ages. There are many things you can do to make your home safe and to help prevent falls. What can I do on the outside of my home?  Regularly fix the edges of walkways and driveways and fix any cracks.  Remove anything that might make you trip as you walk through a door, such as a raised step or threshold.  Trim any bushes or trees on the path to your home.  Use bright outdoor lighting.  Clear any walking paths of anything that might make someone trip, such as rocks or tools.  Regularly check to see if handrails are loose or broken. Make sure that both sides of any steps have handrails.  Any raised decks and porches should have guardrails on the edges.  Have any leaves, snow, or ice cleared regularly.  Use sand or salt on walking paths during winter.  Clean up any spills in your garage right away. This includes oil or grease spills. What can I do in the bathroom?  Use night lights.  Install grab bars by the toilet and in the tub and shower. Do not use towel bars as grab bars.  Use non-skid mats or decals in the tub or shower.  If you need to sit down in the shower, use a plastic, non-slip stool.  Keep the floor dry. Clean up any water that spills on the floor as soon as it happens.  Remove soap buildup in the tub or  shower regularly.  Attach bath mats securely with double-sided non-slip rug tape.  Do not have throw rugs and other things on the floor that can make you trip. What can I do in the bedroom?  Use night lights.  Make sure that you have a light by your bed that is easy to reach.  Do not use any sheets or blankets that are too big for your bed. They should not hang down onto the floor.  Have a firm chair that has side arms. You can use this for support while you get dressed.  Do not have throw rugs and other things on the floor that can make you trip. What can I do in the kitchen?  Clean up any spills right away.  Avoid walking on wet floors.  Keep items that you use a lot in easy-to-reach places.  If you need to reach something above you, use a strong step stool that has a grab bar.  Keep electrical cords out of the way.  Do not use floor polish or wax that makes floors slippery. If you must use wax, use non-skid floor wax.  Do not have throw rugs and other things on  the floor that can make you trip. What can I do with my stairs?  Do not leave any items on the stairs.  Make sure that there are handrails on both sides of the stairs and use them. Fix handrails that are broken or loose. Make sure that handrails are as long as the stairways.  Check any carpeting to make sure that it is firmly attached to the stairs. Fix any carpet that is loose or worn.  Avoid having throw rugs at the top or bottom of the stairs. If you do have throw rugs, attach them to the floor with carpet tape.  Make sure that you have a light switch at the top of the stairs and the bottom of the stairs. If you do not have them, ask someone to add them for you. What else can I do to help prevent falls?  Wear shoes that:  Do not have high heels.  Have rubber bottoms.  Are comfortable and fit you well.  Are closed at the toe. Do not wear sandals.  If you use a stepladder:  Make sure that it is fully  opened. Do not climb a closed stepladder.  Make sure that both sides of the stepladder are locked into place.  Ask someone to hold it for you, if possible.  Clearly mark and make sure that you can see:  Any grab bars or handrails.  First and last steps.  Where the edge of each step is.  Use tools that help you move around (mobility aids) if they are needed. These include:  Canes.  Walkers.  Scooters.  Crutches.  Turn on the lights when you go into a dark area. Replace any light bulbs as soon as they burn out.  Set up your furniture so you have a clear path. Avoid moving your furniture around.  If any of your floors are uneven, fix them.  If there are any pets around you, be aware of where they are.  Review your medicines with your doctor. Some medicines can make you feel dizzy. This can increase your chance of falling. Ask your doctor what other things that you can do to help prevent falls. This information is not intended to replace advice given to you by your health care provider. Make sure you discuss any questions you have with your health care provider. Document Released: 10/09/2009 Document Revised: 05/20/2016 Document Reviewed: 01/17/2015 Elsevier Interactive Patient Education  2017 [employer].

## 2020-02-02 ENCOUNTER — Other Ambulatory Visit: Payer: Self-pay | Admitting: Family Medicine

## 2020-03-14 ENCOUNTER — Other Ambulatory Visit: Payer: Self-pay | Admitting: Family Medicine

## 2020-03-24 ENCOUNTER — Other Ambulatory Visit: Payer: Self-pay

## 2020-03-24 ENCOUNTER — Other Ambulatory Visit (HOSPITAL_COMMUNITY)
Admission: RE | Admit: 2020-03-24 | Discharge: 2020-03-24 | Disposition: A | Discharge status: Home / Self Care | Payer: Medicare HMO | Source: Ambulatory Visit | Attending: Internal Medicine | Admitting: Internal Medicine

## 2020-03-24 DIAGNOSIS — Z20822 Contact with and (suspected) exposure to covid-19: Secondary | ICD-10-CM | POA: Insufficient documentation

## 2020-03-24 DIAGNOSIS — Z01812 Encounter for preprocedural laboratory examination: Secondary | ICD-10-CM | POA: Insufficient documentation

## 2020-03-24 LAB — SARS CORONAVIRUS 2 (TAT 6-24 HRS): SARS Coronavirus 2: NEGATIVE

## 2020-03-26 ENCOUNTER — Encounter (HOSPITAL_COMMUNITY)
Admission: RE | Disposition: A | Discharge status: Home / Self Care | Payer: Self-pay | Source: Ambulatory Visit | Attending: Internal Medicine

## 2020-03-26 ENCOUNTER — Other Ambulatory Visit: Payer: Self-pay

## 2020-03-26 ENCOUNTER — Encounter (HOSPITAL_COMMUNITY): Payer: Self-pay | Admitting: Internal Medicine

## 2020-03-26 ENCOUNTER — Ambulatory Visit (HOSPITAL_COMMUNITY)
Admission: RE | Admit: 2020-03-26 | Discharge: 2020-03-26 | Disposition: A | Discharge status: Home / Self Care | Payer: Medicare HMO | Source: Ambulatory Visit | Attending: Internal Medicine | Admitting: Internal Medicine

## 2020-03-26 DIAGNOSIS — Z7982 Long term (current) use of aspirin: Secondary | ICD-10-CM | POA: Diagnosis not present

## 2020-03-26 DIAGNOSIS — K5730 Diverticulosis of large intestine without perforation or abscess without bleeding: Secondary | ICD-10-CM | POA: Diagnosis not present

## 2020-03-26 DIAGNOSIS — Z86010 Personal history of colonic polyps: Secondary | ICD-10-CM | POA: Diagnosis not present

## 2020-03-26 DIAGNOSIS — Z1211 Encounter for screening for malignant neoplasm of colon: Secondary | ICD-10-CM | POA: Diagnosis not present

## 2020-03-26 DIAGNOSIS — M109 Gout, unspecified: Secondary | ICD-10-CM | POA: Insufficient documentation

## 2020-03-26 DIAGNOSIS — M1990 Unspecified osteoarthritis, unspecified site: Secondary | ICD-10-CM | POA: Diagnosis not present

## 2020-03-26 DIAGNOSIS — E669 Obesity, unspecified: Secondary | ICD-10-CM | POA: Insufficient documentation

## 2020-03-26 DIAGNOSIS — Z87891 Personal history of nicotine dependence: Secondary | ICD-10-CM | POA: Diagnosis not present

## 2020-03-26 DIAGNOSIS — D120 Benign neoplasm of cecum: Secondary | ICD-10-CM | POA: Insufficient documentation

## 2020-03-26 DIAGNOSIS — I4891 Unspecified atrial fibrillation: Secondary | ICD-10-CM | POA: Diagnosis not present

## 2020-03-26 DIAGNOSIS — I10 Essential (primary) hypertension: Secondary | ICD-10-CM | POA: Diagnosis not present

## 2020-03-26 DIAGNOSIS — Z79899 Other long term (current) drug therapy: Secondary | ICD-10-CM | POA: Diagnosis not present

## 2020-03-26 DIAGNOSIS — D124 Benign neoplasm of descending colon: Secondary | ICD-10-CM

## 2020-03-26 DIAGNOSIS — E785 Hyperlipidemia, unspecified: Secondary | ICD-10-CM | POA: Diagnosis not present

## 2020-03-26 DIAGNOSIS — Z6836 Body mass index (BMI) 36.0-36.9, adult: Secondary | ICD-10-CM | POA: Insufficient documentation

## 2020-03-26 HISTORY — PX: COLONOSCOPY: SHX5424

## 2020-03-26 SURGERY — COLONOSCOPY
Anesthesia: Moderate Sedation

## 2020-03-26 MED ORDER — MIDAZOLAM HCL 5 MG/5ML IJ SOLN
INTRAMUSCULAR | Status: AC
  Filled 2020-03-26: qty 10

## 2020-03-26 MED ORDER — ONDANSETRON HCL 4 MG/2ML IJ SOLN
INTRAMUSCULAR | Status: AC
  Filled 2020-03-26: qty 2

## 2020-03-26 MED ORDER — ONDANSETRON HCL 4 MG/2ML IJ SOLN
INTRAMUSCULAR | Status: DC | PRN
  Administered 2020-03-26: 4 mg via INTRAVENOUS

## 2020-03-26 MED ORDER — MEPERIDINE HCL 50 MG/ML IJ SOLN
INTRAMUSCULAR | Status: AC
  Filled 2020-03-26: qty 1

## 2020-03-26 MED ORDER — SODIUM CHLORIDE 0.9 % IV SOLN: INTRAVENOUS | Status: DC

## 2020-03-26 MED ORDER — MIDAZOLAM HCL 5 MG/5ML IJ SOLN
INTRAMUSCULAR | Status: DC | PRN
  Administered 2020-03-26 (×2): 2 mg via INTRAVENOUS
  Administered 2020-03-26 (×2): 1 mg via INTRAVENOUS

## 2020-03-26 MED ORDER — MEPERIDINE HCL 100 MG/ML IJ SOLN
INTRAMUSCULAR | Status: DC | PRN
  Administered 2020-03-26: 15 mg via INTRAVENOUS
  Administered 2020-03-26: 25 mg via INTRAVENOUS
  Administered 2020-03-26: 10 mg via INTRAVENOUS

## 2020-03-26 NOTE — H&P (Signed)
@LOGO @   Primary Care Physician:  [name], MD Primary Gastroenterologist:  Dr. Gala Romney  Pre-Procedure History & Physical: HPI:  Carlos Leon is a 73 y.o. male is here for a surveillance colonoscopy.  History of adenoma removed 2012.  No GI symptoms currently.  Past Medical History:  Diagnosis Date  . Arthritis   . Atrial fibrillation ([place])    Present by ECG 01/2012  . Colon polyps   . Diverticulosis of colon   . Essential hypertension, benign   . Gout   . Hyperlipidemia   . Obesity     Past Surgical History:  Procedure Laterality Date  . Colon polyps removed  2002   Dr. Tamala Julian  . COLONOSCOPY  2004   Dr. Misty Stanley prep, ACBE showed diverticulosis  . COLONOSCOPY  11/24/2011   Procedure: COLONOSCOPY;  Surgeon: Daneil Dolin, MD;  Location: [facility];  Service: Endoscopy;  Laterality: N/A;  9:10  . HERNIA REPAIR      Prior to Admission medications   Medication Sig Start Date End Date Taking? Authorizing Provider  amLODipine (NORVASC) 10 MG tablet TAKE 1 TABLET EVERY DAY Patient taking differently: Take 10 mg by mouth daily.  11/19/19  Yes [name], MD  aspirin EC 81 MG tablet Take 81 mg by mouth every other day.  03/18/14  Yes [name], MD  benazepril (LOTENSIN) 20 MG tablet TAKE 2 TABLETS ONE TIME DAILY Patient taking differently: Take 40 mg by mouth daily.  03/17/20  Yes Perlie Mayo, NP  Cholecalciferol (VITAMIN D3) 50 MCG (2000 UT) TABS Take 2,000 Units by mouth daily.   Yes [provider]  naproxen sodium (ALEVE) 220 MG tablet Take 440 mg by mouth 2 (two) times daily as needed (knee pain.).   Yes [provider]  allopurinol (ZYLOPRIM) 300 MG tablet Take 1 tablet by mouth once daily Patient not taking: Reported on 03/19/2020 02/04/20   [name], MD  tamsulosin (FLOMAX) 0.4 MG CAPS capsule Take 1 capsule (0.4 mg total) by mouth daily. Patient not taking: Reported on 03/19/2020 11/19/19   [name], MD    Allergies as of 12/26/2019  . (No Known Allergies)    Family History  Problem Relation Age of Onset  . Hypertension Mother   . Cancer Mother        Rectal, approximately 58  . Diabetes Father   . Liver disease Neg Hx   . Colon cancer Neg Hx     Social History   Socioeconomic History  . Marital status: Married    Spouse name: Not on file  . Number of children: 4  . Years of education: 32  . Highest education level: 12th grade  Occupational History  . Occupation: Retired  Tobacco Use  . Smoking status: Former Smoker    Packs/day: 2.00    Years: 5.00    Pack years: 10.00    Types: Cigarettes  . Smokeless tobacco: Never Used  Substance and Sexual Activity  . Alcohol use: No  . Drug use: No  . Sexual activity: Yes  Other Topics Concern  . Not on file  Social History Narrative   Lives with wife alone    Social Determinants of Health   Financial Resource Strain:   . Difficulty of Paying Living Expenses:   Food Insecurity:   . Worried About Charity fundraiser in the Last Year:   . [place] in the Last Year:  Transportation Needs:   . Film/video editor (Medical):   Marland Kitchen Lack of Transportation (Non-Medical):   Physical Activity:   . Days of Exercise per Week:   . Minutes of Exercise per Session:   Stress:   . Feeling of Stress :   Social Connections:   . Frequency of Communication with Friends and Family:   . Frequency of Social Gatherings with Friends and Family:   . Attends Religious Services:   . Active Member of Clubs or Organizations:   . Attends Archivist Meetings:   Marland Kitchen Marital Status:   Intimate Partner Violence:   . Fear of Current or Ex-Partner:   . Emotionally Abused:   Marland Kitchen Physically Abused:   . Sexually Abused:     Review of Systems: See HPI, otherwise negative ROS  Physical Exam: Pulse 79   Temp 98.8 F (37.1 C) (Oral)   Resp 18   Ht 6\' 2"  (1.88 m)   Wt 130.6 kg   BMI 36.98 kg/m  General:   Alert,   Well-developed, well-nourished, pleasant and cooperative in NAD Lungs:  Clear throughout to auscultation.   No wheezes, crackles, or rhonchi. No acute distress. Heart:  Regular rate and rhythm; no murmurs, clicks, rubs,  or gallops. Abdomen:  Soft, nontender and nondistended. No masses, hepatosplenomegaly or hernias noted. Normal bowel sounds, without guarding, and without rebound.    Impression/Plan: Carlos Leon is now here to undergo a surveillance colonoscopy.  History colonic adenoma.  Risks, benefits, limitations, imponderables and alternatives regarding colonoscopy have been reviewed with the patient. Questions have been answered. All parties agreeable.     Notice:  This dictation was prepared with Dragon dictation along with smaller phrase technology. Any transcriptional errors that result from this process are unintentional and may not be corrected upon review.

## 2020-03-26 NOTE — Op Note (Signed)
[name] Patient Name: Carlos Leon Procedure Date: 03/26/2020 9:09 AM MRN: [id number] Date of Birth: [birth year] Attending MD: Norvel Richards , MD CSN: [id number] Age: 74 Admit Type: Outpatient Procedure:                Colonoscopy Indications:              High risk colon cancer surveillance: Personal                            history of colonic polyps Providers:                Norvel Richards, MD, Lurline Del, RN, Ralene Bathe, Technician, Randa Spike, Technician Referring MD:             Norwood Levo. Simpson MD, MD Medicines:                Midazolam 5 mg IV, Meperidine 50 mg IV, Ondansetron                            4 mg IV Complications:            No immediate complications. Estimated Blood Loss:     Estimated blood loss was minimal. Estimated blood                            loss was minimal. Procedure:                After obtaining informed consent, the colonoscope                            was passed under direct vision. Throughout the                            procedure, the patient's blood pressure, pulse, and                            oxygen saturations were monitored continuously. The                            [id number]-[id number]) scope was introduced through                            the anus and advanced to the the cecum, identified                            by appendiceal orifice and ileocecal valve. The                            colonoscopy was performed without difficulty. The                            patient tolerated the procedure well. The quality  of the bowel preparation was adequate. Scope In: 9:33:13 AM Scope Out: 9:51:15 AM Scope Withdrawal Time: 0 hours 12 minutes 40 seconds  Total Procedure Duration: 0 hours 18 minutes 2 seconds  Findings:      The perianal and digital rectal examinations were normal.      Scattered medium-mouthed diverticula were found in the sigmoid  colon and       descending colon.      Three sessile polyps were found in the descending colon and cecum. The       polyps were 4 to 6 mm in size. These polyps were removed with a cold       snare. Resection and retrieval were complete. Estimated blood loss was       minimal.      The exam was otherwise without abnormality on direct and retroflexion       views. Impression:               - Diverticulosis in the sigmoid colon and in the                            descending colon.                           - No specimens collected. Moderate Sedation:      Moderate (conscious) sedation was administered by the endoscopy nurse       and supervised by the endoscopist. The following parameters were       monitored: oxygen saturation, heart rate, blood pressure, respiratory       rate, EKG, adequacy of pulmonary ventilation, and response to care.       Total physician intraservice time was 25 minutes. Recommendation:           - Patient has a contact number available for                            emergencies. The signs and symptoms of potential                            delayed complications were discussed with the                            patient. Return to normal activities tomorrow.                            Written discharge instructions were provided to the                            patient.                           - Advance diet as tolerated.                           - Continue present medications.                           - Repeat colonoscopy date to be determined after  pending pathology results are reviewed for                            surveillance based on pathology results.                           - Return to GI office (date not yet determined). Procedure Code(s):        --- Professional ---                           [id number], Colonoscopy, flexible; with removal of                            tumor(s), polyp(s), or other lesion(s) by snare                             technique                           99153, Moderate sedation; each additional 15                            minutes intraservice time                           G0500, Moderate sedation services provided by the                            same physician or other qualified health care                            professional performing a gastrointestinal                            endoscopic service that sedation supports,                            requiring the presence of an independent trained                            observer to assist in the monitoring of the                            patient's level of consciousness and physiological                            status; initial 15 minutes of intra-service time;                            patient age 5 years or older (additional time may                            be reported with [phone], as appropriate) Diagnosis Code(s):        --- Professional ---  Z86.010, Personal history of colonic polyps                           K57.30, Diverticulosis of large intestine without                            perforation or abscess without bleeding CPT copyright 2019 American Medical Association. All rights reserved. The codes documented in this report are preliminary and upon coder review may  be revised to meet current compliance requirements. Cristopher Estimable. Abhiraj Dozal, MD Norvel Richards, MD 03/26/2020 9:56:52 AM This report has been signed electronically. Number of Addenda: 0

## 2020-03-26 NOTE — Discharge Instructions (Signed)
Colonoscopy Discharge Instructions  Read the instructions outlined below and refer to this sheet in the next few weeks. These discharge instructions provide you with general information on caring for yourself after you leave the hospital. Your doctor may also give you specific instructions. While your treatment has been planned according to the most current medical practices available, unavoidable complications occasionally occur. If you have any problems or questions after discharge, call Dr. Gala Romney at [phone]. ACTIVITY  You may resume your regular activity, but move at a slower pace for the next 24 hours.   Take frequent rest periods for the next 24 hours.   Walking will help get rid of the air and reduce the bloated feeling in your belly (abdomen).   No driving for 24 hours (because of the medicine (anesthesia) used during the test).    Do not sign any important legal documents or operate any machinery for 24 hours (because of the anesthesia used during the test).  NUTRITION  Drink plenty of fluids.   You may resume your normal diet as instructed by your doctor.   Begin with a light meal and progress to your normal diet. Heavy or fried foods are harder to digest and may make you feel sick to your stomach (nauseated).   Avoid alcoholic beverages for 24 hours or as instructed.  MEDICATIONS  You may resume your normal medications unless your doctor tells you otherwise.  WHAT YOU CAN EXPECT TODAY  Some feelings of bloating in the abdomen.   Passage of more gas than usual.   Spotting of blood in your stool or on the toilet paper.  IF YOU HAD POLYPS REMOVED DURING THE COLONOSCOPY:  No aspirin products for 7 days or as instructed.   No alcohol for 7 days or as instructed.   Eat a soft diet for the next 24 hours.  FINDING OUT THE RESULTS OF YOUR TEST Not all test results are available during your visit. If your test results are not back during the visit, make an appointment  with your caregiver to find out the results. Do not assume everything is normal if you have not heard from your caregiver or the medical facility. It is important for you to follow up on all of your test results.  SEEK IMMEDIATE MEDICAL ATTENTION IF:  You have more than a spotting of blood in your stool.   Your belly is swollen (abdominal distention).   You are nauseated or vomiting.   You have a temperature over 101.   You have abdominal pain or discomfort that is severe or gets worse throughout the day.   Colon polyp and diverticulosis information provided  Further recommendations to follow pending review of pathology report  At patient request request, I called Rajesh Dhaliwal at [phone] and reviewed results.   Colon Polyps  Polyps are tissue growths inside the body. Polyps can grow in many places, including the large intestine (colon). A polyp may be a round bump or a mushroom-shaped growth. You could have one polyp or several. Most colon polyps are noncancerous (benign). However, some colon polyps can become cancerous over time. Finding and removing the polyps early can help prevent this. What are the causes? The exact cause of colon polyps is not known. What increases the risk? You are more likely to develop this condition if you:  Have a family history of colon cancer or colon polyps.  Are older than 64 or older than 45 if you are African American.  Have inflammatory  bowel disease, such as ulcerative colitis or Crohn's disease.  Have certain hereditary conditions, such as: ? Familial adenomatous polyposis. ? Lynch syndrome. ? Turcot syndrome. ? Peutz-Jeghers syndrome.  Are overweight.  Smoke cigarettes.  Do not get enough exercise.  Drink too much alcohol.  Eat a diet that is high in fat and red meat and low in fiber.  Had childhood cancer that was treated with abdominal radiation. What are the signs or symptoms? Most polyps do not cause symptoms. If you  have symptoms, they may include:  Blood coming from your rectum when having a bowel movement.  Blood in your stool. The stool may look dark red or black.  Abdominal pain.  A change in bowel habits, such as constipation or diarrhea. How is this diagnosed? This condition is diagnosed with a colonoscopy. This is a procedure in which a lighted, flexible scope is inserted into the anus and then passed into the colon to examine the area. Polyps are sometimes found when a colonoscopy is done as part of routine cancer screening tests. How is this treated? Treatment for this condition involves removing any polyps that are found. Most polyps can be removed during a colonoscopy. Those polyps will then be tested for cancer. Additional treatment may be needed depending on the results of testing. Follow these instructions at home: Lifestyle  Maintain a healthy weight, or lose weight if recommended by your health care provider.  Exercise every day or as told by your health care provider.  Do not use any products that contain nicotine or tobacco, such as cigarettes and e-cigarettes. If you need help quitting, ask your health care provider.  If you drink alcohol, limit how much you have: ? 0-1 drink a day for women. ? 0-2 drinks a day for men.  Be aware of how much alcohol is in your drink. In the [place] one drink equals one 12 oz bottle of beer (355 mL), one 5 oz glass of wine (148 mL), or one 1 oz shot of hard liquor (44 mL). Eating and drinking   Eat foods that are high in fiber, such as fruits, vegetables, and whole grains.  Eat foods that are high in calcium and vitamin D, such as milk, cheese, yogurt, eggs, liver, fish, and broccoli.  Limit foods that are high in fat, such as fried foods and desserts.  Limit the amount of red meat and processed meat you eat, such as hot dogs, sausage, bacon, and lunch meats. General instructions  Keep all follow-up visits as told by your health care  provider. This is important. ? This includes having regularly scheduled colonoscopies. ? Talk to your health care provider about when you need a colonoscopy. Contact a health care provider if:  You have new or worsening bleeding during a bowel movement.  You have new or increased blood in your stool.  You have a change in bowel habits.  You lose weight for no known reason. Summary  Polyps are tissue growths inside the body. Polyps can grow in many places, including the colon.  Most colon polyps are noncancerous (benign), but some can become cancerous over time.  This condition is diagnosed with a colonoscopy.  Treatment for this condition involves removing any polyps that are found. Most polyps can be removed during a colonoscopy. This information is not intended to replace advice given to you by your health care provider. Make sure you discuss any questions you have with your health care provider. Document Revised: 03/30/2018 Document Reviewed:  03/30/2018 Elsevier Patient Education  [place].   Diverticulosis  Diverticulosis is a condition that develops when small pouches (diverticula) form in the wall of the large intestine (colon). The colon is where water is absorbed and stool (feces) is formed. The pouches form when the inside layer of the colon pushes through weak spots in the outer layers of the colon. You may have a few pouches or many of them. The pouches usually do not cause problems unless they become inflamed or infected. When this happens, the condition is called diverticulitis. What are the causes? The cause of this condition is not known. What increases the risk? The following factors may make you more likely to develop this condition:  Being older than age 39. Your risk for this condition increases with age. Diverticulosis is rare among people younger than age 50. By age 64, many people have it.  Eating a low-fiber diet.  Having frequent  constipation.  Being overweight.  Not getting enough exercise.  Smoking.  Taking over-the-counter pain medicines, like aspirin and ibuprofen.  Having a family history of diverticulosis. What are the signs or symptoms? In most people, there are no symptoms of this condition. If you do have symptoms, they may include:  Bloating.  Cramps in the abdomen.  Constipation or diarrhea.  Pain in the lower left side of the abdomen. How is this diagnosed? Because diverticulosis usually has no symptoms, it is most often diagnosed during an exam for other colon problems. The condition may be diagnosed by:  Using a flexible scope to examine the colon (colonoscopy).  Taking an X-ray of the colon after dye has been put into the colon (barium enema).  Having a CT scan. How is this treated? You may not need treatment for this condition. Your health care provider may recommend treatment to prevent problems. You may need treatment if you have symptoms or if you previously had diverticulitis. Treatment may include:  Eating a high-fiber diet.  Taking a fiber supplement.  Taking a live bacteria supplement (probiotic).  Taking medicine to relax your colon. Follow these instructions at home: Medicines  Take over-the-counter and prescription medicines only as told by your health care provider.  If told by your health care provider, take a fiber supplement or probiotic. Constipation prevention Your condition may cause constipation. To prevent or treat constipation, you may need to:  Drink enough fluid to keep your urine pale yellow.  Take over-the-counter or prescription medicines.  Eat foods that are high in fiber, such as beans, whole grains, and fresh fruits and vegetables.  Limit foods that are high in fat and processed sugars, such as fried or sweet foods.  General instructions  Try not to strain when you have a bowel movement.  Keep all follow-up visits as told by your health  care provider. This is important. Contact a health care provider if you:  Have pain in your abdomen.  Have bloating.  Have cramps.  Have not had a bowel movement in 3 days. Get help right away if:  Your pain gets worse.  Your bloating becomes very bad.  You have a fever or chills, and your symptoms suddenly get worse.  You vomit.  You have bowel movements that are bloody or black.  You have bleeding from your rectum. Summary  Diverticulosis is a condition that develops when small pouches (diverticula) form in the wall of the large intestine (colon).  You may have a few pouches or many of them.  This  condition is most often diagnosed during an exam for other colon problems.  Treatment may include increasing the fiber in your diet, taking supplements, or taking medicines. This information is not intended to replace advice given to you by your health care provider. Make sure you discuss any questions you have with your health care provider. Document Revised: 07/12/2019 Document Reviewed: 07/12/2019 Elsevier Patient Education  [place] POST-ANESTHESIA  IMMEDIATELY FOLLOWING SURGERY:  Do not drive or operate machinery for the first twenty four hours after surgery.  Do not make any important decisions for twenty four hours after surgery or while taking narcotic pain medications or sedatives.  If you develop intractable nausea and vomiting or a severe headache please notify your doctor immediately.  FOLLOW-UP:  Please make an appointment with your surgeon as instructed. You do not need to follow up with anesthesia unless specifically instructed to do so.  WOUND CARE INSTRUCTIONS (if applicable):  Keep a dry clean dressing on the anesthesia/puncture wound site if there is drainage.  Once the wound has quit draining you may leave it open to air.  Generally you should leave the bandage intact for twenty four hours unless there is drainage.  If the  epidural site drains for more than 36-48 hours please call the anesthesia department.  QUESTIONS?:  Please feel free to call your physician or the hospital operator if you have any questions, and they will be happy to assist you.

## 2020-03-27 LAB — SURGICAL PATHOLOGY

## 2020-03-28 ENCOUNTER — Encounter: Payer: Self-pay | Admitting: Internal Medicine

## 2020-04-17 ENCOUNTER — Ambulatory Visit: Payer: Medicare HMO | Admitting: Family Medicine

## 2020-05-07 ENCOUNTER — Other Ambulatory Visit: Payer: Self-pay | Admitting: Family Medicine

## 2020-05-21 ENCOUNTER — Encounter: Payer: Self-pay | Admitting: Family Medicine

## 2020-05-21 ENCOUNTER — Telehealth (INDEPENDENT_AMBULATORY_CARE_PROVIDER_SITE_OTHER): Payer: Medicare HMO | Admitting: Family Medicine

## 2020-05-21 ENCOUNTER — Other Ambulatory Visit: Payer: Self-pay

## 2020-05-21 ENCOUNTER — Ambulatory Visit: Payer: Medicare HMO | Admitting: Family Medicine

## 2020-05-21 DIAGNOSIS — E785 Hyperlipidemia, unspecified: Secondary | ICD-10-CM

## 2020-05-21 DIAGNOSIS — R7303 Prediabetes: Secondary | ICD-10-CM | POA: Diagnosis not present

## 2020-05-21 DIAGNOSIS — I4891 Unspecified atrial fibrillation: Secondary | ICD-10-CM | POA: Diagnosis not present

## 2020-05-21 DIAGNOSIS — R972 Elevated prostate specific antigen [PSA]: Secondary | ICD-10-CM | POA: Diagnosis not present

## 2020-05-21 DIAGNOSIS — I10 Essential (primary) hypertension: Secondary | ICD-10-CM

## 2020-05-21 MED ORDER — BENAZEPRIL HCL 20 MG PO TABS: ORAL_TABLET | ORAL | 1 refills | Status: DC

## 2020-05-21 NOTE — Progress Notes (Signed)
Virtual Visit via Telephone Note   This visit type was conducted due to national recommendations for restrictions regarding the COVID-19 Pandemic (e.g. social distancing) in an effort to limit this patient's exposure and mitigate transmission in our community.  Due to his co-morbid illnesses, this patient is at least at moderate risk for complications without adequate follow up.  This format is felt to be most appropriate for this patient at this time.  The patient did not have access to video technology/had technical difficulties with video requiring transitioning to audio format only (telephone).  All issues noted in this document were discussed and addressed.  No physical exam could be performed with this format.    Evaluation Performed:  Follow-up visit  Date:  05/23/2020   ID:  Carlos Leon, DOB Mar 25, 1946, MRN [id number]  Patient Location: Home Provider Location: Office  Location of Patient: Home Location of Provider: Telehealth Consent was obtain for visit to be over via telehealth. I verified that I am speaking with the correct person using two identifiers.  PCP:  [name], MD   Chief Complaint: follow up on chronic conditions   History of Present Illness:    Carlos Leon is a 74 y.o. male with history of arthritis, atrial fibrillation, diverticulitis, hypertension, hyperlipidemia, obesity among others.  Additionally has had elevated PSA and has not had follow-up regarding this.  Reports that he is doing well denies having any changes in skin, sleep, bowel or bladder habits, recent falls, change in memory, change in appetite or weight.  Reports that he does not have blood in his urine or stool.  Denies having any chest pain, shortness of breath, cough, fever, chills, palpitations, leg swelling, headaches, dizziness, vision changes.  The patient does not have symptoms concerning for COVID-19 infection (fever, chills, cough, or new shortness of breath).   Past  Medical, Surgical, Social History, Allergies, and Medications have been Reviewed.  Past Medical History:  Diagnosis Date  . Arthritis   . Atrial fibrillation ([place])    Present by ECG 01/2012  . Colon polyps   . Diverticulosis of colon   . Educated about COVID-19 virus infection 05/25/2019  . 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 Essential hypertension, benign   . Gout   . Hyperlipidemia   . Metabolic syndrome X [id number]  . Obesity    Past Surgical History:  Procedure Laterality Date  . Colon polyps removed  2002   Dr. Tamala Julian  . COLONOSCOPY  2004   Dr. Misty Stanley prep, ACBE showed diverticulosis  . COLONOSCOPY  11/24/2011   Procedure: COLONOSCOPY;  Surgeon: Daneil Dolin, MD;  [facility];  Service: Endoscopy;  Laterality: N/A;  9:10  . COLONOSCOPY N/A 03/26/2020   Procedure: COLONOSCOPY;  Surgeon: Daneil Dolin, MD;  [facility];  Service: Endoscopy;  Laterality: N/A;  9:30  . HERNIA REPAIR       Current Meds  Medication Sig  . amLODipine (NORVASC) 10 MG tablet TAKE 1 TABLET EVERY DAY  . aspirin EC 81 MG tablet Take 81 mg by mouth every other day.   . benazepril (LOTENSIN) 20 MG tablet TAKE 2 TABLETS ONE TIME DAILY  . Cholecalciferol (VITAMIN D3) 50 MCG (2000 UT) TABS Take 2,000 Units by mouth daily.  . naproxen sodium (ALEVE) 220 MG tablet Take 440 mg by mouth 2 (two) times daily as needed (knee pain.).  . [DISCONTINUED] benazepril (  LOTENSIN) 20 MG tablet TAKE 2 TABLETS ONE TIME DAILY (Patient taking differently: Take 40 mg by mouth daily. )     Allergies:   Patient has no known allergies.   ROS:   Please see the history of present illness.    All other systems reviewed and are negative.   Labs/Other Tests and Data Reviewed:    Recent Labs: 05/29/2019: Hemoglobin 13.5; Platelets 184 11/19/2019: ALT 22; BUN 16; Creat 1.12; Potassium 4.6; Sodium 138; TSH 1.12   Recent Lipid Panel Lab  Results  Component Value Date/Time   CHOL 189 11/19/2019 10:20 AM   TRIG 102 11/19/2019 10:20 AM   HDL 45 11/19/2019 10:20 AM   CHOLHDL 4.2 11/19/2019 10:20 AM   LDLCALC 124 (H) 11/19/2019 10:20 AM   LDLDIRECT 125 05/20/2016 04:45 PM    Wt Readings from Last 3 Encounters:  05/21/20 279 lb (126.6 kg)  03/26/20 288 lb (130.6 kg)  01/18/20 288 lb (130.6 kg)     Objective:    Vital Signs:  BP 107/66   Ht 6\' 2"  (1.88 m)   Wt 279 lb (126.6 kg)   BMI 35.82 kg/m    VITAL SIGNS:  reviewed GEN:  Alert and oriented RESPIRATORY:  No shortness of breath noted in conversation PSYCH:  Normal affect and mood  ASSESSMENT & PLAN:   1. Morbid obesity ([place])  2. Atrial fibrillation, unspecified type ([facility])  - CBC - COMPLETE METABOLIC PANEL WITH GFR  3. Essential hypertension, benign  - benazepril (LOTENSIN) 20 MG tablet; TAKE 2 TABLETS ONE TIME DAILY  Dispense: 180 tablet; Refill: 1 - CBC - COMPLETE METABOLIC PANEL WITH GFR  4. Elevated PSA  - PSA  5. Hyperlipidemia, unspecified hyperlipidemia type  - Lipid panel  6. Prediabetes  - Hemoglobin A1c   Time:   Today, I have spent 15  minutes with the patient with telehealth technology discussing the above problems.     Medication Adjustments/Labs and Tests Ordered: Current medicines are reviewed at length with the patient today.  Concerns regarding medicines are outlined above.   Tests Ordered: Orders Placed This Encounter  Procedures  . CBC  . COMPLETE METABOLIC PANEL WITH GFR  . Hemoglobin A1c  . Lipid panel  . PSA    Medication Changes: Meds ordered this encounter  Medications  . benazepril (LOTENSIN) 20 MG tablet    Sig: TAKE 2 TABLETS ONE TIME DAILY    Dispense:  180 tablet    Refill:  1    Disposition:  Follow up November for annual Signed, Perlie Mayo, NP  05/23/2020 3:04 PM     [facility]

## 2020-05-21 NOTE — Assessment & Plan Note (Signed)
Carlos Leon is encouraged to maintain a well balanced diet that is low in salt. Controlled, continue current medication regimen.  Additionally, he is also reminded that exercise is beneficial for heart health and control of  Blood pressure. 30-60 minutes daily is recommended-walking was suggested.

## 2020-05-23 ENCOUNTER — Encounter: Payer: Self-pay | Admitting: Family Medicine

## 2020-05-23 NOTE — Assessment & Plan Note (Signed)
Obesity is linked to hypertension, hyperlipidemia  Carlos Leon is re-educated about the importance of exercise daily to help with weight management. A minumum of 30 minutes daily is recommended. Additionally, importance of healthy food choices  with portion control discussed.   Wt Readings from Last 3 Encounters:  05/21/20 279 lb (126.6 kg)  03/26/20 288 lb (130.6 kg)  01/18/20 288 lb (130.6 kg)

## 2020-05-23 NOTE — Assessment & Plan Note (Signed)
Asymptomatic, anticoagulation was indicated however he continues to refuse.

## 2020-05-23 NOTE — Assessment & Plan Note (Signed)
Updated labs ordered.

## 2020-05-23 NOTE — Patient Instructions (Signed)
I appreciate the opportunity to provide you with care for your health and wellness. Today we discussed: Overall health  Follow up: November for annual, labs 1 week before  Fasting labs 1 week before your next appointment in November  No referrals  Please continue to practice social distancing to keep you, your family, and our community safe.  If you must go out, please wear a mask and practice good handwashing.  It was a pleasure to see you and I look forward to continuing to work together on your health and well-being. Please do not hesitate to call the office if you need care or have questions about your care.  Have a wonderful day and week. With Gratitude, Cherly Beach, DNP, AGNP-BC

## 2020-05-23 NOTE — Assessment & Plan Note (Addendum)
Currently not taking anything for cholesterol updated labs ordered prior to next appointment. Encouraged heart healthy low-fat diet.

## 2020-05-23 NOTE — Assessment & Plan Note (Signed)
Updated PSA ordered.  Refusal for repeat urology evaluation.

## 2020-06-20 DIAGNOSIS — H52229 Regular astigmatism, unspecified eye: Secondary | ICD-10-CM | POA: Diagnosis not present

## 2020-06-20 DIAGNOSIS — Z0100 Encounter for examination of eyes and vision without abnormal findings: Secondary | ICD-10-CM | POA: Diagnosis not present

## 2020-08-04 ENCOUNTER — Other Ambulatory Visit: Payer: Self-pay

## 2020-08-04 ENCOUNTER — Ambulatory Visit: Payer: Medicare HMO | Admitting: Orthopedic Surgery

## 2020-08-04 ENCOUNTER — Ambulatory Visit: Payer: Medicare HMO

## 2020-08-04 ENCOUNTER — Encounter: Payer: Self-pay | Admitting: Orthopedic Surgery

## 2020-08-04 VITALS — BP 152/92 | HR 75 | Ht 73.5 in | Wt 289.0 lb

## 2020-08-04 DIAGNOSIS — M25562 Pain in left knee: Secondary | ICD-10-CM | POA: Diagnosis not present

## 2020-08-04 DIAGNOSIS — G8929 Other chronic pain: Secondary | ICD-10-CM

## 2020-08-04 NOTE — Progress Notes (Signed)
NEW PROBLEM//OFFICE VISIT  Chief Complaint  Patient presents with  . Knee Pain    Left knee hurting and feels like it may be bone on bone. hurts worse when playing golf. feels like it may give way.     74 year old male presents with 35-month history of left knee pain on the medial aspect of the joint associated with pivoting and occasionally giving way.  However his symptoms only occur when he is playing golf.  He does take some Aleve on occasion symptoms seem to last for about 1 week   Review of Systems  Constitutional: Negative for fever.  All other systems reviewed and are negative.    Past Medical History:  Diagnosis Date  . Arthritis   . Atrial fibrillation ([place])    Present by ECG 01/2012  . Colon polyps   . Diverticulosis of colon   . Educated about COVID-19 virus infection 05/25/2019  . 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 Essential hypertension, benign   . Gout   . Hyperlipidemia   . Metabolic syndrome X 01/01/1095  . Obesity     Past Surgical History:  Procedure Laterality Date  . Colon polyps removed  2002   Dr. Tamala Julian  . COLONOSCOPY  2004   Dr. Misty Norbert Malkin prep, ACBE showed diverticulosis  . COLONOSCOPY  11/24/2011   Procedure: COLONOSCOPY;  Surgeon: Daneil Dolin, MD;  [facility];  Service: Endoscopy;  Laterality: N/A;  9:10  . COLONOSCOPY N/A 03/26/2020   Procedure: COLONOSCOPY;  Surgeon: Daneil Dolin, MD;  [facility];  Service: Endoscopy;  Laterality: N/A;  9:30  . HERNIA REPAIR      Family History  Problem Relation Age of Onset  . Hypertension Mother   . Cancer Mother        Rectal, approximately 75  . Diabetes Father   . Liver disease Neg Hx   . Colon cancer Neg Hx    Social History   Tobacco Use  . Smoking status: Former Smoker    Packs/day: 2.00    Years: 5.00    Pack years: 10.00    Types: Cigarettes  . Smokeless tobacco: Never Used  Vaping Use  .  Vaping Use: Never used  Substance Use Topics  . Alcohol use: No  . Drug use: No    No Known Allergies  Current Meds  Medication Sig  . amLODipine (NORVASC) 10 MG tablet TAKE 1 TABLET EVERY DAY  . aspirin EC 81 MG tablet Take 81 mg by mouth every other day.   . benazepril (LOTENSIN) 20 MG tablet TAKE 2 TABLETS ONE TIME DAILY  . Cholecalciferol (VITAMIN D3) 50 MCG (2000 UT) TABS Take 2,000 Units by mouth daily.  . ferrous sulfate 324 MG TBEC Take 324 mg by mouth daily.  . naproxen sodium (ALEVE) 220 MG tablet Take 440 mg by mouth 2 (two) times daily as needed (knee pain.).    BP (!) 152/92   Pulse 75   Ht 6' 1.5" (1.867 m)   Wt 289 lb (131.1 kg)   BMI 37.61 kg/m   Physical Exam Vitals and nursing note reviewed.  Constitutional:      Appearance: He is obese.  Skin:    General: Skin is warm and dry.     Capillary Refill: Capillary refill takes less than 2 seconds.  Neurological:     General: No focal deficit present.  Mental Status: He is alert and oriented to person, place, and time.  Psychiatric:        Mood and Affect: Mood normal.        Thought Content: Thought content normal.     Ortho Exam  Left knee knee medial joint line tenderness no effusion patient still has excellent flexion of his knee with no instability muscle strength and tone are normal skin shows no lesions or prior surgical scars   MEDICAL DECISION MAKING  A.  Encounter Diagnosis  Name Primary?  . Chronic pain of left knee Yes    B. DATA ANALYSED:  Office x-rays show the knee is still in valgus with a 3 degree femoral-tibial angle patient has narrowing of the medial compartment with subchondral sclerosis and then peripheral osteophytes in the patellofemoral joint  Impression moderately severe arthritis left knee   C. MANAGEMENT   Since his symptoms seem to only occur around his golfing activities.  We are going to have him take the Aleve before he golfs and then for several days after  until his symptoms cooldown  If his symptoms become daily we can put him on arthritis medication otherwise we will see him in a year for repeat x-ray  We discussed that eventually this knee will probably progressively get worse and needed knee replacement No orders of the defined types were placed in this encounter.     Arther Abbott, MD  08/04/2020 10:10 AM

## 2020-08-04 NOTE — Patient Instructions (Signed)
Take medication before the golf and also after

## 2020-08-14 ENCOUNTER — Ambulatory Visit: Payer: Medicare HMO | Admitting: Family Medicine

## 2020-09-16 ENCOUNTER — Other Ambulatory Visit: Payer: Self-pay | Admitting: Family Medicine

## 2020-09-17 ENCOUNTER — Encounter: Payer: Self-pay | Admitting: Family Medicine

## 2020-09-17 ENCOUNTER — Other Ambulatory Visit: Payer: Self-pay

## 2020-09-17 ENCOUNTER — Ambulatory Visit (INDEPENDENT_AMBULATORY_CARE_PROVIDER_SITE_OTHER): Payer: Medicare HMO | Admitting: Family Medicine

## 2020-09-17 VITALS — BP 154/86 | HR 72 | Resp 16 | Ht 73.5 in | Wt 286.0 lb

## 2020-09-17 DIAGNOSIS — Z2821 Immunization not carried out because of patient refusal: Secondary | ICD-10-CM

## 2020-09-17 DIAGNOSIS — I10 Essential (primary) hypertension: Secondary | ICD-10-CM | POA: Diagnosis not present

## 2020-09-17 DIAGNOSIS — B360 Pityriasis versicolor: Secondary | ICD-10-CM | POA: Insufficient documentation

## 2020-09-17 DIAGNOSIS — R7303 Prediabetes: Secondary | ICD-10-CM | POA: Diagnosis not present

## 2020-09-17 DIAGNOSIS — E785 Hyperlipidemia, unspecified: Secondary | ICD-10-CM

## 2020-09-17 MED ORDER — FLUCONAZOLE 150 MG PO TABS: ORAL_TABLET | ORAL | 0 refills | Status: DC

## 2020-09-17 MED ORDER — HYDROCHLOROTHIAZIDE 25 MG PO TABS: 25.0000 mg | ORAL_TABLET | Freq: Every day | ORAL | 0 refills | Status: DC

## 2020-09-17 MED ORDER — TERBINAFINE HCL 250 MG PO TABS: 250.0000 mg | ORAL_TABLET | Freq: Every day | ORAL | 0 refills | Status: DC

## 2020-09-17 MED ORDER — KETOCONAZOLE 2 % EX SHAM: MEDICATED_SHAMPOO | CUTANEOUS | 0 refills | Status: DC

## 2020-09-17 NOTE — Patient Instructions (Addendum)
Annual physical exam when due please schedule  Additional medication prescribed for blood pressure, hCTZ 25 mg daily  Three meedications are prescribed for rash  Labs pSA, lipid, cmp and eGF, hBA1C, cBC and TSH fasting nOV 24 or after  It is important that you exercise regularly at least 30 minutes 5 times a week. If you develop chest pain, have severe difficulty breathing, or feel very tired, stop exercising immediately and seek medical attention   Think about what you will eat, plan ahead. Choose " clean, green, fresh or frozen" over canned, processed or packaged foods which are more sugary, salty and fatty. 70 to 75% of food eaten should be vegetables and fruit. Three meals at set times with snacks allowed between meals, but they must be fruit or vegetables. Aim to eat over a 12 hour period , example 7 am to 7 pm, and STOP after  your last meal of the day. Drink water,generally about 64 ounces per day, no other drink is as healthy. Fruit juice is best enjoyed in a healthy way, by EATING the fruit. Thanks for choosing [facility], we consider it a privelige to serve you.

## 2020-09-17 NOTE — Assessment & Plan Note (Signed)
Obesity linked with hTN, prediabetes, and hyperlipidemia  Patient re-educated about  the importance of commitment to a  minimum of 150 minutes of exercise per week as able.  The importance of healthy food choices with portion control discussed, as well as eating regularly and within a 12 hour window most days. The need to choose "clean , green" food 50 to 75% of the time is discussed, as well as to make water the primary drink and set a goal of 64 ounces water daily.    Weight /BMI 09/17/2020 08/04/2020 05/21/2020  WEIGHT 286 lb 289 lb 279 lb  HEIGHT 6' 1.5" 6' 1.5" 6\' 2"   BMI 37.22 kg/m2 37.61 kg/m2 35.82 kg/m2

## 2020-09-17 NOTE — Assessment & Plan Note (Signed)
Hyperlipidemia:Low fat diet discussed and encouraged.   Lipid Panel  Lab Results  Component Value Date   CHOL 189 11/19/2019   HDL 45 11/19/2019   LDLCALC 124 (H) 11/19/2019   LDLDIRECT 125 05/20/2016   TRIG 102 11/19/2019   CHOLHDL 4.2 11/19/2019     Updated lab needed at/ before next visit.

## 2020-09-17 NOTE — Progress Notes (Signed)
Carlos Leon     MRN: [id number]      DOB: 1946-04-17   HPI Carlos Leon is here with a 2 monh h/o itchy rash which started on right shoulder and has gradually spread to involve  Back of head/ scalp, upper back an both jaws No draiage  Or fever or chills.   Brother  Has vitiligo Son has similar  rash ROS Denies recent fever or chills. Denies sinus pressure, nasal congestion, ear pain or sore throat. Denies chest congestion, productive cough or wheezing. Denies chest pains, palpitations and leg swelling Denies abdominal pain, nausea, vomiting,diarrhea or constipation.   Denies dysuria, frequency, hesitancy or incontinence. Denies joint pain, swelling and limitation in mobility. Denies headaches, seizures, numbness, or tingling. Denies depression, anxiety or insomnia.  PE  BP (!) 154/86   Pulse 72   Resp 16   Ht 6' 1.5" (1.867 m)   Wt 286 lb (129.7 kg)   SpO2 98%   BMI 37.22 kg/m   Patient alert and oriented and in no cardiopulmonary distress.  HEENT: No facial asymmetry, EOMI,     Neck supple .  Chest: Clear to auscultation bilaterally.  CVS: S1, S2 no murmurs, no S3.Regular rate.  ABD: Soft non tender.   Ext: No edema  MS: Adequate ROM spine, shoulders, hips and knees.  Skin: Intact,hypopigmented rash noted.macular annular on posterior neck, posterior scalp, upper back and beard   Psych: Good eye contact, normal affect. Memory intact not anxious or depressed appearing.  CNS: CN 2-12 intact, power,  normal throughout.no focal deficits noted.   Assessment & Plan Tinea versicolor 3 month history,  Topical and oral meds prescribed refer dermatolgy in 4 to 6 weeks if no response  Essential hypertension, benign Uncontrolled add HCTZ. DASH diet and commitment to daily physical activity for a minimum of 30 minutes discussed and encouraged, as a part of hypertension management. The importance of attaining a healthy weight is also discussed.  BP/Weight  09/17/2020 08/04/2020 05/21/2020 03/26/2020 01/18/2020 11/19/2019 6/64/4034  Systolic BP 742 595 638 756 433 295 188  Diastolic BP 86 92 66 66 82 82 104  Wt. (Lbs) 286 289 279 288 288 288 277  BMI 37.22 37.61 35.82 36.98 36.98 36.98 35.56       Prediabetes Patient educated about the importance of limiting  Carbohydrate intake , the need to commit to daily physical activity for a minimum of 30 minutes , and to commit weight loss. The fact that changes in all these areas will reduce or eliminate all together the development of diabetes is stressed.  Updated lab needed at/ before next visit.  Diabetic Labs Latest Ref Rng & Units 11/19/2019 05/29/2019 10/03/2018 10/01/2017 04/29/2017  HbA1c <5.7 % of total Hgb 6.4(H) 6.1(H) 6.1(H) - 6.0(H)  Microalbumin 0.00 - 1.89 mg/dL - - - - -  Micro/Creat Ratio 0.0 - 30.0 mg/g - - - - -  Chol <200 mg/dL 189 200(H) 199 - 185  HDL > OR = 40 mg/dL 45 45 45 - 48  Calc LDL mg/dL (calc) 124(H) 134(H) 134(H) - 116(H)  Triglycerides <150 mg/dL 102 99 98 - 103  Creatinine 0.70 - 1.18 mg/dL 1.12 1.30(H) 1.19(H) 1.18 1.15   BP/Weight 09/17/2020 08/04/2020 05/21/2020 03/26/2020 01/18/2020 11/19/2019 04/11/6062  Systolic BP 016 010 932 355 732 202 542  Diastolic BP 86 92 66 66 82 82 104  Wt. (Lbs) 286 289 279 288 288 288 277  BMI 37.22 37.61 35.82 36.98 36.98 36.98 35.56  Foot/eye exam completion dates 05/20/2016  Foot Form Completion Done      Hyperlipemia Hyperlipidemia:Low fat diet discussed and encouraged.   Lipid Panel  Lab Results  Component Value Date   CHOL 189 11/19/2019   HDL 45 11/19/2019   LDLCALC 124 (H) 11/19/2019   LDLDIRECT 125 05/20/2016   TRIG 102 11/19/2019   CHOLHDL 4.2 11/19/2019     Updated lab needed at/ before next visit.   Morbid obesity Obesity linked with hTN, prediabetes, and hyperlipidemia  Patient re-educated about  the importance of commitment to a  minimum of 150 minutes of exercise per week as able.  The importance of  healthy food choices with portion control discussed, as well as eating regularly and within a 12 hour window most days. The need to choose "clean , green" food 50 to 75% of the time is discussed, as well as to make water the primary drink and set a goal of 64 ounces water daily.    Weight /BMI 09/17/2020 08/04/2020 05/21/2020  WEIGHT 286 lb 289 lb 279 lb  HEIGHT 6' 1.5" 6' 1.5" 6\' 2"   BMI 37.22 kg/m2 37.61 kg/m2 35.82 kg/m2      Refused H1N1 influenza virus immunization Pt re educated, vaccine offered , and he again refuses

## 2020-09-17 NOTE — Assessment & Plan Note (Signed)
3 month history,  Topical and oral meds prescribed refer dermatolgy in 4 to 6 weeks if no response

## 2020-09-17 NOTE — Assessment & Plan Note (Signed)
Pt re educated, vaccine offered , and he again refuses

## 2020-09-17 NOTE — Assessment & Plan Note (Signed)
Uncontrolled add HCTZ. DASH diet and commitment to daily physical activity for a minimum of 30 minutes discussed and encouraged, as a part of hypertension management. The importance of attaining a healthy weight is also discussed.  BP/Weight 09/17/2020 08/04/2020 05/21/2020 03/26/2020 01/18/2020 11/19/2019 2/70/3500  Systolic BP 938 182 993 716 967 893 810  Diastolic BP 86 92 66 66 82 82 104  Wt. (Lbs) 286 289 279 288 288 288 277  BMI 37.22 37.61 35.82 36.98 36.98 36.98 35.56

## 2020-09-17 NOTE — Assessment & Plan Note (Signed)
Patient educated about the importance of limiting  Carbohydrate intake , the need to commit to daily physical activity for a minimum of 30 minutes , and to commit weight loss. The fact that changes in all these areas will reduce or eliminate all together the development of diabetes is stressed.  Updated lab needed at/ before next visit.  Diabetic Labs Latest Ref Rng & Units 11/19/2019 05/29/2019 10/03/2018 10/01/2017 04/29/2017  HbA1c <5.7 % of total Hgb 6.4(H) 6.1(H) 6.1(H) - 6.0(H)  Microalbumin 0.00 - 1.89 mg/dL - - - - -  Micro/Creat Ratio 0.0 - 30.0 mg/g - - - - -  Chol <200 mg/dL 189 200(H) 199 - 185  HDL > OR = 40 mg/dL 45 45 45 - 48  Calc LDL mg/dL (calc) 124(H) 134(H) 134(H) - 116(H)  Triglycerides <150 mg/dL 102 99 98 - 103  Creatinine 0.70 - 1.18 mg/dL 1.12 1.30(H) 1.19(H) 1.18 1.15   BP/Weight 09/17/2020 08/04/2020 05/21/2020 03/26/2020 01/18/2020 11/19/2019 8/61/6837  Systolic BP 290 211 155 208 022 336 122  Diastolic BP 86 92 66 66 82 82 104  Wt. (Lbs) 286 289 279 288 288 288 277  BMI 37.22 37.61 35.82 36.98 36.98 36.98 35.56   Foot/eye exam completion dates 05/20/2016  Foot Form Completion Done

## 2020-10-28 ENCOUNTER — Other Ambulatory Visit: Payer: Self-pay | Admitting: Family Medicine

## 2020-11-13 ENCOUNTER — Other Ambulatory Visit: Payer: Self-pay | Admitting: Family Medicine

## 2020-11-13 DIAGNOSIS — I10 Essential (primary) hypertension: Secondary | ICD-10-CM

## 2020-11-18 ENCOUNTER — Encounter: Payer: Medicare HMO | Admitting: Family Medicine

## 2020-11-24 ENCOUNTER — Other Ambulatory Visit: Payer: Self-pay

## 2020-11-24 ENCOUNTER — Telehealth: Payer: Self-pay | Admitting: Family Medicine

## 2020-11-24 DIAGNOSIS — Z125 Encounter for screening for malignant neoplasm of prostate: Secondary | ICD-10-CM

## 2020-11-24 DIAGNOSIS — R7303 Prediabetes: Secondary | ICD-10-CM | POA: Diagnosis not present

## 2020-11-24 DIAGNOSIS — I10 Essential (primary) hypertension: Secondary | ICD-10-CM | POA: Diagnosis not present

## 2020-11-24 DIAGNOSIS — E785 Hyperlipidemia, unspecified: Secondary | ICD-10-CM | POA: Diagnosis not present

## 2020-11-24 DIAGNOSIS — R3912 Poor urinary stream: Secondary | ICD-10-CM

## 2020-11-24 DIAGNOSIS — E8881 Metabolic syndrome: Secondary | ICD-10-CM

## 2020-11-24 NOTE — Telephone Encounter (Signed)
error 

## 2020-11-25 ENCOUNTER — Other Ambulatory Visit: Payer: Self-pay

## 2020-11-25 ENCOUNTER — Encounter: Payer: Self-pay | Admitting: Family Medicine

## 2020-11-25 ENCOUNTER — Ambulatory Visit (INDEPENDENT_AMBULATORY_CARE_PROVIDER_SITE_OTHER): Payer: Medicare HMO | Admitting: Family Medicine

## 2020-11-25 VITALS — BP 150/96 | HR 84 | Resp 16 | Ht 74.0 in | Wt 284.0 lb

## 2020-11-25 DIAGNOSIS — R972 Elevated prostate specific antigen [PSA]: Secondary | ICD-10-CM

## 2020-11-25 DIAGNOSIS — N139 Obstructive and reflux uropathy, unspecified: Secondary | ICD-10-CM | POA: Diagnosis not present

## 2020-11-25 DIAGNOSIS — Z0001 Encounter for general adult medical examination with abnormal findings: Secondary | ICD-10-CM | POA: Diagnosis not present

## 2020-11-25 DIAGNOSIS — I10 Essential (primary) hypertension: Secondary | ICD-10-CM

## 2020-11-25 LAB — CMP14+EGFR
ALT: 17 IU/L (ref 0–44)
AST: 18 IU/L (ref 0–40)
Albumin/Globulin Ratio: 1.9 (ref 1.2–2.2)
Albumin: 4.4 g/dL (ref 3.7–4.7)
Alkaline Phosphatase: 114 IU/L (ref 44–121)
BUN/Creatinine Ratio: 12 (ref 10–24)
BUN: 15 mg/dL (ref 8–27)
Bilirubin Total: 0.5 mg/dL (ref 0.0–1.2)
CO2: 21 mmol/L (ref 20–29)
Calcium: 9.7 mg/dL (ref 8.6–10.2)
Chloride: 102 mmol/L (ref 96–106)
Creatinine, Ser: 1.26 mg/dL (ref 0.76–1.27)
GFR calc Af Amer: 65 mL/min/{1.73_m2} (ref >59)
GFR calc non Af Amer: 56 mL/min/{1.73_m2} — ABNORMAL LOW (ref >59)
Globulin, Total: 2.3 g/dL (ref 1.5–4.5)
Glucose: 121 mg/dL — ABNORMAL HIGH (ref 65–99)
Potassium: 4.6 mmol/L (ref 3.5–5.2)
Sodium: 139 mmol/L (ref 134–144)
Total Protein: 6.7 g/dL (ref 6.0–8.5)

## 2020-11-25 LAB — LIPID PANEL
Chol/HDL Ratio: 4.3 ratio (ref 0.0–5.0)
Cholesterol, Total: 204 mg/dL — ABNORMAL HIGH (ref 100–199)
HDL: 48 mg/dL (ref >39)
LDL Chol Calc (NIH): 138 mg/dL — ABNORMAL HIGH (ref 0–99)
Triglycerides: 100 mg/dL (ref 0–149)
VLDL Cholesterol Cal: 18 mg/dL (ref 5–40)

## 2020-11-25 LAB — CBC
Hematocrit: 42.1 % (ref 37.5–51.0)
Hemoglobin: 14 g/dL (ref 13.0–17.7)
MCH: 26.7 pg (ref 26.6–33.0)
MCHC: 33.3 g/dL (ref 31.5–35.7)
MCV: 80 fL (ref 79–97)
Platelets: 190 10*3/uL (ref 150–450)
RBC: 5.25 x10E6/uL (ref 4.14–5.80)
RDW: 14.8 % (ref 11.6–15.4)
WBC: 6.9 10*3/uL (ref 3.4–10.8)

## 2020-11-25 LAB — HEMOGLOBIN A1C
Est. average glucose Bld gHb Est-mCnc: 134 mg/dL
Hgb A1c MFr Bld: 6.3 % — ABNORMAL HIGH (ref 4.8–5.6)

## 2020-11-25 LAB — TSH: TSH: 1.28 u[IU]/mL (ref 0.450–4.500)

## 2020-11-25 LAB — PSA: Prostate Specific Ag, Serum: 16.1 ng/mL — ABNORMAL HIGH (ref 0.0–4.0)

## 2020-11-25 MED ORDER — CHLORTHALIDONE 25 MG PO TABS: 25.0000 mg | ORAL_TABLET | Freq: Every day | ORAL | 2 refills | Status: DC

## 2020-11-25 NOTE — Progress Notes (Signed)
   Carlos Leon     MRN: [id number]      DOB: 06/10/1946   HPI: Patient is in for annual physical exam. Uncontrolled blood presure is addressed. Recent labs,  are reviewed. Immunization is reviewed , and  Update is refused    PE; BP (!) 150/96   Pulse 84   Resp 16   Ht 6\' 2"  (1.88 m)   Wt 284 lb 0.6 oz (128.8 kg)   SpO2 99%   BMI 36.47 kg/m   Pleasant male, alert and oriented x 3, in no cardio-pulmonary distress. Afebrile. HEENT No facial trauma or asymetry. Sinuses non tender. EOMI External ears normal,  Neck: supple, no adenopathy,JVD or thyromegaly.No bruits.  Chest: Clear to ascultation bilaterally.No crackles or wheezes. Non tender to palpation  Cardiovascular system; Heart sounds normal,  S1 and  S2 ,no S3.  No murmur, or thrill.irrgular pulse Apical beat not displaced Peripheral pulses normal.  Abdomen: Soft, non tender, no organomegaly or masses. No bruits. Bowel sounds normal. No guarding, tenderness or rebound.    Musculoskeletal exam: Full ROM of spine, hips , shoulders an reduced in left  knee. No deformity ,swelling or crepitus noted. No muscle wasting or atrophy.   Neurologic: Cranial nerves 2 to 12 intact. Power, tone ,sensation and reflexes normal throughout. No disturbance in gait. No tremor.  Skin: Intact, no ulceration, erythema , scaling or rash noted. Pigmentation normal throughout  Psych; Normal mood and affect. Judgement and concentration normal   Assessment & Plan:  Encounter for Medicare annual examination with abnormal findings Annual exam as documented. Counseling done  re healthy lifestyle involving commitment to 150 minutes exercise per week, heart healthy diet, and attaining healthy weight.The importance of adequate sleep also discussed. Regular seat belt use and home safety, is also discussed. Changes in health habits are decided on by the patient with goals and time frames  set for achieving them. Immunization and  cancer screening needs are specifically addressed at this visit.   Essential hypertension, benign Uncontrolled, add chlorthalidone DASH diet and commitment to daily physical activity for a minimum of 30 minutes discussed and encouraged, as a part of hypertension management. The importance of attaining a healthy weight is also discussed.  BP/Weight 11/25/2020 09/17/2020 08/04/2020 05/21/2020 03/26/2020 01/18/2020 40/98/1191  Systolic BP 478 295 621 308 657 846 962  Diastolic BP 96 86 92 66 66 82 82  Wt. (Lbs) 284.04 286 289 279 288 288 288  BMI 36.47 37.22 37.61 35.82 36.98 36.98 36.98       Morbid obesity  Patient re-educated about  the importance of commitment to a  minimum of 150 minutes of exercise per week as able.  The importance of healthy food choices with portion control discussed, as well as eating regularly and within a 12 hour window most days. The need to choose "clean , green" food 50 to 75% of the time is discussed, as well as to make water the primary drink and set a goal of 64 ounces water daily.    Weight /BMI 11/25/2020 09/17/2020 08/04/2020  WEIGHT 284 lb 0.6 oz 286 lb 289 lb  HEIGHT 6\' 2"  6' 1.5" 6' 1.5"  BMI 36.47 kg/m2 37.22 kg/m2 37.61 kg/m2      Elevated PSA Continues to increase  Refer to urology, also has obstructive symptoms

## 2020-11-25 NOTE — Assessment & Plan Note (Signed)
Uncontrolled, add chlorthalidone DASH diet and commitment to daily physical activity for a minimum of 30 minutes discussed and encouraged, as a part of hypertension management. The importance of attaining a healthy weight is also discussed.  BP/Weight 11/25/2020 09/17/2020 08/04/2020 05/21/2020 03/26/2020 01/18/2020 02/58/5277  Systolic BP 824 235 361 443 154 008 676  Diastolic BP 96 86 92 66 66 82 82  Wt. (Lbs) 284.04 286 289 279 288 288 288  BMI 36.47 37.22 37.61 35.82 36.98 36.98 36.98

## 2020-11-25 NOTE — Assessment & Plan Note (Signed)
  Patient re-educated about  the importance of commitment to a  minimum of 150 minutes of exercise per week as able.  The importance of healthy food choices with portion control discussed, as well as eating regularly and within a 12 hour window most days. The need to choose "clean , green" food 50 to 75% of the time is discussed, as well as to make water the primary drink and set a goal of 64 ounces water daily.    Weight /BMI 11/25/2020 09/17/2020 08/04/2020  WEIGHT 284 lb 0.6 oz 286 lb 289 lb  HEIGHT 6\' 2"  6' 1.5" 6' 1.5"  BMI 36.47 kg/m2 37.22 kg/m2 37.61 kg/m2

## 2020-11-25 NOTE — Assessment & Plan Note (Signed)

## 2020-11-25 NOTE — Assessment & Plan Note (Signed)
Continues to increase  Refer to urology, also has obstructive symptoms

## 2020-11-25 NOTE — Patient Instructions (Addendum)
/  U in office 2nd week in January, re eval BP, please bring meds to visit  You are referred to Urology  New additional med for BP is chlorthalidone take one daily every morning with your other BP meds  Please document Lingle booster on Nov 04, 2020 (historic0   Please add iron and vit D to recent labs  If you decide on flu vaccine , please call us !    It is important that you exercise regularly at least 30 minutes 5 times a week. If you develop chest pain, have severe difficulty breathing, or feel very tired, stop exercising immediately and seek medical attention   Think about what you will eat, plan ahead. Choose " clean, green, fresh or frozen" over canned, processed or packaged foods which are more sugary, salty and fatty. 70 to 75% of food eaten should be vegetables and fruit. Three meals at set times with snacks allowed between meals, but they must be fruit or vegetables. Aim to eat over a 12 hour period , example 7 am to 7 pm, and STOP after  your last meal of the day. Drink water,generally about 64 ounces per day, no other drink is as healthy. Fruit juice is best enjoyed in a healthy way, by EATING the fruit. Thanks for choosing [facility], we consider it a privelige to serve you.

## 2021-01-05 ENCOUNTER — Ambulatory Visit: Payer: Medicare HMO | Admitting: Family Medicine

## 2021-01-06 ENCOUNTER — Other Ambulatory Visit: Payer: Self-pay

## 2021-01-06 ENCOUNTER — Ambulatory Visit (INDEPENDENT_AMBULATORY_CARE_PROVIDER_SITE_OTHER): Payer: Medicare HMO | Admitting: Nurse Practitioner

## 2021-01-06 ENCOUNTER — Encounter: Payer: Self-pay | Admitting: Nurse Practitioner

## 2021-01-06 VITALS — BP 151/90 | HR 82 | Temp 98.3°F | Resp 20 | Ht 74.0 in | Wt 281.0 lb

## 2021-01-06 DIAGNOSIS — I10 Essential (primary) hypertension: Secondary | ICD-10-CM

## 2021-01-06 DIAGNOSIS — D509 Iron deficiency anemia, unspecified: Secondary | ICD-10-CM

## 2021-01-06 DIAGNOSIS — E785 Hyperlipidemia, unspecified: Secondary | ICD-10-CM

## 2021-01-06 NOTE — Patient Instructions (Signed)
Your blood pressure is a little elevated today, but your home readings have been great.  I know that your medication has been administered at sporadic times, so lets try to get on more of a schedule. The chlorthalidone will make you urinate, so take that in the morning. The benazepril is fine to take in the evening.

## 2021-01-06 NOTE — Assessment & Plan Note (Signed)
-  BP elevated at 151/90 -his home BPs have been great mostly -don't want to add meds if his regular BP is 128/67 -he admits to taking his meds sporadically -will have him record BP at least twice per week and we will -check BP again in 1 month -he has not been taking amlodipine; would consider adding this back if BP is still elevated

## 2021-01-06 NOTE — Progress Notes (Signed)
Acute Office Visit  Subjective:    Patient ID: Carlos Leon, male    DOB: Feb 02, 1946, 75 y.o.   MRN: [id number]  Chief Complaint  Patient presents with  . Follow-up  . Hypertension    HPI Patient is in today for BP check. He was started on chlorthalidone and his HCTZ was stopped on 11/25/20.  He states he is no longer taking amlodipine.  His home readings at 0930 are in the 120s/60s. As the day progresses, his BP is elevated as high as 165/87.  He states he has been taking his medication sporadically and he just took his meds just prior to coming in this AM.  Past Medical History:  Diagnosis Date  . Arthritis   . Atrial fibrillation ([place])    Present by ECG 01/2012  . Colon polyps   . Diverticulosis of colon   . Educated about COVID-19 virus infection 05/25/2019  . 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 Essential hypertension, benign   . Gout   . Hyperlipidemia   . Metabolic syndrome X 02/05/4764  . Obesity     Past Surgical History:  Procedure Laterality Date  . Colon polyps removed  2002   Dr. Tamala Julian  . COLONOSCOPY  2004   Dr. Misty Stanley prep, ACBE showed diverticulosis  . COLONOSCOPY  11/24/2011   Procedure: COLONOSCOPY;  Surgeon: Daneil Dolin, MD;  Location: [facility];  Service: Endoscopy;  Laterality: N/A;  9:10  . COLONOSCOPY N/A 03/26/2020   Procedure: COLONOSCOPY;  Surgeon: Daneil Dolin, MD;  Location: [facility];  Service: Endoscopy;  Laterality: N/A;  9:30  . HERNIA REPAIR      Family History  Problem Relation Age of Onset  . Hypertension Mother   . Cancer Mother        Rectal, approximately 42  . Diabetes Father   . Liver disease Neg Hx   . Colon cancer Neg Hx     Social History   Socioeconomic History  . Marital status: Married    Spouse name: Not on file  . Number of children: 4  . Years of education: 3  . Highest education level: 12th grade  Occupational History  .  Occupation: Retired  Tobacco Use  . Smoking status: Former Smoker    Packs/day: 2.00    Years: 5.00    Pack years: 10.00    Types: Cigarettes  . Smokeless tobacco: Never Used  Vaping Use  . Vaping Use: Never used  Substance and Sexual Activity  . Alcohol use: No  . Drug use: No  . Sexual activity: Yes  Other Topics Concern  . Not on file  Social History Narrative   Lives with wife alone    Social Determinants of Health   Financial Resource Strain: Not on file  Food Insecurity: Not on file  Transportation Needs: Not on file  Physical Activity: Not on file  Stress: Not on file  Social Connections: Not on file  Intimate Partner Violence: Not on file    Outpatient Medications Prior to Visit  Medication Sig Dispense Refill  . aspirin EC 81 MG tablet Take 81 mg by mouth every other day.  31 tablet 11  . benazepril (LOTENSIN) 20 MG tablet TAKE 2 TABLETS ONE TIME DAILY 180 tablet 1  . chlorthalidone (HYGROTON) 25 MG tablet Take 1 tablet (25 mg total) by mouth daily. 30 tablet 2  . naproxen sodium (ALEVE)  220 MG tablet Take 440 mg by mouth 2 (two) times daily as needed (knee pain.).    Marland Kitchen amLODipine (NORVASC) 10 MG tablet TAKE 1 TABLET EVERY DAY (Patient not taking: Reported on 01/06/2021) 90 tablet 1  . Cholecalciferol (VITAMIN D3) 50 MCG (2000 UT) TABS Take 2,000 Units by mouth daily. (Patient not taking: Reported on 01/06/2021)    . ferrous sulfate 324 MG TBEC Take 324 mg by mouth daily. (Patient not taking: Reported on 01/06/2021)    . ketoconazole (NIZORAL) 2 % shampoo APPLY SHAMPOO TO AFFECTED AREAS  FOR 5 MINUTES EVERY DAY FOR 3 CONSECUTIVE DAYS. REPEAT AFTER 2 WEEKS (Patient not taking: Reported on 01/06/2021) 240 mL 0   No facility-administered medications prior to visit.    No Known Allergies  Review of Systems  Constitutional: Negative.   Respiratory: Negative.   Cardiovascular: Negative.        Objective:    Physical Exam Constitutional:      Appearance: Normal  appearance.  Cardiovascular:     Rate and Rhythm: Normal rate. Rhythm irregular.     Pulses: Normal pulses.     Heart sounds: Normal heart sounds.  Pulmonary:     Effort: Pulmonary effort is normal.     Breath sounds: Normal breath sounds.  Neurological:     Mental Status: He is alert.     BP (!) 151/90   Pulse 82   Temp 98.3 F (36.8 C)   Resp 20   Ht 6' 2"  (1.88 m)   Wt 281 lb (127.5 kg)   SpO2 98%   BMI 36.08 kg/m  Wt Readings from Last 3 Encounters:  01/06/21 281 lb (127.5 kg)  11/25/20 284 lb 0.6 oz (128.8 kg)  09/17/20 286 lb (129.7 kg)    Health Maintenance Due  Topic Date Due  . COVID-19 Vaccine (3 - Booster for Pfizer series) 08/20/2020    There are no preventive care reminders to display for this patient.   Lab Results  Component Value Date   TSH 1.280 11/24/2020   Lab Results  Component Value Date   WBC 6.9 11/24/2020   HGB 14.0 11/24/2020   HCT 42.1 11/24/2020   MCV 80 11/24/2020   PLT 190 11/24/2020   Lab Results  Component Value Date   NA 139 11/24/2020   K 4.6 11/24/2020   CO2 21 11/24/2020   GLUCOSE 121 (H) 11/24/2020   BUN 15 11/24/2020   CREATININE 1.26 11/24/2020   BILITOT 0.5 11/24/2020   ALKPHOS 114 11/24/2020   AST 18 11/24/2020   ALT 17 11/24/2020   PROT 6.7 11/24/2020   ALBUMIN 4.4 11/24/2020   CALCIUM 9.7 11/24/2020   Lab Results  Component Value Date   CHOL 204 (H) 11/24/2020   Lab Results  Component Value Date   HDL 48 11/24/2020   Lab Results  Component Value Date   LDLCALC 138 (H) 11/24/2020   Lab Results  Component Value Date   TRIG 100 11/24/2020   Lab Results  Component Value Date   CHOLHDL 4.3 11/24/2020   Lab Results  Component Value Date   HGBA1C 6.3 (H) 11/24/2020       Assessment & Plan:   Problem List Items Addressed This Visit      Cardiovascular and Mediastinum   Essential hypertension, benign    -BP elevated at 151/90 -his home BPs have been great mostly -don't want to add meds  if his regular BP is 128/67 -he admits to taking his  meds sporadically -will have him record BP at least twice per week and we will -check BP again in 1 month -he has not been taking amlodipine; would consider adding this back if BP is still elevated      Relevant Orders   CBC with Differential/Platelet   CMP14+EGFR     Other   Hyperlipemia - Primary   Relevant Orders   Lipid Panel With LDL/HDL Ratio   IDA (iron deficiency anemia)   Relevant Orders   Fe+TIBC+Fer       No orders of the defined types were placed in this encounter.    Noreene Larsson, NP

## 2021-01-07 ENCOUNTER — Ambulatory Visit: Payer: Medicare HMO | Admitting: Urology

## 2021-01-07 ENCOUNTER — Other Ambulatory Visit: Payer: Self-pay | Admitting: Family Medicine

## 2021-01-19 DIAGNOSIS — R7301 Impaired fasting glucose: Secondary | ICD-10-CM | POA: Diagnosis not present

## 2021-01-19 DIAGNOSIS — I10 Essential (primary) hypertension: Secondary | ICD-10-CM | POA: Diagnosis not present

## 2021-01-19 DIAGNOSIS — D509 Iron deficiency anemia, unspecified: Secondary | ICD-10-CM | POA: Diagnosis not present

## 2021-01-19 DIAGNOSIS — E785 Hyperlipidemia, unspecified: Secondary | ICD-10-CM | POA: Diagnosis not present

## 2021-01-20 ENCOUNTER — Encounter: Payer: Medicare HMO | Admitting: Family Medicine

## 2021-01-20 LAB — LIPID PANEL WITH LDL/HDL RATIO
Cholesterol, Total: 208 mg/dL — ABNORMAL HIGH (ref 100–199)
HDL: 45 mg/dL (ref >39)
LDL Chol Calc (NIH): 144 mg/dL — ABNORMAL HIGH (ref 0–99)
LDL/HDL Ratio: 3.2 ratio (ref 0.0–3.6)
Triglycerides: 108 mg/dL (ref 0–149)
VLDL Cholesterol Cal: 19 mg/dL (ref 5–40)

## 2021-01-20 LAB — CBC WITH DIFFERENTIAL/PLATELET
Basophils Absolute: 0 10*3/uL (ref 0.0–0.2)
Basos: 0 %
EOS (ABSOLUTE): 0.2 10*3/uL (ref 0.0–0.4)
Eos: 2 %
Hematocrit: 42.4 % (ref 37.5–51.0)
Hemoglobin: 14.4 g/dL (ref 13.0–17.7)
Immature Grans (Abs): 0.1 10*3/uL (ref 0.0–0.1)
Immature Granulocytes: 1 %
Lymphocytes Absolute: 1.7 10*3/uL (ref 0.7–3.1)
Lymphs: 18 %
MCH: 26.8 pg (ref 26.6–33.0)
MCHC: 34 g/dL (ref 31.5–35.7)
MCV: 79 fL (ref 79–97)
Monocytes Absolute: 0.6 10*3/uL (ref 0.1–0.9)
Monocytes: 6 %
Neutrophils Absolute: 6.9 10*3/uL (ref 1.4–7.0)
Neutrophils: 73 %
Platelets: 204 10*3/uL (ref 150–450)
RBC: 5.38 x10E6/uL (ref 4.14–5.80)
RDW: 13.6 % (ref 11.6–15.4)
WBC: 9.4 10*3/uL (ref 3.4–10.8)

## 2021-01-20 LAB — CMP14+EGFR
ALT: 21 IU/L (ref 0–44)
AST: 21 IU/L (ref 0–40)
Albumin/Globulin Ratio: 1.6 (ref 1.2–2.2)
Albumin: 4.1 g/dL (ref 3.7–4.7)
Alkaline Phosphatase: 107 IU/L (ref 44–121)
BUN/Creatinine Ratio: 14 (ref 10–24)
BUN: 23 mg/dL (ref 8–27)
Bilirubin Total: 0.6 mg/dL (ref 0.0–1.2)
CO2: 26 mmol/L (ref 20–29)
Calcium: 9.8 mg/dL (ref 8.6–10.2)
Chloride: 103 mmol/L (ref 96–106)
Creatinine, Ser: 1.63 mg/dL — ABNORMAL HIGH (ref 0.76–1.27)
GFR calc Af Amer: 47 mL/min/{1.73_m2} — ABNORMAL LOW (ref >59)
GFR calc non Af Amer: 41 mL/min/{1.73_m2} — ABNORMAL LOW (ref >59)
Globulin, Total: 2.6 g/dL (ref 1.5–4.5)
Glucose: 138 mg/dL — ABNORMAL HIGH (ref 65–99)
Potassium: 4.5 mmol/L (ref 3.5–5.2)
Sodium: 142 mmol/L (ref 134–144)
Total Protein: 6.7 g/dL (ref 6.0–8.5)

## 2021-01-20 LAB — IRON,TIBC AND FERRITIN PANEL
Ferritin: 269 ng/mL (ref 30–400)
Iron Saturation: 19 % (ref 15–55)
Iron: 47 ug/dL (ref 38–169)
Total Iron Binding Capacity: 247 ug/dL — ABNORMAL LOW (ref 250–450)
UIBC: 200 ug/dL (ref 111–343)

## 2021-01-20 NOTE — Progress Notes (Signed)
Added A1c, will call when those results come back

## 2021-01-21 ENCOUNTER — Ambulatory Visit (INDEPENDENT_AMBULATORY_CARE_PROVIDER_SITE_OTHER): Payer: Medicare HMO

## 2021-01-21 ENCOUNTER — Other Ambulatory Visit: Payer: Self-pay

## 2021-01-21 VITALS — Ht 74.0 in | Wt 281.0 lb

## 2021-01-21 DIAGNOSIS — Z0000 Encounter for general adult medical examination without abnormal findings: Secondary | ICD-10-CM

## 2021-01-21 NOTE — Patient Instructions (Signed)
Carlos Leon , Thank you for taking time to come for your Medicare Wellness Visit. I appreciate your ongoing commitment to your health goals. Please review the following plan we discussed and let me know if I can assist you in the future.   Screening recommendations/referrals: Colonoscopy: due 2031 Recommended yearly ophthalmology/optometry visit for glaucoma screening and checkup Recommended yearly dental visit for hygiene and checkup  Vaccinations: Influenza vaccine: up to date Pneumococcal vaccine: up to date Tdap vaccine: up to date Shingles vaccine: can get at the pharmacy    Next appointment: annual wellness visit in 1 year  Preventive Care 48 Years and Older, Male Preventive care refers to lifestyle choices and visits with your health care provider that can promote health and wellness. What does preventive care include?  A yearly physical exam. This is also called an annual well check.  Dental exams once or twice a year.  Routine eye exams. Ask your health care provider how often you should have your eyes checked.  Personal lifestyle choices, including:  Daily care of your teeth and gums.  Regular physical activity.  Eating a healthy diet.  Avoiding tobacco and drug use.  Limiting alcohol use.  Practicing safe sex.  Taking low doses of aspirin every day.  Taking vitamin and mineral supplements as recommended by your health care provider. What happens during an annual well check? The services and screenings done by your health care provider during your annual well check will depend on your age, overall health, lifestyle risk factors, and family history of disease. Counseling  Your health care provider may ask you questions about your:  Alcohol use.  Tobacco use.  Drug use.  Emotional well-being.  Home and relationship well-being.  Sexual activity.  Eating habits.  History of falls.  Memory and ability to understand (cognition).  Work and work  Statistician. Screening  You may have the following tests or measurements:  Height, weight, and BMI.  Blood pressure.  Lipid and cholesterol levels. These may be checked every 5 years, or more frequently if you are over 73 years old.  Skin check.  Lung cancer screening. You may have this screening every year starting at age 36 if you have a 30-pack-year history of smoking and currently smoke or have quit within the past 15 years.  Fecal occult blood test (FOBT) of the stool. You may have this test every year starting at age 2.  Flexible sigmoidoscopy or colonoscopy. You may have a sigmoidoscopy every 5 years or a colonoscopy every 10 years starting at age 71.  Prostate cancer screening. Recommendations will vary depending on your family history and other risks.  Hepatitis C blood test.  Hepatitis B blood test.  Sexually transmitted disease (STD) testing.  Diabetes screening. This is done by checking your blood sugar (glucose) after you have not eaten for a while (fasting). You may have this done every 1-3 years.  Abdominal aortic aneurysm (AAA) screening. You may need this if you are a current or former smoker.  Osteoporosis. You may be screened starting at age 28 if you are at high risk. Talk with your health care provider about your test results, treatment options, and if necessary, the need for more tests. Vaccines  Your health care provider may recommend certain vaccines, such as:  Influenza vaccine. This is recommended every year.  Tetanus, diphtheria, and acellular pertussis (Tdap, Td) vaccine. You may need a Td booster every 10 years.  Zoster vaccine. You may need this after age 24.  Pneumococcal 13-valent conjugate (PCV13) vaccine. One dose is recommended after age 17.  Pneumococcal polysaccharide (PPSV23) vaccine. One dose is recommended after age 109. Talk to your health care provider about which screenings and vaccines you need and how often you need them. This  information is not intended to replace advice given to you by your health care provider. Make sure you discuss any questions you have with your health care provider. Document Released: 01/09/2016 Document Revised: 09/01/2016 Document Reviewed: 10/14/2015 Elsevier Interactive Patient Education  2017 [employer] Prevention in the Home Falls can cause injuries. They can happen to people of all ages. There are many things you can do to make your home safe and to help prevent falls. What can I do on the outside of my home?  Regularly fix the edges of walkways and driveways and fix any cracks.  Remove anything that might make you trip as you walk through a door, such as a raised step or threshold.  Trim any bushes or trees on the path to your home.  Use bright outdoor lighting.  Clear any walking paths of anything that might make someone trip, such as rocks or tools.  Regularly check to see if handrails are loose or broken. Make sure that both sides of any steps have handrails.  Any raised decks and porches should have guardrails on the edges.  Have any leaves, snow, or ice cleared regularly.  Use sand or salt on walking paths during winter.  Clean up any spills in your garage right away. This includes oil or grease spills. What can I do in the bathroom?  Use night lights.  Install grab bars by the toilet and in the tub and shower. Do not use towel bars as grab bars.  Use non-skid mats or decals in the tub or shower.  If you need to sit down in the shower, use a plastic, non-slip stool.  Keep the floor dry. Clean up any water that spills on the floor as soon as it happens.  Remove soap buildup in the tub or shower regularly.  Attach bath mats securely with double-sided non-slip rug tape.  Do not have throw rugs and other things on the floor that can make you trip. What can I do in the bedroom?  Use night lights.  Make sure that you have a light by your bed that is  easy to reach.  Do not use any sheets or blankets that are too big for your bed. They should not hang down onto the floor.  Have a firm chair that has side arms. You can use this for support while you get dressed.  Do not have throw rugs and other things on the floor that can make you trip. What can I do in the kitchen?  Clean up any spills right away.  Avoid walking on wet floors.  Keep items that you use a lot in easy-to-reach places.  If you need to reach something above you, use a strong step stool that has a grab bar.  Keep electrical cords out of the way.  Do not use floor polish or wax that makes floors slippery. If you must use wax, use non-skid floor wax.  Do not have throw rugs and other things on the floor that can make you trip. What can I do with my stairs?  Do not leave any items on the stairs.  Make sure that there are handrails on both sides of the stairs and use them.  Fix handrails that are broken or loose. Make sure that handrails are as long as the stairways.  Check any carpeting to make sure that it is firmly attached to the stairs. Fix any carpet that is loose or worn.  Avoid having throw rugs at the top or bottom of the stairs. If you do have throw rugs, attach them to the floor with carpet tape.  Make sure that you have a light switch at the top of the stairs and the bottom of the stairs. If you do not have them, ask someone to add them for you. What else can I do to help prevent falls?  Wear shoes that:  Do not have high heels.  Have rubber bottoms.  Are comfortable and fit you well.  Are closed at the toe. Do not wear sandals.  If you use a stepladder:  Make sure that it is fully opened. Do not climb a closed stepladder.  Make sure that both sides of the stepladder are locked into place.  Ask someone to hold it for you, if possible.  Clearly mark and make sure that you can see:  Any grab bars or handrails.  First and last  steps.  Where the edge of each step is.  Use tools that help you move around (mobility aids) if they are needed. These include:  Canes.  Walkers.  Scooters.  Crutches.  Turn on the lights when you go into a dark area. Replace any light bulbs as soon as they burn out.  Set up your furniture so you have a clear path. Avoid moving your furniture around.  If any of your floors are uneven, fix them.  If there are any pets around you, be aware of where they are.  Review your medicines with your doctor. Some medicines can make you feel dizzy. This can increase your chance of falling. Ask your doctor what other things that you can do to help prevent falls. This information is not intended to replace advice given to you by your health care provider. Make sure you discuss any questions you have with your health care provider. Document Released: 10/09/2009 Document Revised: 05/20/2016 Document Reviewed: 01/17/2015 Elsevier Interactive Patient Education  2017 [employer].

## 2021-01-21 NOTE — Progress Notes (Signed)
Subjective:   Carlos Leon is a 75 y.o. male who presents for Medicare Annual/Subsequent preventive examination.  Review of Systems     Cardiac Risk Factors include: dyslipidemia;hypertension;obesity (BMI >30kg/m2);smoking/ tobacco exposure;advanced age (>91men, >5 women)     Objective:    Today's Vitals   01/21/21 0827  PainSc: 0-No pain   There is no height or weight on file to calculate BMI.  Advanced Directives 03/26/2020 09/15/2019 01/15/2019 01/02/2018 08/20/2014 03/18/2014 11/24/2011  Does Patient Have a Medical Advance Directive? No No No No No Patient does not have advance directive;Patient would like information Patient does not have advance directive;Patient would like information  Would patient like information on creating a medical advance directive? Yes (ED - Information included in AVS) No - Patient declined Yes (ED - Information included in AVS) Yes (MAU/Ambulatory/Procedural Areas - Information given) Yes - Educational materials given Advance directive packet given Advance directive packet given  Pre-existing out of facility DNR order (yellow form or pink MOST form) - - - - - - No    Current Medications (verified) Outpatient Encounter Medications as of 01/21/2021  Medication Sig  . amLODipine (NORVASC) 10 MG tablet TAKE 1 TABLET EVERY DAY  . aspirin EC 81 MG tablet Take 81 mg by mouth every other day.   . benazepril (LOTENSIN) 20 MG tablet TAKE 2 TABLETS ONE TIME DAILY  . chlorthalidone (HYGROTON) 25 MG tablet Take 1 tablet (25 mg total) by mouth daily.  . Cholecalciferol (VITAMIN D3) 50 MCG (2000 UT) TABS Take 2,000 Units by mouth daily.  . ferrous sulfate 324 MG TBEC Take 324 mg by mouth daily.  Marland Kitchen ketoconazole (NIZORAL) 2 % shampoo APPLY SHAMPOO TO AFFECTED AREAS FOR 5 MINUTES EVERY DAY FOR 3 CONSECUTIVE DAYS. REPEAT AFTER 2 WEEKS  . naproxen sodium (ALEVE) 220 MG tablet Take 440 mg by mouth 2 (two) times daily as needed (knee pain.).   No facility-administered  encounter medications on file as of 01/21/2021.    Allergies (verified) Patient has no known allergies.   History: Past Medical History:  Diagnosis Date  . Arthritis   . Atrial fibrillation ([place])    Present by ECG 01/2012  . Colon polyps   . Diverticulosis of colon   . Educated about COVID-19 virus infection 05/25/2019  . 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 Essential hypertension, benign   . Gout   . Hyperlipidemia   . Metabolic syndrome X [id number]  . Obesity    Past Surgical History:  Procedure Laterality Date  . Colon polyps removed  2002   Dr. Tamala Julian  . COLONOSCOPY  2004   Dr. Misty Stanley prep, ACBE showed diverticulosis  . COLONOSCOPY  11/24/2011   Procedure: COLONOSCOPY;  Surgeon: Daneil Dolin, MD;  [facility];  Service: Endoscopy;  Laterality: N/A;  9:10  . COLONOSCOPY N/A 03/26/2020   Procedure: COLONOSCOPY;  Surgeon: Daneil Dolin, MD;  [facility];  Service: Endoscopy;  Laterality: N/A;  9:30  . HERNIA REPAIR     Family History  Problem Relation Age of Onset  . Hypertension Mother   . Cancer Mother        Rectal, approximately 77  . Diabetes Father   . Liver disease Neg Hx   . Colon cancer Neg Hx    Social History   Socioeconomic History  . Marital status: Married    Spouse name: Not on file  .  Number of children: 4  . Years of education: 50  . Highest education level: 12th grade  Occupational History  . Occupation: Retired  Tobacco Use  . Smoking status: Former Smoker    Packs/day: 2.00    Years: 5.00    Pack years: 10.00    Types: Cigarettes  . Smokeless tobacco: Never Used  Vaping Use  . Vaping Use: Never used  Substance and Sexual Activity  . Alcohol use: No  . Drug use: No  . Sexual activity: Yes  Other Topics Concern  . Not on file  Social History Narrative   Lives with wife alone    Social Determinants of Health   Financial Resource Strain: Not  on file  Food Insecurity: Not on file  Transportation Needs: No Transportation Needs  . Lack of Transportation (Medical): No  . Lack of Transportation (Non-Medical): No  Physical Activity: Sufficiently Active  . Days of Exercise per Week: 5 days  . Minutes of Exercise per Session: 30 min  Stress: No Stress Concern Present  . Feeling of Stress : Not at all  Social Connections: Moderately Integrated  . Frequency of Communication with Friends and Family: More than three times a week  . Frequency of Social Gatherings with Friends and Family: More than three times a week  . Attends Religious Services: More than 4 times per year  . Active Member of Clubs or Organizations: No  . Attends Archivist Meetings: Never  . Marital Status: Married    Tobacco Counseling Counseling given: Not Answered   Clinical Intake:  Pre-visit preparation completed: Yes  Pain : No/denies pain Pain Score: 0-No pain     Nutritional Status: BMI > 30  Obese Diabetes: No  How often do you need to have someone help you when you read instructions, pamphlets, or other written materials from your doctor or pharmacy?: 1 - Never  Diabetic? no  Interpreter Needed?: No      Activities of Daily Living In your present state of health, do you have any difficulty performing the following activities: 01/21/2021  Hearing? N  Vision? N  Difficulty concentrating or making decisions? N  Walking or climbing stairs? Y  Dressing or bathing? N  Preparing Food and eating ? N  Using the Toilet? N  In the past six months, have you accidently leaked urine? N  Do you have problems with loss of bowel control? N  Managing your Medications? N  Managing your Finances? N  Housekeeping or managing your Housekeeping? N  Some recent data might be hidden    Patient Care Team: [name], MD as PCP - General Rourk, Cristopher Estimable, MD as Consulting Physician (Gastroenterology)  Indicate any recent Medical  Services you may have received from other than [facility] providers in the past year (date may be approximate).     Assessment:   This is a routine wellness examination for Carlos Leon.  Hearing/Vision screen No exam data present  Dietary issues and exercise activities discussed: Current Exercise Habits: The patient does not participate in regular exercise at present  Goals    . DIET - EAT MORE FRUITS AND VEGETABLES    . Weight (lb) < 200 lb (90.7 kg)     Wants to lose about 20 lbs       Depression Screen PHQ 2/9 Scores 01/21/2021 01/06/2021 11/25/2020 01/18/2020 05/24/2019 01/15/2019 01/15/2019  PHQ - 2 Score 0 0 0 0 0 0 0  PHQ- 9 Score - - - - -  0 0    Fall Risk Fall Risk  01/21/2021 01/06/2021 11/25/2020 09/17/2020 01/18/2020  Falls in the past year? 0 0 0 0 0  Comment - - - - -  Number falls in past yr: 0 0 - 0 0  Injury with Fall? 0 0 - 0 0  Risk for fall due to : - No Fall Risks - - -  Risk for fall due to: Comment - - - - -  Follow up - Falls evaluation completed - - -    FALL RISK PREVENTION PERTAINING TO THE HOME:  Any stairs in or around the home? No  If so, are there any without handrails? No  Home free of loose throw rugs in walkways, pet beds, electrical cords, etc? Yes  Adequate lighting in your home to reduce risk of falls? Yes   ASSISTIVE DEVICES UTILIZED TO PREVENT FALLS:  Life alert? No  Use of a cane, walker or w/c? No  Grab bars in the bathroom? Yes  Shower chair or bench in shower? No  Elevated toilet seat or a handicapped toilet? No   TIMED UP AND GO:  Was the test performed? No .  Length of time to ambulate 10 feet:      Cognitive Function:     6CIT Screen 01/18/2020 01/15/2019 01/02/2018  What Year? 0 points 0 points 0 points  What month? 0 points 0 points 0 points  What time? 0 points 0 points 0 points  Count back from 20 0 points 0 points 0 points  Months in reverse 0 points 0 points 0 points  Repeat phrase 0 points 0 points 2 points  Total Score 0  0 2    Immunizations Immunization History  Administered Date(s) Administered  . PFIZER(Purple Top)SARS-COV-2 Vaccination 01/31/2020, 02/21/2020, 10/31/2020  . Pneumococcal Conjugate-13 08/20/2014  . Pneumococcal Polysaccharide-23 02/07/2012  . Tdap 02/07/2012    TDAP status: Up to date  Flu Vaccine status: Up to date  Pneumococcal vaccine status: Up to date  Covid-19 vaccine status: Completed vaccines  Qualifies for Shingles Vaccine? Yes   Zostavax completed No   Shingrix Completed?: No.    Education has been provided regarding the importance of this vaccine. Patient has been advised to call insurance company to determine out of pocket expense if they have not yet received this vaccine. Advised may also receive vaccine at local pharmacy or Health Dept. Verbalized acceptance and understanding.  Screening Tests Health Maintenance  Topic Date Due  . INFLUENZA VACCINE  03/26/2021 (Originally 07/27/2020)  . HEMOGLOBIN A1C  05/24/2021  . TETANUS/TDAP  02/06/2022  . COLONOSCOPY (Pts 45-6yrs Insurance coverage will need to be confirmed)  03/26/2030  . COVID-19 Vaccine  Completed  . Hepatitis C Screening  Completed  . PNA vac Low Risk Adult  Completed    Health Maintenance  There are no preventive care reminders to display for this patient.  Colorectal cancer screening: Type of screening: Colonoscopy. Completed yes. Repeat every 10 years  Lung Cancer Screening: (Low Dose CT Chest recommended if Age 60-80 years, 30 pack-year currently smoking OR have quit w/in 15years.) does not qualify.   Lung Cancer Screening Referral: no  Additional Screening:  Hepatitis C Screening: does qualify; Completed yes  Vision Screening: Recommended annual ophthalmology exams for early detection of glaucoma and other disorders of the eye. Is the patient up to date with their annual eye exam?  Yes  Who is the provider or what is the name of the office in which  the patient attends annual eye exams?  [facility] If pt is not established with a provider, would they like to be referred to a provider to establish care? No .   Dental Screening: Recommended annual dental exams for proper oral hygiene  Community Resource Referral / Chronic Care Management: CRR required this visit?  No   CCM required this visit?  No      Plan:     I have personally reviewed and noted the following in the patient's chart:   . Medical and social history . Use of alcohol, tobacco or illicit drugs  . Current medications and supplements . Functional ability and status . Nutritional status . Physical activity . Advanced directives . List of other physicians . Hospitalizations, surgeries, and ER visits in previous 12 months . Vitals . Screenings to include cognitive, depression, and falls . Referrals and appointments  In addition, I have reviewed and discussed with patient certain preventive protocols, quality metrics, and best practice recommendations. A written personalized care plan for preventive services as well as general preventive health recommendations were provided to patient.     Kate Sable, LPN, LPN   [id number]   Nurse Notes: Visit performed over the telephone. Patient at home, provider in the office. Pt consented to telephone visit. Time spent with patient 30 mins.

## 2021-01-22 LAB — HEMOGLOBIN A1C
Est. average glucose Bld gHb Est-mCnc: 137 mg/dL
Hgb A1c MFr Bld: 6.4 % — ABNORMAL HIGH (ref 4.8–5.6)

## 2021-01-22 LAB — SPECIMEN STATUS REPORT

## 2021-01-22 NOTE — Progress Notes (Signed)
A1c is 6.4 and this is stable for him. No reason to add or change meds.

## 2021-02-06 ENCOUNTER — Ambulatory Visit (INDEPENDENT_AMBULATORY_CARE_PROVIDER_SITE_OTHER): Payer: Medicare HMO | Admitting: Nurse Practitioner

## 2021-02-06 ENCOUNTER — Other Ambulatory Visit: Payer: Self-pay

## 2021-02-06 ENCOUNTER — Encounter: Payer: Self-pay | Admitting: Nurse Practitioner

## 2021-02-06 DIAGNOSIS — I10 Essential (primary) hypertension: Secondary | ICD-10-CM

## 2021-02-06 DIAGNOSIS — I4891 Unspecified atrial fibrillation: Secondary | ICD-10-CM | POA: Diagnosis not present

## 2021-02-06 NOTE — Progress Notes (Signed)
Acute Office Visit  Subjective:    Patient ID: Carlos Leon, male    DOB: [birth year], 75 y.o.   MRN: [id number]  Chief Complaint  Patient presents with  . Hypertension    Follow up    HPI Patient is in today for BP check. At last OV on 01/06/21, his BP was 151/91, and he had not been taking amlodipine. He stated that his home BPs had been great at that time, so we made no changes to his BP meds. He also stated that he had been taking his medication sporadically. He was to keep a log of his blood pressure readings from home, and he state his home readings have been in the 120-130s/70-80s  BP is 144/78, but he states he just took his medication prior to coming into the office.  Past Medical History:  Diagnosis Date  . Arthritis   . Atrial fibrillation ([place])    Present by ECG 01/2012  . Colon polyps   . Diverticulosis of colon   . Educated about COVID-19 virus infection 05/25/2019  . 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 Essential hypertension, benign   . Gout   . Hyperlipidemia   . Metabolic syndrome X 07/04/2955  . Obesity     Past Surgical History:  Procedure Laterality Date  . Colon polyps removed  2002   Dr. Tamala Julian  . COLONOSCOPY  2004   Dr. Misty Stanley prep, ACBE showed diverticulosis  . COLONOSCOPY  11/24/2011   Procedure: COLONOSCOPY;  Surgeon: Daneil Dolin, MD;  [facility];  Service: Endoscopy;  Laterality: N/A;  9:10  . COLONOSCOPY N/A 03/26/2020   Procedure: COLONOSCOPY;  Surgeon: Daneil Dolin, MD;  [facility];  Service: Endoscopy;  Laterality: N/A;  9:30  . HERNIA REPAIR      Family History  Problem Relation Age of Onset  . Hypertension Mother   . Cancer Mother        Rectal, approximately 85  . Diabetes Father   . Liver disease Neg Hx   . Colon cancer Neg Hx     Social History   Socioeconomic History  . Marital status: Married    Spouse name: Not on file  .  Number of children: 4  . Years of education: 39  . Highest education level: 12th grade  Occupational History  . Occupation: Retired  Tobacco Use  . Smoking status: Former Smoker    Packs/day: 2.00    Years: 5.00    Pack years: 10.00    Types: Cigarettes  . Smokeless tobacco: Never Used  Vaping Use  . Vaping Use: Never used  Substance and Sexual Activity  . Alcohol use: No  . Drug use: No  . Sexual activity: Yes  Other Topics Concern  . Not on file  Social History Narrative   Lives with wife alone    Social Determinants of Health   Financial Resource Strain: Not on file  Food Insecurity: Not on file  Transportation Needs: No Transportation Needs  . Lack of Transportation (Medical): No  . Lack of Transportation (Non-Medical): No  Physical Activity: Sufficiently Active  . Days of Exercise per Week: 5 days  . Minutes of Exercise per Session: 30 min  Stress: No Stress Concern Present  . Feeling of Stress : Not at all  Social Connections: Moderately Integrated  . Frequency of Communication with Friends and Family: More than three times  a week  . Frequency of Social Gatherings with Friends and Family: More than three times a week  . Attends Religious Services: More than 4 times per year  . Active Member of Clubs or Organizations: No  . Attends Archivist Meetings: Never  . Marital Status: Married  Human resources officer Violence: Not on file    Outpatient Medications Prior to Visit  Medication Sig Dispense Refill  . amLODipine (NORVASC) 10 MG tablet TAKE 1 TABLET EVERY DAY 90 tablet 1  . aspirin EC 81 MG tablet Take 81 mg by mouth every other day.  31 tablet 11  . benazepril (LOTENSIN) 20 MG tablet TAKE 2 TABLETS ONE TIME DAILY 180 tablet 1  . chlorthalidone (HYGROTON) 25 MG tablet Take 1 tablet (25 mg total) by mouth daily. 30 tablet 2  . Cholecalciferol (VITAMIN D3) 50 MCG (2000 UT) TABS Take 2,000 Units by mouth daily.    . ferrous sulfate 324 MG TBEC Take 324 mg  by mouth daily.    Marland Kitchen ketoconazole (NIZORAL) 2 % shampoo APPLY SHAMPOO TO AFFECTED AREAS FOR 5 MINUTES EVERY DAY FOR 3 CONSECUTIVE DAYS. REPEAT AFTER 2 WEEKS 240 mL 0  . naproxen sodium (ALEVE) 220 MG tablet Take 440 mg by mouth 2 (two) times daily as needed (knee pain.).     No facility-administered medications prior to visit.    No Known Allergies  Review of Systems  Constitutional: Negative for fatigue and fever.  Respiratory: Negative for cough, shortness of breath and wheezing.   Cardiovascular: Negative for chest pain and palpitations.  Gastrointestinal: Negative for abdominal pain.  Psychiatric/Behavioral: Negative for suicidal ideas. The patient is not nervous/anxious.        Objective:    Physical Exam Constitutional:      General: He is not in acute distress.    Appearance: Normal appearance.  Cardiovascular:     Rate and Rhythm: Normal rate. Rhythm irregular.     Pulses: Normal pulses.     Heart sounds: Normal heart sounds.  Pulmonary:     Effort: Pulmonary effort is normal.     Breath sounds: Normal breath sounds.  Neurological:     General: No focal deficit present.     Mental Status: He is alert and oriented to person, place, and time.  Psychiatric:        Mood and Affect: Mood normal.        Behavior: Behavior normal.        Thought Content: Thought content normal.        Judgment: Judgment normal.     BP (!) 144/78 (BP Location: Right Arm, Patient Position: Sitting, Cuff Size: Normal)   Pulse 84   Temp 98.1 F (36.7 C) (Oral)   Ht 6\' 1"  (1.854 m)   Wt 280 lb (127 kg)   SpO2 97%   BMI 36.94 kg/m  Wt Readings from Last 3 Encounters:  02/06/21 280 lb (127 kg)  01/21/21 281 lb (127.5 kg)  01/06/21 281 lb (127.5 kg)    There are no preventive care reminders to display for this patient.  There are no preventive care reminders to display for this patient.   Lab Results  Component Value Date   TSH 1.280 11/24/2020   Lab Results  Component  Value Date   WBC 9.4 01/19/2021   HGB 14.4 01/19/2021   HCT 42.4 01/19/2021   MCV 79 01/19/2021   PLT 204 01/19/2021   Lab Results  Component Value Date  NA 142 01/19/2021   K 4.5 01/19/2021   CO2 26 01/19/2021   GLUCOSE 138 (H) 01/19/2021   BUN 23 01/19/2021   CREATININE 1.63 (H) 01/19/2021   BILITOT 0.6 01/19/2021   ALKPHOS 107 01/19/2021   AST 21 01/19/2021   ALT 21 01/19/2021   PROT 6.7 01/19/2021   ALBUMIN 4.1 01/19/2021   CALCIUM 9.8 01/19/2021   Lab Results  Component Value Date   CHOL 208 (H) 01/19/2021   Lab Results  Component Value Date   HDL 45 01/19/2021   Lab Results  Component Value Date   LDLCALC 144 (H) 01/19/2021   Lab Results  Component Value Date   TRIG 108 01/19/2021   Lab Results  Component Value Date   CHOLHDL 4.3 11/24/2020   Lab Results  Component Value Date   HGBA1C 6.4 (H) 01/19/2021       Assessment & Plan:   Problem List Items Addressed This Visit      Cardiovascular and Mediastinum   Essential hypertension, benign    -BP elevated at last OV -has been taking chlorthalidone and amlodipine, but he was taking amlodipine sporadically -he has been taking BP meds without issues and BP is well controlled at home      Atrial fibrillation ([place])    -asymptomatic today -rate controlled without medication -no current anticoagulation -we discussed increased risk of strove without anticoagulation -he would like to see cardiology before starting this -refer to cardiology      Relevant Orders   Ambulatory referral to Cardiology       No orders of the defined types were placed in this encounter.    Noreene Larsson, NP

## 2021-02-06 NOTE — Assessment & Plan Note (Addendum)
-  BP elevated at last OV -has been taking chlorthalidone and amlodipine, but he was taking amlodipine sporadically -he has been taking BP meds without issues and BP is well controlled at home

## 2021-02-06 NOTE — Assessment & Plan Note (Addendum)
-  asymptomatic today -rate controlled without medication -no current anticoagulation -we discussed increased risk of strove without anticoagulation -he would like to see cardiology before starting this -refer to cardiology

## 2021-02-17 NOTE — Progress Notes (Signed)
Subjective: 1. Elevated PSA   2. BPH with urinary obstruction   3. Nocturia   4. Urinary frequency   5. Urgency of urination      Carlos Leon is a 75 yo male who is sent by Dr. Moshe Cipro for an elevated PSA that was 12.8 on 11/19/19 and 16.1 on 11/24/20.   I saw him in 2015 for a PSA of 9.28 and recommended a biopsy.  I had seen him in 2013 as well with a PSA of 7.0 and recommended a biopsy.  His PSA in 2012 was 5.99.  He reports that he had a biopsy done at [facility] about 5 years ago.  The biopsy was negative.  He is voiding with moderate LUTS and an IPSS of 18/2.   He has nocturia x 3 and some frequency and urgency.  He has had no hematuria or dysuria.    ROS:  ROS  No Known Allergies  Past Medical History:  Diagnosis Date  . Arthritis   . Atrial fibrillation ([place])    Present by ECG 01/2012  . Colon polyps   . Diverticulosis of colon   . Educated about COVID-19 virus infection 05/25/2019  . 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 Essential hypertension, benign   . Gout   . Hyperlipidemia   . Metabolic syndrome X 07/31/1659  . Obesity     Past Surgical History:  Procedure Laterality Date  . Colon polyps removed  2002   Dr. Tamala Julian  . COLONOSCOPY  2004   Dr. Misty Stanley prep, ACBE showed diverticulosis  . COLONOSCOPY  11/24/2011   Procedure: COLONOSCOPY;  Surgeon: Daneil Dolin, MD;  [facility];  Service: Endoscopy;  Laterality: N/A;  9:10  . COLONOSCOPY N/A 03/26/2020   Procedure: COLONOSCOPY;  Surgeon: Daneil Dolin, MD;  [facility];  Service: Endoscopy;  Laterality: N/A;  9:30  . HERNIA REPAIR    . PROSTATE BIOPSY      Social History   Socioeconomic History  . Marital status: Married    Spouse name: Not on file  . Number of children: 4  . Years of education: 56  . Highest education level: 12th grade  Occupational History  . Occupation: Retired  Tobacco Use  . Smoking status:  Former Smoker    Packs/day: 2.00    Years: 5.00    Pack years: 10.00    Types: Cigarettes  . Smokeless tobacco: Never Used  Vaping Use  . Vaping Use: Never used  Substance and Sexual Activity  . Alcohol use: No  . Drug use: No  . Sexual activity: Yes  Other Topics Concern  . Not on file  Social History Narrative   Lives with wife alone    Social Determinants of Health   Financial Resource Strain: Not on file  Food Insecurity: Not on file  Transportation Needs: No Transportation Needs  . Lack of Transportation (Medical): No  . Lack of Transportation (Non-Medical): No  Physical Activity: Sufficiently Active  . Days of Exercise per Week: 5 days  . Minutes of Exercise per Session: 30 min  Stress: No Stress Concern Present  . Feeling of Stress : Not at all  Social Connections: Moderately Integrated  . Frequency of Communication with Friends and Family: More than three times a week  . Frequency of Social Gatherings with Friends and Family: More than three times a week  . Attends Religious Services: More  than 4 times per year  . Active Member of Clubs or Organizations: No  . Attends Archivist Meetings: Never  . Marital Status: Married  Human resources officer Violence: Not on file    Family History  Problem Relation Age of Onset  . Hypertension Mother   . Cancer Mother        Rectal, approximately 37  . Diabetes Father   . Alzheimer's disease Father   . Liver disease Neg Hx   . Colon cancer Neg Hx     Anti-infectives: Anti-infectives (From admission, onward)   None      Current Outpatient Medications  Medication Sig Dispense Refill  . amLODipine (NORVASC) 10 MG tablet TAKE 1 TABLET EVERY DAY 90 tablet 1  . aspirin EC 81 MG tablet Take 81 mg by mouth every other day.  31 tablet 11  . benazepril (LOTENSIN) 20 MG tablet TAKE 2 TABLETS ONE TIME DAILY 180 tablet 1  . chlorthalidone (HYGROTON) 25 MG tablet Take 1 tablet (25 mg total) by mouth daily. 30 tablet 2  .  Cholecalciferol (VITAMIN D3) 50 MCG (2000 UT) TABS Take 2,000 Units by mouth daily.    . ferrous sulfate 324 MG TBEC Take 324 mg by mouth daily.    Marland Kitchen ketoconazole (NIZORAL) 2 % shampoo APPLY SHAMPOO TO AFFECTED AREAS FOR 5 MINUTES EVERY DAY FOR 3 CONSECUTIVE DAYS. REPEAT AFTER 2 WEEKS 240 mL 0  . naproxen sodium (ALEVE) 220 MG tablet Take 440 mg by mouth 2 (two) times daily as needed (knee pain.).     No current facility-administered medications for this visit.     Objective: Vital signs in last 24 hours: BP (!) 154/97   Pulse (!) 101   Temp 98.3 F (36.8 C)   Ht 6' 1"  (1.854 m)   Wt 280 lb (127 kg)   BMI 36.94 kg/m   Intake/Output from previous day: No intake/output data recorded. Intake/Output this shift: @IOTHISSHIFT @   Physical Exam  Lab Results:  Results for orders placed or performed in visit on 02/19/21 (from the past 24 hour(s))  Urinalysis, Routine w reflex microscopic     Status: Abnormal   Collection Time: 02/19/21  2:36 PM  Result Value Ref Range   Specific Gravity, UA 1.015 1.005 - 1.030   pH, UA 5.5 5.0 - 7.5   Color, UA Yellow Yellow   Appearance Ur Clear Clear   Leukocytes,UA Negative Negative   Protein,UA 2+ (A) Negative/Trace   Glucose, UA Negative Negative   Ketones, UA Negative Negative   RBC, UA Negative Negative   Bilirubin, UA Negative Negative   Urobilinogen, Ur 0.2 0.2 - 1.0 mg/dL   Nitrite, UA Negative Negative   Microscopic Examination See below:    Narrative   Performed at:  [facility] [address] [place], [place]  [id number] Lab Director: Mina Marble MT, Phone:  [phone]  Microscopic Examination     Status: None   Collection Time: 02/19/21  2:36 PM   Urine  Result Value Ref Range   WBC, UA None seen 0 - 5 /hpf   RBC None seen 0 - 2 /hpf   Epithelial Cells (non renal) None seen 0 - 10 /hpf   Renal Epithel, UA None seen None seen /hpf   Mucus, UA Present Not Estab.   Bacteria, UA None seen None seen/Few    Narrative   Performed at:  [facility] [address] [place], [place]  [id number] Lab  Director: Mina Marble MT, Phone:  [phone]    BMET Recent Results (from the past 2160 hour(s))  Lipid panel     Status: Abnormal   Collection Time: 11/24/20  9:50 AM  Result Value Ref Range   Cholesterol, Total 204 (H) 100 - 199 mg/dL   Triglycerides 100 0 - 149 mg/dL   HDL 48 >39 mg/dL   VLDL Cholesterol Cal 18 5 - 40 mg/dL   LDL Chol Calc (NIH) 138 (H) 0 - 99 mg/dL   Chol/HDL Ratio 4.3 0.0 - 5.0 ratio    Comment:                                   T. Chol/HDL Ratio                                             Men  Women                               1/2 Avg.Risk  3.4    3.3                                   Avg.Risk  5.0    4.4                                2X Avg.Risk  9.6    7.1                                3X Avg.Risk 23.4   11.0   Hemoglobin A1c     Status: Abnormal   Collection Time: 11/24/20  9:50 AM  Result Value Ref Range   Hgb A1c MFr Bld 6.3 (H) 4.8 - 5.6 %    Comment:          Prediabetes: 5.7 - 6.4          Diabetes: >6.4          Glycemic control for adults with diabetes: <7.0    Est. average glucose Bld gHb Est-mCnc 134 mg/dL  PSA     Status: Abnormal   Collection Time: 11/24/20  9:50 AM  Result Value Ref Range   Prostate Specific Ag, Serum 16.1 (H) 0.0 - 4.0 ng/mL    Comment: Roche ECLIA methodology. According to the American Urological Association, Serum PSA should decrease and remain at undetectable levels after radical prostatectomy. The AUA defines biochemical recurrence as an initial PSA value 0.2 ng/mL or greater followed by a subsequent confirmatory PSA value 0.2 ng/mL or greater. Values obtained with different assay methods or kits cannot be used interchangeably. Results cannot be interpreted as absolute evidence of the presence or absence of malignant disease.   CBC     Status: None   Collection Time: 11/24/20  9:50 AM  Result Value Ref  Range   WBC 6.9 3.4 - 10.8 x10E3/uL   RBC 5.25 4.14 - 5.80 x10E6/uL   Hemoglobin 14.0 13.0 - 17.7 g/dL   Hematocrit 42.1 37.5 - 51.0 %   MCV 80 79 - 97  fL   MCH 26.7 26.6 - 33.0 pg   MCHC 33.3 31.5 - 35.7 g/dL   RDW 14.8 11.6 - 15.4 %   Platelets 190 150 - 450 x10E3/uL  CMP14+EGFR     Status: Abnormal   Collection Time: 11/24/20  9:50 AM  Result Value Ref Range   Glucose 121 (H) 65 - 99 mg/dL   BUN 15 8 - 27 mg/dL   Creatinine, Ser 1.26 0.76 - 1.27 mg/dL   GFR calc non Af Amer 56 (L) >59 mL/min/1.73   GFR calc Af Amer 65 >59 mL/min/1.73    Comment: **In accordance with recommendations from the NKF-ASN Task force,**   [facility] is in the process of updating its eGFR calculation to the   2021 CKD-EPI creatinine equation that estimates kidney function   without a race variable.    BUN/Creatinine Ratio 12 10 - 24   Sodium 139 134 - 144 mmol/L   Potassium 4.6 3.5 - 5.2 mmol/L   Chloride 102 96 - 106 mmol/L   CO2 21 20 - 29 mmol/L   Calcium 9.7 8.6 - 10.2 mg/dL   Total Protein 6.7 6.0 - 8.5 g/dL   Albumin 4.4 3.7 - 4.7 g/dL   Globulin, Total 2.3 1.5 - 4.5 g/dL   Albumin/Globulin Ratio 1.9 1.2 - 2.2   Bilirubin Total 0.5 0.0 - 1.2 mg/dL   Alkaline Phosphatase 114 44 - 121 IU/L    Comment:               **Please note reference interval change**   AST 18 0 - 40 IU/L   ALT 17 0 - 44 IU/L  TSH     Status: None   Collection Time: 11/24/20  9:50 AM  Result Value Ref Range   TSH 1.280 0.450 - 4.500 uIU/mL  CBC with Differential/Platelet     Status: None   Collection Time: 01/19/21  9:29 AM  Result Value Ref Range   WBC 9.4 3.4 - 10.8 x10E3/uL   RBC 5.38 4.14 - 5.80 x10E6/uL   Hemoglobin 14.4 13.0 - 17.7 g/dL   Hematocrit 42.4 37.5 - 51.0 %   MCV 79 79 - 97 fL   MCH 26.8 26.6 - 33.0 pg   MCHC 34.0 31.5 - 35.7 g/dL   RDW 13.6 11.6 - 15.4 %   Platelets 204 150 - 450 x10E3/uL   Neutrophils 73 Not Estab. %   Lymphs 18 Not Estab. %   Monocytes 6 Not Estab. %   Eos 2 Not Estab. %    Basos 0 Not Estab. %   Neutrophils Absolute 6.9 1.4 - 7.0 x10E3/uL   Lymphocytes Absolute 1.7 0.7 - 3.1 x10E3/uL   Monocytes Absolute 0.6 0.1 - 0.9 x10E3/uL   EOS (ABSOLUTE) 0.2 0.0 - 0.4 x10E3/uL   Basophils Absolute 0.0 0.0 - 0.2 x10E3/uL   Immature Granulocytes 1 Not Estab. %   Immature Grans (Abs) 0.1 0.0 - 0.1 x10E3/uL  CMP14+EGFR     Status: Abnormal   Collection Time: 01/19/21  9:29 AM  Result Value Ref Range   Glucose 138 (H) 65 - 99 mg/dL   BUN 23 8 - 27 mg/dL   Creatinine, Ser 1.63 (H) 0.76 - 1.27 mg/dL   GFR calc non Af Amer 41 (L) >59 mL/min/1.73   GFR calc Af Amer 47 (L) >59 mL/min/1.73    Comment: **In accordance with recommendations from the NKF-ASN Task force,**   [facility] is in the process of updating its eGFR calculation  to the   2021 CKD-EPI creatinine equation that estimates kidney function   without a race variable.    BUN/Creatinine Ratio 14 10 - 24   Sodium 142 134 - 144 mmol/L   Potassium 4.5 3.5 - 5.2 mmol/L   Chloride 103 96 - 106 mmol/L   CO2 26 20 - 29 mmol/L   Calcium 9.8 8.6 - 10.2 mg/dL   Total Protein 6.7 6.0 - 8.5 g/dL   Albumin 4.1 3.7 - 4.7 g/dL   Globulin, Total 2.6 1.5 - 4.5 g/dL   Albumin/Globulin Ratio 1.6 1.2 - 2.2   Bilirubin Total 0.6 0.0 - 1.2 mg/dL   Alkaline Phosphatase 107 44 - 121 IU/L   AST 21 0 - 40 IU/L   ALT 21 0 - 44 IU/L  Lipid Panel With LDL/HDL Ratio     Status: Abnormal   Collection Time: 01/19/21  9:29 AM  Result Value Ref Range   Cholesterol, Total 208 (H) 100 - 199 mg/dL   Triglycerides 108 0 - 149 mg/dL   HDL 45 >39 mg/dL   VLDL Cholesterol Cal 19 5 - 40 mg/dL   LDL Chol Calc (NIH) 144 (H) 0 - 99 mg/dL   LDL/HDL Ratio 3.2 0.0 - 3.6 ratio    Comment:                                     LDL/HDL Ratio                                             Men  Women                               1/2 Avg.Risk  1.0    1.5                                   Avg.Risk  3.6    3.2                                2X Avg.Risk  6.2     5.0                                3X Avg.Risk  8.0    6.1   Fe+TIBC+Fer     Status: Abnormal   Collection Time: 01/19/21  9:29 AM  Result Value Ref Range   Total Iron Binding Capacity 247 (L) 250 - 450 ug/dL   UIBC 200 111 - 343 ug/dL   Iron 47 38 - 169 ug/dL   Iron Saturation 19 15 - 55 %   Ferritin 269 30 - 400 ng/mL  Hemoglobin A1c     Status: Abnormal   Collection Time: 01/19/21  9:29 AM  Result Value Ref Range   Hgb A1c MFr Bld 6.4 (H) 4.8 - 5.6 %    Comment:          Prediabetes: 5.7 - 6.4          Diabetes: >6.4  Glycemic control for adults with diabetes: <7.0    Est. average glucose Bld gHb Est-mCnc 137 mg/dL  Specimen status report     Status: None   Collection Time: 01/19/21  9:29 AM  Result Value Ref Range   specimen status report Comment     Comment: Written Authorization Written Authorization Written Authorization Received. Authorization received from [facility] 01-21-2021 Logged by Pecolia Ades   Urinalysis, Routine w reflex microscopic     Status: Abnormal   Collection Time: 02/19/21  2:36 PM  Result Value Ref Range   Specific Gravity, UA 1.015 1.005 - 1.030   pH, UA 5.5 5.0 - 7.5   Color, UA Yellow Yellow   Appearance Ur Clear Clear   Leukocytes,UA Negative Negative   Protein,UA 2+ (A) Negative/Trace   Glucose, UA Negative Negative   Ketones, UA Negative Negative   RBC, UA Negative Negative   Bilirubin, UA Negative Negative   Urobilinogen, Ur 0.2 0.2 - 1.0 mg/dL   Nitrite, UA Negative Negative   Microscopic Examination See below:   Microscopic Examination     Status: None   Collection Time: 02/19/21  2:36 PM   Urine  Result Value Ref Range   WBC, UA None seen 0 - 5 /hpf   RBC None seen 0 - 2 /hpf   Epithelial Cells (non renal) None seen 0 - 10 /hpf   Renal Epithel, UA None seen None seen /hpf   Mucus, UA Present Not Estab.   Bacteria, UA None seen None seen/Few    Studies/Results: Prior office records from 2015 reviewed.     Assessment/Plan: He has a rising PSA of 16 with a negative prior biopsy.   I will try to get the biopsy records from [facility] and get him set up for a prostate MRI.  Depending on the results of the MRI, he will need either a standard or MR fusion biopsy.   BPH with BOO.  He has moderate LUTS.  We can discuss therapy once the elevated PSA is assassed.   No orders of the defined types were placed in this encounter.    Orders Placed This Encounter  Procedures  . Microscopic Examination  . MR PROSTATE W WO CONTRAST    Standing Status:   Future    Standing Expiration Date:   03/19/2021    Order Specific Question:   If indicated for the ordered procedure, I authorize the administration of contrast media per Radiology protocol    Answer:   Yes    Order Specific Question:   What is the patient's sedation requirement?    Answer:   No Sedation    Order Specific Question:   Does the patient have a pacemaker or implanted devices?    Answer:   No    Order Specific Question:   Radiology Contrast Protocol - do NOT remove file path    Answer:   [email or web address]    Order Specific Question:   Preferred imaging location?    Answer:   [facility] (table limit-350 lbs)  . Urinalysis, Routine w reflex microscopic     Return for I will call with the MRI results and arrange follow up accordingly. .    CC: Dr. Tula Nakayama.      Irine Seal 02/19/2021 [phone]

## 2021-02-19 ENCOUNTER — Encounter: Payer: Self-pay | Admitting: Urology

## 2021-02-19 ENCOUNTER — Ambulatory Visit (INDEPENDENT_AMBULATORY_CARE_PROVIDER_SITE_OTHER): Payer: Medicare HMO | Admitting: Urology

## 2021-02-19 VITALS — BP 154/97 | HR 101 | Temp 98.3°F | Ht 73.0 in | Wt 280.0 lb

## 2021-02-19 DIAGNOSIS — R351 Nocturia: Secondary | ICD-10-CM

## 2021-02-19 DIAGNOSIS — R972 Elevated prostate specific antigen [PSA]: Secondary | ICD-10-CM

## 2021-02-19 DIAGNOSIS — R3915 Urgency of urination: Secondary | ICD-10-CM | POA: Diagnosis not present

## 2021-02-19 DIAGNOSIS — N401 Enlarged prostate with lower urinary tract symptoms: Secondary | ICD-10-CM | POA: Diagnosis not present

## 2021-02-19 DIAGNOSIS — R350 Frequency of micturition: Secondary | ICD-10-CM | POA: Diagnosis not present

## 2021-02-19 DIAGNOSIS — N138 Other obstructive and reflux uropathy: Secondary | ICD-10-CM | POA: Diagnosis not present

## 2021-02-19 LAB — MICROSCOPIC EXAMINATION
Bacteria, UA: NONE SEEN
Epithelial Cells (non renal): NONE SEEN /hpf (ref 0–10)
RBC, Urine: NONE SEEN /hpf (ref 0–2)
Renal Epithel, UA: NONE SEEN /hpf
WBC, UA: NONE SEEN /hpf (ref 0–5)

## 2021-02-19 LAB — URINALYSIS, ROUTINE W REFLEX MICROSCOPIC
Bilirubin, UA: NEGATIVE
Glucose, UA: NEGATIVE
Ketones, UA: NEGATIVE
Leukocytes,UA: NEGATIVE
Nitrite, UA: NEGATIVE
RBC, UA: NEGATIVE
Specific Gravity, UA: 1.015 (ref 1.005–1.030)
Urobilinogen, Ur: 0.2 mg/dL (ref 0.2–1.0)
pH, UA: 5.5 (ref 5.0–7.5)

## 2021-02-19 NOTE — Progress Notes (Signed)
Urological Symptom Review  Patient is experiencing the following symptoms: Hard to postpone urination Get up at night to urinate Stream starts and stops Have to strain to urinate Weak stream   Review of Systems  Gastrointestinal (upper)  : Negative for upper GI symptoms  Gastrointestinal (lower) : Negative for lower GI symptoms  Constitutional : Negative for symptoms  Skin: Negative for skin symptoms  Eyes: Negative for eye symptoms  Ear/Nose/Throat : Negative for Ear/Nose/Throat symptoms  Hematologic/Lymphatic: Negative for Hematologic/Lymphatic symptoms  Cardiovascular : Negative for cardiovascular symptoms  Respiratory : Negative for respiratory symptoms  Endocrine: Negative for endocrine symptoms  Musculoskeletal: Joint pain  Neurological: Negative for neurological symptoms  Psychologic: Negative for psychiatric symptoms

## 2021-02-20 ENCOUNTER — Telehealth: Payer: Self-pay | Admitting: Urology

## 2021-02-20 NOTE — Telephone Encounter (Signed)
Pt stopped by the office today and let me know that he wants to cancel his MRI Prostate. Pt doesn't want to see Dr. Jeffie Pollock anymore.

## 2021-02-20 NOTE — Telephone Encounter (Signed)
I called to speak with patient- no answer. Left message to return call

## 2021-02-24 NOTE — Telephone Encounter (Signed)
Please see note.

## 2021-02-25 ENCOUNTER — Other Ambulatory Visit: Payer: Self-pay

## 2021-02-25 ENCOUNTER — Ambulatory Visit: Payer: Medicare HMO | Admitting: Urology

## 2021-02-25 DIAGNOSIS — I10 Essential (primary) hypertension: Secondary | ICD-10-CM

## 2021-02-25 MED ORDER — CHLORTHALIDONE 25 MG PO TABS: 25.0000 mg | ORAL_TABLET | Freq: Every day | ORAL | 2 refills | Status: DC

## 2021-02-25 MED ORDER — BENAZEPRIL HCL 20 MG PO TABS: ORAL_TABLET | ORAL | 1 refills | Status: DC

## 2021-02-25 NOTE — Telephone Encounter (Signed)
Please let Dr. Moshe Cipro know.  If he wants to see Dr. Keturah Barre or Dr. Jerilynn Mages, that is fine with me.

## 2021-02-25 NOTE — Telephone Encounter (Signed)
Please see below.

## 2021-03-02 NOTE — Telephone Encounter (Signed)
Hi Dr. Moshe Cipro,  [name] does not wish to see Dr. Jeffie Pollock any longer. I tried to reach out to him to schedule with another provided and had no success. Dr. Jeffie Pollock wanted you to be aware that patient is not being seen currently by anyone with urology.  Thanks, Producer, television/film/video

## 2021-03-02 NOTE — Telephone Encounter (Signed)
Thanks, Noted. 

## 2021-03-05 NOTE — Addendum Note (Signed)
Addended by: Dorisann Frames on: 03/05/2021 01:35 PM   Modules accepted: Orders

## 2021-04-01 ENCOUNTER — Ambulatory Visit: Payer: Medicare HMO | Admitting: Urology

## 2021-05-03 ENCOUNTER — Encounter: Payer: Self-pay | Admitting: Cardiology

## 2021-05-03 NOTE — Progress Notes (Signed)
Cardiology Office Note  Date: 05/04/2021   ID: Carlos Leon, DOB 08-14-1946, MRN [id number]  PCP:  Carlos Helper, MD  Cardiologist:  Carlos Lesches, MD Electrophysiologist:  None   Chief Complaint  Patient presents with  . Follow-up atrial fibrillation    History of Present Illness: Carlos Leon is a 75 y.o. male referred for cardiology consultation by [name] NP for the evaluation of atrial fibrillation.  He was seen by our practice in the past, last in 2014 at which point atrial fibrillation was also present.  During evaluation at that time his CHA2DS2-VASc score was 3 and anticoagulation was discussed, however he declined.  He had apparently been on Coumadin prior to that.  He presents today reporting no new symptomatology.  He is not aware of his atrial fibrillation, no sense of palpitations.  I personally reviewed his ECG today which shows rate controlled atrial fibrillation with leftward axis and nonspecific ST-T changes.  We talked again about initiation of anticoagulation, DOAC instead of aspirin which he has taken long-term.  He still does not want to pursue anticoagulation.  At the present time he is not on any AV nodal blockers for heart rate control.  Past Medical History:  Diagnosis Date  . Arthritis   . Atrial fibrillation ([place])    Present by ECG 01/2012  . Colon polyps   . Diverticulosis of colon   . Erectile dysfunction   . Essential hypertension   . Gout   . Hyperlipidemia   . Metabolic syndrome X 12/30/4313  . Obesity     Past Surgical History:  Procedure Laterality Date  . Colon polyps removed  2002   Carlos Leon  . COLONOSCOPY  2004   Carlos Leon prep, ACBE showed diverticulosis  . COLONOSCOPY  11/24/2011   Procedure: COLONOSCOPY;  Surgeon: Carlos Dolin, MD;  [facility];  Service: Endoscopy;  Laterality: N/A;  9:10  . COLONOSCOPY N/A 03/26/2020   Procedure: COLONOSCOPY;  Surgeon: Carlos Dolin, MD;  [facility];  Service: Endoscopy;  Laterality: N/A;  9:30  . HERNIA REPAIR    . PROSTATE BIOPSY      Current Outpatient Medications  Medication Sig Dispense Refill  . benazepril (LOTENSIN) 20 MG tablet TAKE 2 TABLETS ONE TIME DAILY 180 tablet 1  . chlorthalidone (HYGROTON) 25 MG tablet Take 1 tablet (25 mg total) by mouth daily. 30 tablet 2  . Cholecalciferol (VITAMIN D3) 50 MCG (2000 UT) TABS Take 2,000 Units by mouth daily.    . ferrous sulfate 324 MG TBEC Take 324 mg by mouth daily.    . naproxen sodium (ALEVE) 220 MG tablet Take 440 mg by mouth 2 (two) times daily as needed (knee pain.).     No current facility-administered medications for this visit.   Allergies:  Patient has no known allergies.   Social History: The patient  reports that he has quit smoking. His smoking use included cigarettes. He has a 10.00 pack-year smoking history. He has never used smokeless tobacco. He reports that he does not drink alcohol and does not use drugs.   Family History: The patient's family history includes Alzheimer's disease in his father; Cancer in his mother; Diabetes in his father; Hypertension in his mother.   ROS: No dizziness or syncope.  Physical Exam: VS:  BP (!) 142/80 (BP Location: Right Arm)   Pulse 97   Ht 6\' 2"  (1.88 m)   Wt 278 lb (126.1 kg)  SpO2 99%   BMI 35.69 kg/m , BMI Body mass index is 35.69 kg/m.  Wt Readings from Last 3 Encounters:  05/04/21 278 lb (126.1 kg)  02/19/21 280 lb (127 kg)  02/06/21 280 lb (127 kg)    General: Patient appears comfortable at rest. HEENT: Conjunctiva and lids normal, wearing a mask. Neck: Supple, no elevated JVP or carotid bruits, no thyromegaly. Lungs: Clear to auscultation, nonlabored breathing at rest. Cardiac: Irregularly irregular, no S3 or significant systolic murmur. Abdomen: Soft, nontender, bowel sounds present. Extremities: No pitting edema, distal pulses 2+. Skin: Warm and dry. Musculoskeletal: No  kyphosis. Neuropsychiatric: Alert and oriented x3, affect grossly appropriate.  ECG:  An ECG dated 07/09/2013 was personally reviewed today and demonstrated:  Rate controlled atrial fibrillation with nonspecific ST-T changes.  Recent Labwork: 11/24/2020: TSH 1.280 01/19/2021: ALT 21; AST 21; BUN 23; Creatinine, Ser 1.63; Hemoglobin 14.4; Platelets 204; Potassium 4.5; Sodium 142     Component Value Date/Time   CHOL 208 (H) 01/19/2021 0929   TRIG 108 01/19/2021 0929   HDL 45 01/19/2021 0929   CHOLHDL 4.3 11/24/2020 0950   CHOLHDL 4.2 11/19/2019 1020   VLDL 21 04/29/2017 0814   LDLCALC 144 (H) 01/19/2021 0929   LDLCALC 124 (H) 11/19/2019 1020   LDLDIRECT 125 05/20/2016 1645    Other Studies Reviewed Today:  Echocardiogram 08/14/2013: - Left ventricle: The cavity size was normal. There was mild  concentric hypertrophy. Systolic function was normal. The  estimated ejection fraction was in the range of 55% to  60%. Wall motion was normal; there were no regional wall  motion abnormalities. The study is not technically  sufficient to allow evaluation of LV diastolic function.  - Aortic valve: Trivial regurgitation.  - Mitral valve: Mildly thickened leaflets . Mild  regurgitation.  - Left atrium: The atrium was moderately to severely  dilated.  - Right atrium: The atrium was moderately dilated. Central  venous pressure: 69mm Hg (est).  - Tricuspid valve: Mild regurgitation.  - Pulmonary arteries: Systolic pressure could not be  accurately estimated.  - Pericardium, extracardiac: There was no pericardial  effusion.   Impressions:   - No prior study for comparison. Mild LVH with LVEF 55-60%.  Indeterminate diastolic function. Moderate to severe left  atrial enlargement, mild mitral regurgitation. Moderate  right atrial enlargement Normal RV function with mild  tricuspid regurgitation. Unable to assess PASP, CVP is  normal.   Assessment and  Plan:  1.  Permanent atrial fibrillation with CHA2DS2-VASc score of 3.  Patient declines anticoagulation following repeat discussion today.  He remains comfortable staying on aspirin daily, currently not requiring an AV nodal blocker for heart rate control.  ECG reviewed.  Keep follow-up with PCP and we will see him back in 1 year.  If he does reconsider regarding anticoagulation, would suggest Eliquis instead of aspirin.  2.  Essential hypertension, on Lotensin and chlorthalidone.  Keep follow-up with PCP.  Medication Adjustments/Labs and Tests Ordered: Current medicines are reviewed at length with the patient today.  Concerns regarding medicines are outlined above.   Tests Ordered: Orders Placed This Encounter  Procedures  . EKG 12-Lead    Medication Changes: No orders of the defined types were placed in this encounter.   Disposition:  Follow up 1 year.  Signed, Satira Sark, MD, [facility] 05/04/2021 1:49 PM    [facility] at [facility] [address], [place], [place] [address] Phone: [phone]; Fax: [phone]

## 2021-05-04 ENCOUNTER — Other Ambulatory Visit: Payer: Self-pay

## 2021-05-04 ENCOUNTER — Encounter: Payer: Self-pay | Admitting: Cardiology

## 2021-05-04 ENCOUNTER — Ambulatory Visit: Payer: Medicare HMO | Admitting: Cardiology

## 2021-05-04 VITALS — BP 142/80 | HR 97 | Ht 74.0 in | Wt 278.0 lb

## 2021-05-04 DIAGNOSIS — I4821 Permanent atrial fibrillation: Secondary | ICD-10-CM | POA: Diagnosis not present

## 2021-05-04 DIAGNOSIS — I10 Essential (primary) hypertension: Secondary | ICD-10-CM

## 2021-05-04 NOTE — Patient Instructions (Signed)
Medication Instructions:   Your physician recommends that you continue on your current medications as directed. Please refer to the Current Medication list given to you today.  *If you need a refill on your cardiac medications before your next appointment, please call your pharmacy*   Lab Work: None today  If you have labs (blood work) drawn today and your tests are completely normal, you will receive your results only by: . MyChart Message (if you have MyChart) OR . A paper copy in the mail If you have any lab test that is abnormal or we need to change your treatment, we will call you to review the results.   Testing/Procedures: None today   Follow-Up: At [facility], you and your health needs are our priority.  As part of our continuing mission to provide you with exceptional heart care, we have created designated Provider Care Teams.  These Care Teams include your primary Cardiologist (physician) and Advanced Practice Providers (APPs -  Physician Assistants and Nurse Practitioners) who all work together to provide you with the care you need, when you need it.  We recommend signing up for the patient portal called "MyChart".  Sign up information is provided on this After Visit Summary.  MyChart is used to connect with patients for Virtual Visits (Telemedicine).  Patients are able to view lab/test results, encounter notes, upcoming appointments, etc.  Non-urgent messages can be sent to your provider as well.   To learn more about what you can do with MyChart, go to [email or web address].    Your next appointment:   12 month(s)  The format for your next appointment:   In Person  Provider:   Samuel McDowell, MD   Other Instructions None   

## 2021-05-27 ENCOUNTER — Other Ambulatory Visit: Payer: Self-pay | Admitting: Family Medicine

## 2021-06-30 ENCOUNTER — Telehealth: Payer: Self-pay

## 2021-06-30 ENCOUNTER — Ambulatory Visit (INDEPENDENT_AMBULATORY_CARE_PROVIDER_SITE_OTHER): Payer: Medicare HMO | Admitting: Family Medicine

## 2021-06-30 ENCOUNTER — Encounter: Payer: Self-pay | Admitting: Family Medicine

## 2021-06-30 ENCOUNTER — Other Ambulatory Visit: Payer: Self-pay

## 2021-06-30 DIAGNOSIS — M79672 Pain in left foot: Secondary | ICD-10-CM | POA: Diagnosis not present

## 2021-06-30 MED ORDER — PREDNISONE 10 MG PO TABS: 10.0000 mg | ORAL_TABLET | Freq: Two times a day (BID) | ORAL | 0 refills | Status: DC

## 2021-06-30 NOTE — Telephone Encounter (Signed)
Left great toe pain x 4 days extending to foot, swollen , tender, warm and red, has had similar episode 5 days prednisone dent to pharmacy, willneed fasting lipid, cmp and EGfr, will enter in visit

## 2021-06-30 NOTE — Progress Notes (Signed)
Virtual Visit via Telephone Note  I connected with Carlos Leon on 06/30/21 at  1:40 PM EDT by telephone and verified that I am speaking with the correct person using two identifiers.  Location: Patient: home  Provider: office   I discussed the limitations, risks, security and privacy concerns of performing an evaluation and management service by telephone and the availability of in person appointments. I also discussed with the patient that there may be a patient responsible charge related to this service. The patient expressed understanding and agreed to proceed.   History of Present Illness: 4 day h/o acute left great toe pain extending to the foot. No trauma, has had gout in the past No other concerns, wants treatment   Observations/Objective: There were no vitals taken for this visit. Good communication with no confusion and intact memory. Alert and oriented x 3 No signs of respiratory distress during speech   Assessment and Plan: Acute foot pain, left Acute gout , prednisone x 5 days, update labs and start allopurinol once pain has improved   Follow Up Instructions:    I discussed the assessment and treatment plan with the patient. The patient was provided an opportunity to ask questions and all were answered. The patient agreed with the plan and demonstrated an understanding of the instructions.   The patient was advised to call back or seek an in-person evaluation if the symptoms worsen or if the condition fails to improve as anticipated.  I provided 8 minutes of non-face-to-face time during this encounter.   Tula Nakayama, MD

## 2021-06-30 NOTE — Patient Instructions (Addendum)
F/U next week as before, call if you need me sooner  Short course of prednisone is prescribed for acute toe and foot pain, presumed due to gout  Please get this Friday, fasting lipid, cmp and EGFr, HBA1C and Vit D and uric acid level  Thanks for choosing [facility], we consider it a privelige to serve you.    

## 2021-06-30 NOTE — Assessment & Plan Note (Signed)
Acute gout , prednisone x 5 days, update labs and start allopurinol once pain has improved

## 2021-06-30 NOTE — Telephone Encounter (Signed)
Gout Flare up on LEFT FOOT , no appt, pt needs refill on alpurinol   [facility] in [place]

## 2021-07-03 ENCOUNTER — Other Ambulatory Visit: Payer: Self-pay

## 2021-07-03 DIAGNOSIS — E785 Hyperlipidemia, unspecified: Secondary | ICD-10-CM

## 2021-07-03 DIAGNOSIS — E559 Vitamin D deficiency, unspecified: Secondary | ICD-10-CM

## 2021-07-03 DIAGNOSIS — I4821 Permanent atrial fibrillation: Secondary | ICD-10-CM

## 2021-07-03 DIAGNOSIS — D509 Iron deficiency anemia, unspecified: Secondary | ICD-10-CM

## 2021-07-03 DIAGNOSIS — M109 Gout, unspecified: Secondary | ICD-10-CM

## 2021-07-03 DIAGNOSIS — R7303 Prediabetes: Secondary | ICD-10-CM

## 2021-07-06 ENCOUNTER — Ambulatory Visit: Payer: Medicare HMO | Admitting: Family Medicine

## 2021-07-06 DIAGNOSIS — R7303 Prediabetes: Secondary | ICD-10-CM | POA: Diagnosis not present

## 2021-07-06 DIAGNOSIS — E559 Vitamin D deficiency, unspecified: Secondary | ICD-10-CM | POA: Diagnosis not present

## 2021-07-06 DIAGNOSIS — D509 Iron deficiency anemia, unspecified: Secondary | ICD-10-CM | POA: Diagnosis not present

## 2021-07-06 DIAGNOSIS — M109 Gout, unspecified: Secondary | ICD-10-CM | POA: Diagnosis not present

## 2021-07-06 DIAGNOSIS — I4821 Permanent atrial fibrillation: Secondary | ICD-10-CM | POA: Diagnosis not present

## 2021-07-06 DIAGNOSIS — E785 Hyperlipidemia, unspecified: Secondary | ICD-10-CM | POA: Diagnosis not present

## 2021-07-07 ENCOUNTER — Other Ambulatory Visit: Payer: Self-pay | Admitting: Family Medicine

## 2021-07-07 LAB — CMP14+EGFR
ALT: 26 IU/L (ref 0–44)
AST: 15 IU/L (ref 0–40)
Albumin/Globulin Ratio: 1.9 (ref 1.2–2.2)
Albumin: 4.5 g/dL (ref 3.7–4.7)
Alkaline Phosphatase: 121 IU/L (ref 44–121)
BUN/Creatinine Ratio: 12 (ref 10–24)
BUN: 17 mg/dL (ref 8–27)
Bilirubin Total: 0.4 mg/dL (ref 0.0–1.2)
CO2: 23 mmol/L (ref 20–29)
Calcium: 9.6 mg/dL (ref 8.6–10.2)
Chloride: 103 mmol/L (ref 96–106)
Creatinine, Ser: 1.44 mg/dL — ABNORMAL HIGH (ref 0.76–1.27)
Globulin, Total: 2.4 g/dL (ref 1.5–4.5)
Glucose: 109 mg/dL — ABNORMAL HIGH (ref 65–99)
Potassium: 4.9 mmol/L (ref 3.5–5.2)
Sodium: 142 mmol/L (ref 134–144)
Total Protein: 6.9 g/dL (ref 6.0–8.5)
eGFR: 51 mL/min/{1.73_m2} — ABNORMAL LOW (ref >59)

## 2021-07-07 LAB — VITAMIN D 25 HYDROXY (VIT D DEFICIENCY, FRACTURES): Vit D, 25-Hydroxy: 29.1 ng/mL — ABNORMAL LOW (ref 30.0–100.0)

## 2021-07-07 LAB — LIPID PANEL
Chol/HDL Ratio: 3.2 ratio (ref 0.0–5.0)
Cholesterol, Total: 177 mg/dL (ref 100–199)
HDL: 55 mg/dL (ref >39)
LDL Chol Calc (NIH): 109 mg/dL — ABNORMAL HIGH (ref 0–99)
Triglycerides: 68 mg/dL (ref 0–149)
VLDL Cholesterol Cal: 13 mg/dL (ref 5–40)

## 2021-07-07 LAB — HEMOGLOBIN A1C
Est. average glucose Bld gHb Est-mCnc: 140 mg/dL
Hgb A1c MFr Bld: 6.5 % — ABNORMAL HIGH (ref 4.8–5.6)

## 2021-07-07 LAB — URIC ACID: Uric Acid: 8.5 mg/dL — ABNORMAL HIGH (ref 3.8–8.4)

## 2021-07-07 MED ORDER — ALLOPURINOL 300 MG PO TABS: 300.0000 mg | ORAL_TABLET | Freq: Every day | ORAL | 1 refills | Status: DC

## 2021-07-15 ENCOUNTER — Other Ambulatory Visit: Payer: Self-pay | Admitting: Family Medicine

## 2021-07-15 DIAGNOSIS — I10 Essential (primary) hypertension: Secondary | ICD-10-CM

## 2021-07-20 ENCOUNTER — Other Ambulatory Visit: Payer: Self-pay

## 2021-07-20 ENCOUNTER — Ambulatory Visit (INDEPENDENT_AMBULATORY_CARE_PROVIDER_SITE_OTHER): Payer: Medicare HMO | Admitting: Family Medicine

## 2021-07-20 ENCOUNTER — Encounter: Payer: Self-pay | Admitting: Family Medicine

## 2021-07-20 VITALS — BP 136/80 | HR 80 | Resp 16 | Ht 74.0 in | Wt 277.4 lb

## 2021-07-20 DIAGNOSIS — E785 Hyperlipidemia, unspecified: Secondary | ICD-10-CM | POA: Diagnosis not present

## 2021-07-20 DIAGNOSIS — D509 Iron deficiency anemia, unspecified: Secondary | ICD-10-CM

## 2021-07-20 DIAGNOSIS — R5383 Other fatigue: Secondary | ICD-10-CM

## 2021-07-20 DIAGNOSIS — R972 Elevated prostate specific antigen [PSA]: Secondary | ICD-10-CM | POA: Diagnosis not present

## 2021-07-20 DIAGNOSIS — I4821 Permanent atrial fibrillation: Secondary | ICD-10-CM | POA: Diagnosis not present

## 2021-07-20 DIAGNOSIS — I10 Essential (primary) hypertension: Secondary | ICD-10-CM

## 2021-07-20 DIAGNOSIS — Z2821 Immunization not carried out because of patient refusal: Secondary | ICD-10-CM | POA: Diagnosis not present

## 2021-07-20 DIAGNOSIS — R7303 Prediabetes: Secondary | ICD-10-CM

## 2021-07-20 MED ORDER — BENAZEPRIL HCL 40 MG PO TABS: 40.0000 mg | ORAL_TABLET | Freq: Every day | ORAL | 3 refills | Status: DC

## 2021-07-20 MED ORDER — AMLODIPINE BESYLATE 10 MG PO TABS: 10.0000 mg | ORAL_TABLET | Freq: Every day | ORAL | 3 refills | Status: DC

## 2021-07-20 NOTE — Progress Notes (Signed)
Cartel Votava     MRN: [id number]      DOB: [birth year]   HPI Mr. Hipple is here for follow up and re-evaluation of chronic medical conditions, medication management and review of any available recent lab and radiology data.  Preventive health is updated, specifically  Cancer screening and Immunization.   Questions or concerns regarding consultations or procedures which the PT has had in the interim are  addressed. C/o rash on chlorthalidone    ROS Denies recent fever or chills. Denies sinus pressure, nasal congestion, ear pain or sore throat. Denies chest congestion, productive cough or wheezing. Denies chest pains, palpitations and leg swelling Denies abdominal pain, nausea, vomiting,diarrhea or constipation.   Denies dysuria, frequency, hesitancy or incontinence. Denies joint pain, swelling and limitation in mobility. Denies headaches, seizures, numbness, or tingling. Denies depression, anxiety or insomnia. Denies skin break down or rash.   PE BP 136/80   Pulse 80   Resp 16   Ht '6\' 2"'$  (1.88 m)   Wt 277 lb 6.4 oz (125.8 kg)   SpO2 97%   BMI 35.62 kg/m   Patient alert and oriented and in no cardiopulmonary distress.  HEENT: No facial asymmetry, EOMI,     Neck supple .  Chest: Clear to auscultation bilaterally.  CVS: S1, S2 no murmurs, no S3.IrRegularly, irregular  rate.  ABD: Soft non tender.   Ext: No edema  MS: Adequate ROM spine, shoulders, hips and knees.  Skin: Intact, no ulcerations or rash noted.  Psych: Good eye contact, normal affect. Memory intact not anxious or depressed appearing.  CNS: CN 2-12 intact, power,  normal throughout.no focal deficits noted.   Assessment & Plan  Essential hypertension, benign controlled will work on weight loss and exercise DASH diet and commitment to daily physical activity for a minimum of 30 minutes discussed and encouraged, as a part of hypertension management. The importance of attaining a healthy weight is  also discussed.  BP/Weight 07/20/2021 05/04/2021 02/19/2021 02/06/2021 01/21/2021 01/06/2021 123456  Systolic BP XX123456 [id number] 123456 123456 - 123XX123 Q000111Q  Diastolic BP 80 80 97 78 - 90 96  Wt. (Lbs) 277.4 278 280 280 281 281 284.04  BMI 35.62 35.69 36.94 36.94 36.08 36.08 36.47       Morbid obesity Improved, he is appluafded on this  Patient re-educated about  the importance of commitment to a  minimum of 150 minutes of exercise per week as able.  The importance of healthy food choices with portion control discussed, as well as eating regularly and within a 12 hour window most days. The need to choose "clean , green" food 50 to 75% of the time is discussed, as well as to make water the primary drink and set a goal of 64 ounces water daily.    Weight /BMI 07/20/2021 05/04/2021 02/19/2021  WEIGHT 277 lb 6.4 oz 278 lb 280 lb  HEIGHT '6\' 2"'$  '6\' 2"'$  '6\' 1"'$   BMI 35.62 kg/m2 35.69 kg/m2 36.94 kg/m2      Prediabetes Deteriorated Patient educated about the importance of limiting  Carbohydrate intake , the need to commit to daily physical activity for a minimum of 30 minutes , and to commit weight loss. The fact that changes in all these areas will reduce or eliminate all together the development of diabetes is stressed.   Diabetic Labs Latest Ref Rng & Units 07/06/2021 01/19/2021 11/24/2020 11/19/2019 05/29/2019  HbA1c 4.8 - 5.6 % 6.5(H) 6.4(H) 6.3(H) 6.4(H) 6.1(H)  Microalbumin 0.00 - 1.89 mg/dL - - - - -  Micro/Creat Ratio 0.0 - 30.0 mg/g - - - - -  Chol 100 - 199 mg/dL 177 208(H) 204(H) 189 200(H)  HDL >39 mg/dL 55 45 48 45 45  Calc LDL 0 - 99 mg/dL 109(H) 144(H) 138(H) 124(H) 134(H)  Triglycerides 0 - 149 mg/dL 68 108 100 102 99  Creatinine 0.76 - 1.27 mg/dL 1.44(H) 1.63(H) 1.26 1.12 1.30(H)   BP/Weight 07/20/2021 05/04/2021 02/19/2021 02/06/2021 01/21/2021 01/06/2021 123456  Systolic BP XX123456 A999333 123456 123456 - 123XX123 Q000111Q  Diastolic BP 80 80 97 78 - 90 96  Wt. (Lbs) 277.4 278 280 280 281 281 284.04  BMI 35.62 35.69  36.94 36.94 36.08 36.08 36.47   Foot/eye exam completion dates 05/20/2016  Foot Form Completion Done      Refused H1N1 influenza virus immunization Re educate re  Vaccine, still unwilling to take vaccines  Atrial fibrillation Chronic and unchanged, unwilling to  Be anticoagulated, is aware of increased stroke risk

## 2021-07-20 NOTE — Patient Instructions (Addendum)
Annual exam with MD Jan 27 or after, call if you need me sooner  Fasting lipid, cmp and eGFr, HBA1C, PSA , CBC and TSH in January  Aim for 6 pound weight loss  Congrats on weight loss and improved cholesterol  Blood sugar has increased slightly, so change snack to vegetables or fruit, leave the candies  Reconsider vaccines please  It is important that you exercise regularly at least 30 minutes 5 times a week. If you develop chest pain, have severe difficulty breathing, or feel very tired, stop exercising immediately and seek medical attention    Think about what you will eat, plan ahead. Choose " clean, green, fresh or frozen" over canned, processed or packaged foods which are more sugary, salty and fatty. 70 to 75% of food eaten should be vegetables and fruit. Three meals at set times with snacks allowed between meals, but they must be fruit or vegetables. Aim to eat over a 12 hour period , example 7 am to 7 pm, and STOP after  your last meal of the day. Drink water,generally about 64 ounces per day, no other drink is as healthy. Fruit juice is best enjoyed in a healthy way, by EATING the fruit.  Thanks for choosing [facility], we consider it a privelige to serve you. CBc and TSH

## 2021-08-03 NOTE — Assessment & Plan Note (Signed)
Deteriorated Patient educated about the importance of limiting  Carbohydrate intake , the need to commit to daily physical activity for a minimum of 30 minutes , and to commit weight loss. The fact that changes in all these areas will reduce or eliminate all together the development of diabetes is stressed.   Diabetic Labs Latest Ref Rng & Units 07/06/2021 01/19/2021 11/24/2020 11/19/2019 05/29/2019  HbA1c 4.8 - 5.6 % 6.5(H) 6.4(H) 6.3(H) 6.4(H) 6.1(H)  Microalbumin 0.00 - 1.89 mg/dL - - - - -  Micro/Creat Ratio 0.0 - 30.0 mg/g - - - - -  Chol 100 - 199 mg/dL 177 208(H) 204(H) 189 200(H)  HDL >39 mg/dL 55 45 48 45 45  Calc LDL 0 - 99 mg/dL 109(H) 144(H) 138(H) 124(H) 134(H)  Triglycerides 0 - 149 mg/dL 68 108 100 102 99  Creatinine 0.76 - 1.27 mg/dL 1.44(H) 1.63(H) 1.26 1.12 1.30(H)   BP/Weight 07/20/2021 05/04/2021 02/19/2021 02/06/2021 01/21/2021 01/06/2021 123456  Systolic BP XX123456 A999333 123456 123456 - 123XX123 Q000111Q  Diastolic BP 80 80 97 78 - 90 96  Wt. (Lbs) 277.4 278 280 280 281 281 284.04  BMI 35.62 35.69 36.94 36.94 36.08 36.08 36.47   Foot/eye exam completion dates 05/20/2016  Foot Form Completion Done

## 2021-08-03 NOTE — Assessment & Plan Note (Signed)
Re educate re  Vaccine, still unwilling to take vaccines

## 2021-08-03 NOTE — Assessment & Plan Note (Signed)
Improved, he is appluafded on this  Patient re-educated about  the importance of commitment to a  minimum of 150 minutes of exercise per week as able.  The importance of healthy food choices with portion control discussed, as well as eating regularly and within a 12 hour window most days. The need to choose "clean , green" food 50 to 75% of the time is discussed, as well as to make water the primary drink and set a goal of 64 ounces water daily.    Weight /BMI 07/20/2021 05/04/2021 02/19/2021  WEIGHT 277 lb 6.4 oz 278 lb 280 lb  HEIGHT '6\' 2"'$  '6\' 2"'$  '6\' 1"'$   BMI 35.62 kg/m2 35.69 kg/m2 36.94 kg/m2

## 2021-08-03 NOTE — Assessment & Plan Note (Signed)
Chronic and unchanged, unwilling to  Be anticoagulated, is aware of increased stroke risk

## 2021-08-03 NOTE — Assessment & Plan Note (Signed)
controlled will work on weight loss and exercise DASH diet and commitment to daily physical activity for a minimum of 30 minutes discussed and encouraged, as a part of hypertension management. The importance of attaining a healthy weight is also discussed.  BP/Weight 07/20/2021 05/04/2021 02/19/2021 02/06/2021 01/21/2021 01/06/2021 123456  Systolic BP XX123456 [id number] 123456 123456 - 123XX123 Q000111Q  Diastolic BP 80 80 97 78 - 90 96  Wt. (Lbs) 277.4 278 280 280 281 281 284.04  BMI 35.62 35.69 36.94 36.94 36.08 36.08 36.47

## 2021-08-05 ENCOUNTER — Ambulatory Visit: Payer: Medicare HMO | Admitting: Orthopedic Surgery

## 2021-08-05 ENCOUNTER — Encounter: Payer: Self-pay | Admitting: Orthopedic Surgery

## 2021-08-06 ENCOUNTER — Ambulatory Visit: Payer: Medicare HMO | Admitting: Family Medicine

## 2021-09-04 ENCOUNTER — Telehealth: Payer: Self-pay | Admitting: *Deleted

## 2021-09-04 NOTE — Chronic Care Management (AMB) (Signed)
  Chronic Care Management   Note  09/04/2021 Name: Carlos Leon MRN: [id number] DOB: [birth year]  Carlos Leon is a 75 y.o. year old male who is a primary care patient of Moshe Cipro Norwood Levo, MD. I reached out to Carlos Leon by phone today in response to a referral sent by Carlos Leon PCP, Dr. Moshe Cipro.      Carlos Leon was given information about Chronic Care Management services today including:  CCM service includes personalized support from designated clinical staff supervised by his physician, including individualized plan of care and coordination with other care providers 24/7 contact phone numbers for assistance for urgent and routine care needs. Service will only be billed when office clinical staff spend 20 minutes or more in a month to coordinate care. Only one practitioner may furnish and bill the service in a calendar month. The patient may stop CCM services at any time (effective at the end of the month) by phone call to the office staff. The patient will be responsible for cost sharing (co-pay) of up to 20% of the service fee (after annual deductible is met).  Patient agreed to services and verbal consent obtained.   Follow up plan: Telephone appointment with care management team member scheduled for:09/08/21  [facility]  Direct Dial: [phone]

## 2021-09-08 ENCOUNTER — Ambulatory Visit (INDEPENDENT_AMBULATORY_CARE_PROVIDER_SITE_OTHER): Payer: Medicare HMO | Admitting: *Deleted

## 2021-09-08 DIAGNOSIS — E785 Hyperlipidemia, unspecified: Secondary | ICD-10-CM

## 2021-09-08 DIAGNOSIS — I10 Essential (primary) hypertension: Secondary | ICD-10-CM

## 2021-09-08 NOTE — Patient Instructions (Signed)
Visit Information   PATIENT GOALS:   Goals Addressed             This Visit's Progress    Hyperlipidemia management       Timeframe:  Long-Range Goal Priority:  Medium Start Date:        09/08/2021                     Expected End Date:      03/08/2022  - complete a health care power of attorney and living will- documents mailed to you - please look over education sent via My Chart- advanced directives - make shared treatment decisions with doctor - spend time outdoors at least 3 times a week - continue playing golf - eat heart healthy diet - avoid trans/ saturated fats - take all medications as prescribed   Why is this important?   Having a long-term illness can be scary.  It can also be stressful for you and your caregiver.  These steps may help.    Notes:      Track and Manage My Blood Pressure-Hypertension       Timeframe:  Long-Range Goal Priority:  Medium Start Date:        09/08/2021                     Expected End Date:       03/08/2022                Follow Up Date 11/17/2021   - check blood pressure 3 times per week - choose a place to take my blood pressure (home, clinic or office, retail store) - write blood pressure results in a log or diary  - follow low sodium diet- avoid fast food and salty snacks - look over education sent via My Chart- low sodium diet   Why is this important?   You won't feel high blood pressure, but it can still hurt your blood vessels.  High blood pressure can cause heart or kidney problems. It can also cause a stroke.  Making lifestyle changes like losing a little weight or eating less salt will help.  Checking your blood pressure at home and at different times of the day can help to control blood pressure.  If the doctor prescribes medicine remember to take it the way the doctor ordered.  Call the office if you cannot afford the medicine or if there are questions about it.     Notes:         Consent to CCM Services: Mr.  Bollig was given information about Chronic Care Management services including:  CCM service includes personalized support from designated clinical staff supervised by his physician, including individualized plan of care and coordination with other care providers 24/7 contact phone numbers for assistance for urgent and routine care needs. Service will only be billed when office clinical staff spend 20 minutes or more in a month to coordinate care. Only one practitioner may furnish and bill the service in a calendar month. The patient may stop CCM services at any time (effective at the end of the month) by phone call to the office staff. The patient will be responsible for cost sharing (co-pay) of up to 20% of the service fee (after annual deductible is met).  Patient agreed to services and verbal consent obtained.   Patient verbalizes understanding of instructions provided today and agrees to view in [place].   Telephone follow up  appointment with care management team member scheduled for:  11/17/2021  Advance Directive Advance directives are legal documents that allow you to make decisions about your health care and medical treatment in case you become unable to communicate for yourself. Advance directives let your wishes be known to family, friends, and health care providers. Discussing and writing advance directives should happen over time rather than all at once. Advance directives can be changed and updated at any time. There are different types of advance directives, such as: Medical power of attorney. Living will. Do not resuscitate (DNR) order or do not attempt resuscitation (DNAR) order. Health care proxy and medical power of attorney A health care proxy is also called a health care agent. This person is appointed to make medical decisions for you when you are unable to make decisions for yourself. Generally, people ask a trusted friend or family member to act as their proxy and  represent their preferences. Make sure you have an agreement with your trusted person to act as your proxy. A proxy may have to make a medical decision on your behalf if your wishes are not known. A medical power of attorney, also called a durable power of attorney for health care, is a legal document that names your health care proxy. Depending on the laws in your state, the document may need to be: Signed. Notarized. Dated. Copied. Witnessed. Incorporated into your medical record. You may also want to appoint a trusted person to manage your money in the event you are unable to do so. This is called a durable power of attorney for finances. It is a separate legal document from the durable power of attorney for health care. You may choose your health care proxy or someone different to act as your agent in money matters. If you do not appoint a proxy, or there is a concern that the proxy is not acting in your best interest, a court may appoint a guardian to act on your behalf. Living will A living will is a set of instructions that state your wishes about medical care when you cannot express them yourself. Health care providers should keep a copy of your living will in your medical record. You may want to give a copy to family members or friends. To alert caregivers in case of an emergency, you can place a card in your wallet to let them know that you have a living will and where they can find it. A living will is used if you become: Terminally ill. Disabled. Unable to communicate or make decisions. The following decisions should be included in your living will: To use or not to use life support equipment, such as dialysis machines and breathing machines (ventilators). Whether you want a DNR or DNAR order. This tells health care providers not to use cardiopulmonary resuscitation (CPR) if breathing or heartbeat stops. To use or not to use tube feeding. To be given or not to be given food and  fluids. Whether you want comfort (palliative) care when the goal becomes comfort rather than a cure. Whether you want to donate your organs and tissues. A living will does not give instructions for distributing your money and property if you should pass away. DNR or DNAR A DNR or DNAR order is a request not to have CPR in the event that your heart stops beating or you stop breathing. If a DNR or DNAR order has not been made and shared, a health care provider will try to help  any patient whose heart has stopped or who has stopped breathing. If you plan to have surgery, talk with your health care provider about how your DNR or DNAR order will be followed if problems occur. What if I do not have an advance directive? Some states assign family decision makers to act on your behalf if you do not have an advance directive. Each state has its own laws about advance directives. You may want to check with your health care provider, attorney, or state representative about the laws in your state. Summary Advance directives are legal documents that allow you to make decisions about your health care and medical treatment in case you become unable to communicate for yourself. The process of discussing and writing advance directives should happen over time. You can change and update advance directives at any time. Advance directives may include a medical power of attorney, a living will, and a DNR or DNAR order. This information is not intended to replace advice given to you by your health care provider. Make sure you discuss any questions you have with your health care provider. Document Revised: 09/16/2020 Document Reviewed: 09/16/2020 Elsevier Patient Education  [place]. Low-Sodium Eating Plan Sodium, which is an element that makes up salt, helps you maintain a healthy balance of fluids in your body. Too much sodium can increase your blood pressure and cause fluid and waste to be held in your  body. Your health care provider or dietitian may recommend following this plan if you have high blood pressure (hypertension), kidney disease, liver disease, or heart failure. Eating less sodium can help lower your blood pressure, reduce swelling, and protect your heart, liver, and kidneys. What are tips for following this plan? Reading food labels The Nutrition Facts label lists the amount of sodium in one serving of the food. If you eat more than one serving, you must multiply the listed amount of sodium by the number of servings. Choose foods with less than 140 mg of sodium per serving. Avoid foods with 300 mg of sodium or more per serving. Shopping  Look for lower-sodium products, often labeled as "low-sodium" or "no salt added." Always check the sodium content, even if foods are labeled as "unsalted" or "no salt added." Buy fresh foods. Avoid canned foods and pre-made or frozen meals. Avoid canned, cured, or processed meats. Buy breads that have less than 80 mg of sodium per slice. Cooking  Eat more home-cooked food and less restaurant, buffet, and fast food. Avoid adding salt when cooking. Use salt-free seasonings or herbs instead of table salt or sea salt. Check with your health care provider or pharmacist before using salt substitutes. Cook with plant-based oils, such as canola, sunflower, or olive oil. Meal planning When eating at a restaurant, ask that your food be prepared with less salt or no salt, if possible. Avoid dishes labeled as brined, pickled, cured, smoked, or made with soy sauce, miso, or teriyaki sauce. Avoid foods that contain MSG (monosodium glutamate). MSG is sometimes added to [place] food, bouillon, and some canned foods. Make meals that can be grilled, baked, poached, roasted, or steamed. These are generally made with less sodium. General information Most people on this plan should limit their sodium intake to 1,500-2,000 mg (milligrams) of sodium each day. What  foods should I eat? Fruits Fresh, frozen, or canned fruit. Fruit juice. Vegetables Fresh or frozen vegetables. "No salt added" canned vegetables. "No salt added" tomato sauce and paste. Low-sodium or reduced-sodium tomato and vegetable  juice. Grains Low-sodium cereals, including oats, puffed wheat and rice, and shredded wheat. Low-sodium crackers. Unsalted rice. Unsalted pasta. Low-sodium bread. Whole-grain breads and whole-grain pasta. Meats and other proteins Fresh or frozen (no salt added) meat, poultry, seafood, and fish. Low-sodium canned tuna and salmon. Unsalted nuts. Dried peas, beans, and lentils without added salt. Unsalted canned beans. Eggs. Unsalted nut butters. Dairy Milk. Soy milk. Cheese that is naturally low in sodium, such as ricotta cheese, fresh mozzarella, or Swiss cheese. Low-sodium or reduced-sodium cheese. Cream cheese. Yogurt. Seasonings and condiments Fresh and dried herbs and spices. Salt-free seasonings. Low-sodium mustard and ketchup. Sodium-free salad dressing. Sodium-free light mayonnaise. Fresh or refrigerated horseradish. Lemon juice. Vinegar. Other foods Homemade, reduced-sodium, or low-sodium soups. Unsalted popcorn and pretzels. Low-salt or salt-free chips. The items listed above may not be a complete list of foods and beverages you can eat. Contact a dietitian for more information. What foods should I avoid? Vegetables Sauerkraut, pickled vegetables, and relishes. Olives. [place] fries. Onion rings. Regular canned vegetables (not low-sodium or reduced-sodium). Regular canned tomato sauce and paste (not low-sodium or reduced-sodium). Regular tomato and vegetable juice (not low-sodium or reduced-sodium). Frozen vegetables in sauces. Grains Instant hot cereals. Bread stuffing, pancake, and biscuit mixes. Croutons. Seasoned rice or pasta mixes. Noodle soup cups. Boxed or frozen macaroni and cheese. Regular salted crackers. Self-rising flour. Meats and other  proteins Meat or fish that is salted, canned, smoked, spiced, or pickled. Precooked or cured meat, such as sausages or meat loaves. Bernie[place] Salines. Ham. Pepperoni. Hot dogs. Corned beef. Chipped beef. Salt pork. Jerky. Pickled herring. Anchovies and sardines. Regular canned tuna. Salted nuts. Dairy Processed cheese and cheese spreads. Hard cheeses. Cheese curds. Blue cheese. Feta cheese. String cheese. Regular cottage cheese. Buttermilk. Canned milk. Fats and oils Salted butter. Regular margarine. Ghee. Bacon fat. Seasonings and condiments Onion salt, garlic salt, seasoned salt, table salt, and sea salt. Canned and packaged gravies. Worcestershire sauce. Tartar sauce. Barbecue sauce. Teriyaki sauce. Soy sauce, including reduced-sodium. Steak sauce. Fish sauce. Oyster sauce. Cocktail sauce. Horseradish that you find on the shelf. Regular ketchup and mustard. Meat flavorings and tenderizers. Bouillon cubes. Hot sauce. Pre-made or packaged marinades. Pre-made or packaged taco seasonings. Relishes. Regular salad dressings. Salsa. Other foods Salted popcorn and pretzels. Corn chips and puffs. Potato and tortilla chips. Canned or dried soups. Pizza. Frozen entrees and pot pies. The items listed above may not be a complete list of foods and beverages you should avoid. Contact a dietitian for more information. Summary Eating less sodium can help lower your blood pressure, reduce swelling, and protect your heart, liver, and kidneys. Most people on this plan should limit their sodium intake to 1,500-2,000 mg (milligrams) of sodium each day. Canned, boxed, and frozen foods are high in sodium. Restaurant foods, fast foods, and pizza are also very high in sodium. You also get sodium by adding salt to food. Try to cook at home, eat more fresh fruits and vegetables, and eat less fast food and canned, processed, or prepared foods. This information is not intended to replace advice given to you by your health care provider.  Make sure you discuss any questions you have with your health care provider. Document Revised: 01/18/2020 Document Reviewed: 11/14/2019 Elsevier Patient Education  2022 [facility], BSN RN Case Manager [facility] [phone]   CLINICAL CARE PLAN: Patient Care Plan: Hyperlipidemia     Problem Identified: Health Promotion or Disease Self-Management (Hyperlipidemia)   Priority: Medium  Long-Range Goal: Self-Management Plan Developed for hyperlipidemia   Start Date: 09/08/2021  Expected End Date: 03/08/2022  This Visit's Progress: On track  Priority: Medium  Note:   Current Barriers:  Poorly controlled hyperlipidemia, complicated by diet Current antihyperlipidemic regimen: none Most recent lipid panel:     Component Value Date/Time   CHOL 177 07/06/2021 0824   TRIG 68 07/06/2021 0824   HDL 55 07/06/2021 0824   CHOLHDL 3.2 07/06/2021 0824   CHOLHDL 4.2 11/19/2019 1020   VLDL 21 04/29/2017 0814   LDLCALC 109 (H) 07/06/2021 0824   LDLCALC 124 (H) 11/19/2019 1020   LDLDIRECT 125 05/20/2016 1645  Patient lives with wife, is independent in all aspects of his care, remains active in the community, plays golf regularly, attends church weekly, does not always eat heart healthy but is trying, eats fast food at times, reports has all medications and taking as prescribed. Reports does not have advanced directives and would like packet mailed to his home. ASCVD risk enhancing conditions: age >68, pre-diabetes, HTN Does not adhere to provider recommendations re:  RN Care Manager Clinical Goal(s):  patient will work with RN Care Manager, providers, and care team towards execution of optimized self-health management plan patient will take all medications exactly as prescribed and will call provider for medication related questions patient will attend all scheduled medical appointments: primary care provider 01/21/22  annual wellness visit 01/25/22 the  patient will demonstrate ongoing self health care management ability the patient will work with the care management team towards completion of advanced directives Interventions: Collaboration with [name], MD regarding development and update of comprehensive plan of care as evidenced by provider attestation and co-signature Inter-disciplinary care team collaboration (see longitudinal plan of care) Medication review performed; medication list updated in electronic medical record.  Inter-disciplinary care team collaboration (see longitudinal plan of care) Evaluation of current treatment plan related to hyperlipidemia and patient's adherence to plan as established by provider. Provided education to patient and/or caregiver about advanced directives Reviewed medications with patient and discussed importance of taking as prescribed Reviewed scheduled/upcoming provider appointments including: primary care provider 01/21/22 Discussed plans with patient for ongoing care management follow up and provided patient with direct contact information for care management team Mailed advanced directives packet to patient's home Education sent via My Chart- advanced directives Patient Goals/Self-Care Activities: - call for medicine refill 2 or 3 days before it runs out - keep a list of all the medicines I take; vitamins and herbals too - change to whole grain breads, cereal, pasta - eat 3 to 5 servings of fruits and vegetables each day - fill half the plate with nonstarchy vegetables - limit fast food meals to no more than 1 per week - manage portion size - complete a health care power of attorney and living will- documents mailed to you - please look over education sent via My Chart- advanced directives - make shared treatment decisions with doctor - spend time outdoors at least 3 times a week - continue playing golf - eat heart healthy diet - avoid trans/ saturated fats - take all medications  as prescribed Follow Up Plan: Telephone follow up appointment with care management team member scheduled for:  11/17/2021      Patient Care Plan: Hypertension (Adult)     Problem Identified: Hypertension (Hypertension)   Priority: Medium     Long-Range Goal: Hypertension Monitored   Start Date: 09/08/2021  Expected End Date: 03/08/2022  This Visit's Progress: On track  Priority: Medium  Note:   Objective:  Last practice recorded BP readings:  BP Readings from Last 3 Encounters:  07/20/21 136/80  05/04/21 (!) 142/80  02/19/21 (!) 154/97   Most recent eGFR/CrCl:  Lab Results  Component Value Date   EGFR 51 (L) 07/06/2021    No components found for: CRCL Current Barriers:  Knowledge Deficits related to basic understanding of hypertension pathophysiology and self care management- checks blood pressure " on occasion" Does not adhere to provider recommendations re:  does not always adhere to low sodium diet Case Manager Clinical Goal(s):  patient will verbalize understanding of plan for hypertension management patient will attend all scheduled medical appointments patient will demonstrate improved adherence to prescribed treatment plan for hypertension as evidenced by taking all medications as prescribed, monitoring and recording blood pressure as directed, adhering to low sodium/DASH diet Interventions:  Collaboration with [name], MD regarding development and update of comprehensive plan of care as evidenced by provider attestation and co-signature Inter-disciplinary care team collaboration (see longitudinal plan of care) Evaluation of current treatment plan related to hypertension self management and patient's adherence to plan as established by provider. Provided education to patient re: DASH diet, complications of uncontrolled blood pressure Reviewed medications with patient and discussed importance of compliance Discussed plans with patient for ongoing care  management follow up and provided patient with direct contact information for care management team Advised patient, providing education and rationale, to monitor blood pressure 3 x week  and record, calling PCP for findings outside established parameters.  Reviewed scheduled/upcoming provider appointments including: primary care provider 01/21/22 Education sent via My Chart- low sodium diet Self-Care Activities: Self administers medications as prescribed Attends all scheduled provider appointments Calls provider office for new concerns, questions, or BP outside discussed parameters Checks BP and records as discussed Follows a low sodium diet/DASH diet Patient Goals: - check blood pressure 3 times per week - choose a place to take my blood pressure (home, clinic or office, retail store) - write blood pressure results in a log or diary  - follow low sodium diet- avoid fast food and salty snacks - look over education sent via My Chart- low sodium diet Follow Up Plan: Telephone follow up appointment with care management team member scheduled for:   11/17/2021

## 2021-09-08 NOTE — Chronic Care Management (AMB) (Signed)
Chronic Care Management   CCM RN Visit Note  09/08/2021 Name: Carlos Leon MRN: [id number] DOB: Nov 06, 1946  Subjective: Carlos Leon is a 75 y.o. year old male who is a primary care patient of Moshe Cipro Norwood Levo, MD. The care management team was consulted for assistance with disease management and care coordination needs.    Engaged with patient by telephone for initial visit in response to provider referral for case management and/or care coordination services.   Consent to Services:  The patient was given the following information about Chronic Care Management services today, agreed to services, and gave verbal consent: 1. CCM service includes personalized support from designated clinical staff supervised by the primary care provider, including individualized plan of care and coordination with other care providers 2. 24/7 contact phone numbers for assistance for urgent and routine care needs. 3. Service will only be billed when office clinical staff spend 20 minutes or more in a month to coordinate care. 4. Only one practitioner may furnish and bill the service in a calendar month. 5.The patient may stop CCM services at any time (effective at the end of the month) by phone call to the office staff. 6. The patient will be responsible for cost sharing (co-pay) of up to 20% of the service fee (after annual deductible is met). Patient agreed to services and consent obtained.  Patient agreed to services and verbal consent obtained.   Assessment: Review of patient past medical history, allergies, medications, health status, including review of consultants reports, laboratory and other test data, was performed as part of comprehensive evaluation and provision of chronic care management services.   SDOH (Social Determinants of Health) assessments and interventions performed:  SDOH Interventions    Flowsheet Row Most Recent Value  SDOH Interventions   Food Insecurity Interventions Intervention  Not Indicated  Transportation Interventions Intervention Not Indicated        CCM Care Plan  Allergies  Allergen Reactions   Chlorthalidone Rash    Outpatient Encounter Medications as of 09/08/2021  Medication Sig   allopurinol (ZYLOPRIM) 300 MG tablet Take 1 tablet (300 mg total) by mouth daily.   amLODipine (NORVASC) 10 MG tablet Take 1 tablet (10 mg total) by mouth daily.   benazepril (LOTENSIN) 40 MG tablet Take 1 tablet (40 mg total) by mouth daily.   Multiple Vitamin (MULTIVITAMIN) tablet Take 1 tablet by mouth daily.   naproxen sodium (ALEVE) 220 MG tablet Take 440 mg by mouth 2 (two) times daily as needed (knee pain.).   No facility-administered encounter medications on file as of 09/08/2021.    Patient Active Problem List   Diagnosis Date Noted   Encounter for Medicare annual examination with abnormal findings 11/25/2020   Refused H1N1 influenza virus immunization 09/17/2020   Hearing loss 11/25/2019   IDA (iron deficiency anemia) 11/16/2018   Poor urinary stream 09/30/2018   Asymptomatic proteinuria 08/08/2013   Elevated PSA 11/19/2012   Atrial fibrillation ([place]) 02/07/2012   Tubular adenoma of colon 10/27/2011   Hyperlipemia 08/26/2009   Morbid obesity ([place]) 04/18/2009   Prediabetes 10/21/2008   GOUT, UNSPECIFIED 10/21/2008   Essential hypertension, benign 10/21/2008    Conditions to be addressed/monitored:HTN and HLD  Care Plan : Hyperlipidemia  Updates made by [name], RN since 09/08/2021 12:00 AM     Problem: Health Promotion or Disease Self-Management (Hyperlipidemia)   Priority: Medium     Long-Range Goal: Self-Management Plan Developed for hyperlipidemia   Start Date: 09/08/2021  Expected End Date: 03/08/2022  This Visit's Progress: On track  Priority: Medium  Note:   Current Barriers:  Poorly controlled hyperlipidemia, complicated by diet Current antihyperlipidemic regimen: none Most recent lipid panel:     Component Value Date/Time    CHOL 177 07/06/2021 0824   TRIG 68 07/06/2021 0824   HDL 55 07/06/2021 0824   CHOLHDL 3.2 07/06/2021 0824   CHOLHDL 4.2 11/19/2019 1020   VLDL 21 04/29/2017 0814   LDLCALC 109 (H) 07/06/2021 0824   LDLCALC 124 (H) 11/19/2019 1020   LDLDIRECT 125 05/20/2016 1645  Patient lives with wife, is independent in all aspects of his care, remains active in the community, plays golf regularly, attends church weekly, does not always eat heart healthy but is trying, eats fast food at times, reports has all medications and taking as prescribed. Reports does not have advanced directives and would like packet mailed to his home. ASCVD risk enhancing conditions: age >51, pre-diabetes, HTN Does not adhere to provider recommendations re:  RN Care Manager Clinical Goal(s):  patient will work with RN Care Manager, providers, and care team towards execution of optimized self-health management plan patient will take all medications exactly as prescribed and will call provider for medication related questions patient will attend all scheduled medical appointments: primary care provider 01/21/22  annual wellness visit 01/25/22 the patient will demonstrate ongoing self health care management ability the patient will work with the care management team towards completion of advanced directives Interventions: Collaboration with [name], MD regarding development and update of comprehensive plan of care as evidenced by provider attestation and co-signature Inter-disciplinary care team collaboration (see longitudinal plan of care) Medication review performed; medication list updated in electronic medical record.  Inter-disciplinary care team collaboration (see longitudinal plan of care) Evaluation of current treatment plan related to hyperlipidemia and patient's adherence to plan as established by provider. Provided education to patient and/or caregiver about advanced directives Reviewed medications with  patient and discussed importance of taking as prescribed Reviewed scheduled/upcoming provider appointments including: primary care provider 01/21/22 Discussed plans with patient for ongoing care management follow up and provided patient with direct contact information for care management team Mailed advanced directives packet to patient's home Education sent via My Chart- advanced directives Patient Goals/Self-Care Activities: - call for medicine refill 2 or 3 days before it runs out - keep a list of all the medicines I take; vitamins and herbals too - change to whole grain breads, cereal, pasta - eat 3 to 5 servings of fruits and vegetables each day - fill half the plate with nonstarchy vegetables - limit fast food meals to no more than 1 per week - manage portion size - complete a health care power of attorney and living will- documents mailed to you - please look over education sent via My Chart- advanced directives - make shared treatment decisions with doctor - spend time outdoors at least 3 times a week - continue playing golf - eat heart healthy diet - avoid trans/ saturated fats - take all medications as prescribed Follow Up Plan: Telephone follow up appointment with care management team member scheduled for:  11/17/2021      Care Plan : Hypertension (Adult)  Updates made by [name], RN since 09/08/2021 12:00 AM     Problem: Hypertension (Hypertension)   Priority: Medium     Long-Range Goal: Hypertension Monitored   Start Date: 09/08/2021  Expected End Date: 03/08/2022  This Visit's Progress: On track  Priority: Medium  Note:  Objective:  Last practice recorded BP readings:  BP Readings from Last 3 Encounters:  07/20/21 136/80  05/04/21 (!) 142/80  02/19/21 (!) 154/97   Most recent eGFR/CrCl:  Lab Results  Component Value Date   EGFR 51 (L) 07/06/2021    No components found for: CRCL Current Barriers:  Knowledge Deficits related to basic understanding  of hypertension pathophysiology and self care management- checks blood pressure " on occasion" Does not adhere to provider recommendations re:  does not always adhere to low sodium diet Case Manager Clinical Goal(s):  patient will verbalize understanding of plan for hypertension management patient will attend all scheduled medical appointments patient will demonstrate improved adherence to prescribed treatment plan for hypertension as evidenced by taking all medications as prescribed, monitoring and recording blood pressure as directed, adhering to low sodium/DASH diet Interventions:  Collaboration with [name], MD regarding development and update of comprehensive plan of care as evidenced by provider attestation and co-signature Inter-disciplinary care team collaboration (see longitudinal plan of care) Evaluation of current treatment plan related to hypertension self management and patient's adherence to plan as established by provider. Provided education to patient re: DASH diet, complications of uncontrolled blood pressure Reviewed medications with patient and discussed importance of compliance Discussed plans with patient for ongoing care management follow up and provided patient with direct contact information for care management team Advised patient, providing education and rationale, to monitor blood pressure 3 x week  and record, calling PCP for findings outside established parameters.  Reviewed scheduled/upcoming provider appointments including: primary care provider 01/21/22 Education sent via My Chart- low sodium diet Self-Care Activities: Self administers medications as prescribed Attends all scheduled provider appointments Calls provider office for new concerns, questions, or BP outside discussed parameters Checks BP and records as discussed Follows a low sodium diet/DASH diet Patient Goals: - check blood pressure 3 times per week - choose a place to take my blood  pressure (home, clinic or office, retail store) - write blood pressure results in a log or diary  - follow low sodium diet- avoid fast food and salty snacks - look over education sent via My Chart- low sodium diet Follow Up Plan: Telephone follow up appointment with care management team member scheduled for:   11/17/2021     Plan:Telephone follow up appointment with care management team member scheduled for:  11/17/2021  Jacqlyn Larsen [facility], BSN RN Case Manager [facility] [phone]

## 2021-09-25 DIAGNOSIS — E785 Hyperlipidemia, unspecified: Secondary | ICD-10-CM

## 2021-09-25 DIAGNOSIS — I10 Essential (primary) hypertension: Secondary | ICD-10-CM

## 2021-10-01 ENCOUNTER — Emergency Department (HOSPITAL_COMMUNITY)
Admission: EM | Admit: 2021-10-01 | Discharge: 2021-10-01 | Disposition: A | Discharge status: Home / Self Care | Payer: Medicare HMO | Attending: Emergency Medicine | Admitting: Emergency Medicine

## 2021-10-01 ENCOUNTER — Other Ambulatory Visit: Payer: Self-pay

## 2021-10-01 ENCOUNTER — Encounter (HOSPITAL_COMMUNITY): Payer: Self-pay | Admitting: *Deleted

## 2021-10-01 DIAGNOSIS — R339 Retention of urine, unspecified: Secondary | ICD-10-CM | POA: Insufficient documentation

## 2021-10-01 DIAGNOSIS — Z87891 Personal history of nicotine dependence: Secondary | ICD-10-CM | POA: Insufficient documentation

## 2021-10-01 DIAGNOSIS — I10 Essential (primary) hypertension: Secondary | ICD-10-CM | POA: Insufficient documentation

## 2021-10-01 DIAGNOSIS — Z79899 Other long term (current) drug therapy: Secondary | ICD-10-CM | POA: Insufficient documentation

## 2021-10-01 DIAGNOSIS — R1030 Lower abdominal pain, unspecified: Secondary | ICD-10-CM | POA: Insufficient documentation

## 2021-10-01 DIAGNOSIS — R338 Other retention of urine: Secondary | ICD-10-CM

## 2021-10-01 LAB — URINALYSIS, ROUTINE W REFLEX MICROSCOPIC
Bacteria, UA: NONE SEEN
Bilirubin Urine: NEGATIVE
Glucose, UA: NEGATIVE mg/dL
Ketones, ur: NEGATIVE mg/dL
Leukocytes,Ua: NEGATIVE
Nitrite: NEGATIVE
Protein, ur: >=300 mg/dL — AB
RBC / HPF: >50 RBC/hpf — ABNORMAL HIGH (ref 0–5)
Specific Gravity, Urine: 1.009 (ref 1.005–1.030)
pH: 6 (ref 5.0–8.0)

## 2021-10-01 NOTE — ED Provider Notes (Signed)
[facility] EMERGENCY DEPARTMENT Provider Note   CSN: [id number] Arrival date & time: 10/01/21  2263     History Chief Complaint  Patient presents with  . Urinary Retention    Carlos Leon is a 75 y.o. male.  HPI Patient presents with his wife who assists with HPI.  Patient has a history of prostate a megaly, but no prior similar events.  He notes that after being able to urinate overnight, this morning he has been unable to do so and has had increasing suprapubic pain.  No fever, vomiting, diarrhea.  No recent change in medication, diet, activity.  No relief with anything, and with severe increasing abdominal pain he presents for evaluation.    Past Medical History:  Diagnosis Date  . Arthritis   . Atrial fibrillation ([place])    Present by ECG 01/2012  . Colon polyps   . Diverticulosis of colon   . Erectile dysfunction   . Essential hypertension   . Gout   . Hyperlipidemia   . Metabolic syndrome X 02/27/5455  . Obesity     Patient Active Problem List   Diagnosis Date Noted  . Encounter for Medicare annual examination with abnormal findings 11/25/2020  . Refused H1N1 influenza virus immunization 09/17/2020  . Hearing loss 11/25/2019  . IDA (iron deficiency anemia) 11/16/2018  . Poor urinary stream 09/30/2018  . Asymptomatic proteinuria 08/08/2013  . Elevated PSA 11/19/2012  . Atrial fibrillation ([place]) 02/07/2012  . Tubular adenoma of colon 10/27/2011  . Hyperlipemia 08/26/2009  . Morbid obesity ([place]) 04/18/2009  . Prediabetes 10/21/2008  . GOUT, UNSPECIFIED 10/21/2008  . Essential hypertension, benign 10/21/2008    Past Surgical History:  Procedure Laterality Date  . Colon polyps removed  2002   Dr. Tamala Julian  . COLONOSCOPY  2004   Dr. Misty Stanley prep, ACBE showed diverticulosis  . COLONOSCOPY  11/24/2011   Procedure: COLONOSCOPY;  Surgeon: Daneil Dolin, MD;  Location: [facility];  Service: Endoscopy;  Laterality: N/A;  9:10  . COLONOSCOPY N/A 03/26/2020    Procedure: COLONOSCOPY;  Surgeon: Daneil Dolin, MD;  Location: [facility] ENDO SUITE;  Service: Endoscopy;  Laterality: N/A;  9:30  . HERNIA REPAIR    . PROSTATE BIOPSY         Family History  Problem Relation Age of Onset  . Hypertension Mother   . Cancer Mother        Rectal, approximately 52  . Diabetes Father   . Alzheimer's disease Father   . Liver disease Neg Hx   . Colon cancer Neg Hx     Social History   Tobacco Use  . Smoking status: Former    Packs/day: 2.00    Years: 5.00    Pack years: 10.00    Types: Cigarettes  . Smokeless tobacco: Never  Vaping Use  . Vaping Use: Never used  Substance Use Topics  . Alcohol use: No  . Drug use: No    Home Medications Prior to Admission medications   Medication Sig Start Date End Date Taking? Authorizing Provider  allopurinol (ZYLOPRIM) 300 MG tablet Take 1 tablet (300 mg total) by mouth daily. Patient taking differently: Take 300 mg by mouth 3 (three) times a week. 07/07/21  Yes [name], MD  amLODipine (NORVASC) 10 MG tablet Take 1 tablet (10 mg total) by mouth daily. 07/20/21  Yes [name], MD  benazepril (LOTENSIN) 40 MG tablet Take 1 tablet (40 mg total) by mouth daily. 07/20/21  Yes  [name], MD  naproxen sodium (ALEVE) 220 MG tablet Take 440 mg by mouth 2 (two) times daily as needed (knee pain.).   Yes [provider]    Allergies    Chlorthalidone  Review of Systems   Review of Systems  Constitutional:        Per HPI, otherwise negative  HENT:         Per HPI, otherwise negative  Respiratory:         Per HPI, otherwise negative  Cardiovascular:        Per HPI, otherwise negative  Gastrointestinal:  Negative for vomiting.  Endocrine:       Negative aside from HPI  Genitourinary:        Neg aside from HPI   Musculoskeletal:        Per HPI, otherwise negative  Skin: Negative.   Neurological:  Negative for syncope.   Physical Exam Updated Vital Signs BP (!)  140/95 (BP Location: Right Arm)   Pulse 94   Temp 98.3 F (36.8 C)   Resp 20   Ht 6' 1.5" (1.867 m)   Wt 125.2 kg   SpO2 97%   BMI 35.92 kg/m   Physical Exam Vitals and nursing note reviewed.  Constitutional:      General: He is not in acute distress.    Appearance: He is well-developed.  HENT:     Head: Normocephalic and atraumatic.  Eyes:     Conjunctiva/sclera: Conjunctivae normal.  Pulmonary:     Effort: Pulmonary effort is normal. No respiratory distress.     Breath sounds: No stridor.  Abdominal:     General: There is no distension.  Skin:    General: Skin is warm and dry.  Neurological:     Mental Status: He is alert and oriented to person, place, and time.    ED Results / Procedures / Treatments   Labs (all labs ordered are listed, but only abnormal results are displayed) Labs Reviewed  URINALYSIS, ROUTINE W REFLEX MICROSCOPIC - Abnormal; Notable for the following components:      Result Value   Hgb urine dipstick MODERATE (*)    Protein, ur >=300 (*)    RBC / HPF >50 (*)    All other components within normal limits    EKG None  Radiology No results found.  Procedures Procedures   Medications Ordered in ED Medications - No data to display  ED Course  I have reviewed the triage vital signs and the nursing notes.  Pertinent labs & imaging results that were available during my care of the patient were reviewed by me and considered in my medical decision making (see chart for details).   10:50 AM After initial bedside ultrasound demonstrated greater than 500 mL of fluid in his bladder, patient had Foley catheter placement.  This resulted in greater than 1 L production of urine.  Patient had substantial increase in his comfort following this procedure, which was well-tolerated.  Update:, Urinalysis unremarkable.  MDM Rules/Calculators/A&P Adult male presents with acute urinary retention.  Given his history of prostatomegaly, there is some suspicion  for this contributing to the episode.  He is awake, alert, afebrile, speaking clearly, has a generally nontoxic appearance, and improved substantially after placement of Foley catheter.  No UA evidence for infection, with no hemodynamic instability, patient appropriate for discharge with outpatient urology management. Final Clinical Impression(s) / ED Diagnoses Final diagnoses:  Acute urinary retention    Rx / DC  Orders ED Discharge Orders     None        Carmin Muskrat, MD 10/01/21 1051

## 2021-10-01 NOTE — ED Notes (Signed)
Leg bag provided for catheter. Draining clear/sanguinous urine. EDP notified and assessed prior to discharge. No concern at this time and plan for f/u with urology.

## 2021-10-01 NOTE — ED Triage Notes (Signed)
Pt c/o being unable to urinate since last night. He c/o feeling like pressure is building up in his bladder.

## 2021-10-01 NOTE — Discharge Instructions (Addendum)
As discussed, it is important you follow-up with your urologist for ongoing management and conversation about your episode of acute urinary retention.  Do not hesitate to return here for concerning changes in your condition.

## 2021-10-02 ENCOUNTER — Encounter (HOSPITAL_COMMUNITY): Payer: Self-pay | Admitting: *Deleted

## 2021-10-02 ENCOUNTER — Emergency Department (HOSPITAL_COMMUNITY)
Admission: EM | Admit: 2021-10-02 | Discharge: 2021-10-02 | Disposition: A | Discharge status: Home / Self Care | Payer: Medicare HMO | Attending: Emergency Medicine | Admitting: Emergency Medicine

## 2021-10-02 ENCOUNTER — Other Ambulatory Visit: Payer: Self-pay

## 2021-10-02 DIAGNOSIS — I10 Essential (primary) hypertension: Secondary | ICD-10-CM | POA: Diagnosis not present

## 2021-10-02 DIAGNOSIS — Z87891 Personal history of nicotine dependence: Secondary | ICD-10-CM | POA: Insufficient documentation

## 2021-10-02 DIAGNOSIS — R319 Hematuria, unspecified: Secondary | ICD-10-CM | POA: Insufficient documentation

## 2021-10-02 LAB — BASIC METABOLIC PANEL
Anion gap: 7 (ref 5–15)
BUN: 20 mg/dL (ref 8–23)
CO2: 27 mmol/L (ref 22–32)
Calcium: 9 mg/dL (ref 8.9–10.3)
Chloride: 104 mmol/L (ref 98–111)
Creatinine, Ser: 1.53 mg/dL — ABNORMAL HIGH (ref 0.61–1.24)
GFR, Estimated: 47 mL/min — ABNORMAL LOW (ref >60)
Glucose, Bld: 131 mg/dL — ABNORMAL HIGH (ref 70–99)
Potassium: 4.7 mmol/L (ref 3.5–5.1)
Sodium: 138 mmol/L (ref 135–145)

## 2021-10-02 LAB — URINALYSIS, ROUTINE W REFLEX MICROSCOPIC
Bacteria, UA: NONE SEEN
Bilirubin Urine: NEGATIVE
Glucose, UA: NEGATIVE mg/dL
Ketones, ur: NEGATIVE mg/dL
Nitrite: NEGATIVE
Protein, ur: >=300 mg/dL — AB
RBC / HPF: >50 RBC/hpf — ABNORMAL HIGH (ref 0–5)
Specific Gravity, Urine: 1.012 (ref 1.005–1.030)
WBC, UA: >50 WBC/hpf — ABNORMAL HIGH (ref 0–5)
pH: 6 (ref 5.0–8.0)

## 2021-10-02 LAB — CBC WITH DIFFERENTIAL/PLATELET
Abs Immature Granulocytes: 0.08 10*3/uL — ABNORMAL HIGH (ref 0.00–0.07)
Basophils Absolute: 0 10*3/uL (ref 0.0–0.1)
Basophils Relative: 0 %
Eosinophils Absolute: 0.1 10*3/uL (ref 0.0–0.5)
Eosinophils Relative: 1 %
HCT: 40.8 % (ref 39.0–52.0)
Hemoglobin: 13.2 g/dL (ref 13.0–17.0)
Immature Granulocytes: 1 %
Lymphocytes Relative: 12 %
Lymphs Abs: 1.1 10*3/uL (ref 0.7–4.0)
MCH: 25.9 pg — ABNORMAL LOW (ref 26.0–34.0)
MCHC: 32.4 g/dL (ref 30.0–36.0)
MCV: 80.2 fL (ref 80.0–100.0)
Monocytes Absolute: 0.6 10*3/uL (ref 0.1–1.0)
Monocytes Relative: 6 %
Neutro Abs: 7.6 10*3/uL (ref 1.7–7.7)
Neutrophils Relative %: 80 %
Platelets: 243 10*3/uL (ref 150–400)
RBC: 5.09 MIL/uL (ref 4.22–5.81)
RDW: 14.9 % (ref 11.5–15.5)
WBC: 9.4 10*3/uL (ref 4.0–10.5)
nRBC: 0 % (ref 0.0–0.2)

## 2021-10-02 MED ORDER — CEPHALEXIN 500 MG PO CAPS: 500.0000 mg | ORAL_CAPSULE | Freq: Four times a day (QID) | ORAL | 0 refills | Status: DC

## 2021-10-02 NOTE — Discharge Instructions (Addendum)
You are seen in the emergency department for blood in your urine after having a Foley catheter placed yesterday.  You had lab work and a urinalysis.  The bleeding is likely due to movement of the catheter.  This is usually not a problem and will resolve over time.  You should drink plenty of fluids.  If you notice that the catheter is not draining and you are starting to have abdominal discomfort or any fever please return to the emergency department.  We are starting you on some antibiotics for possible infection.  Please follow-up with [facility].

## 2021-10-02 NOTE — ED Provider Notes (Signed)
[facility] EMERGENCY DEPARTMENT Provider Note   CSN: [id number] Arrival date & time: 10/02/21  6270     History Chief Complaint  Patient presents with   Hematuria    Carlos Leon is a 75 y.o. male.  He has a history of atrial fibrillation but is not on blood thinners.  He was seen here yesterday for urinary retention and had a Foley catheter placed.  He said there was some blood initially but then the urine was yellow.  Last night he started noticing it was red and it continues to be today.  He is not sure but he thinks he might of pulled on the catheter once or twice by accident.  No fever shortness of breath or abdominal pain.  No other complaints.  The history is provided by the patient.  Hematuria This is a new problem. The current episode started yesterday. The problem occurs constantly. The problem has not changed since onset.Pertinent negatives include no chest pain, no abdominal pain, no headaches and no shortness of breath. Nothing aggravates the symptoms. Nothing relieves the symptoms. He has tried nothing for the symptoms. The treatment provided no relief.      Past Medical History:  Diagnosis Date   Arthritis    Atrial fibrillation ([place])    Present by ECG 01/2012   Colon polyps    Diverticulosis of colon    Erectile dysfunction    Essential hypertension    Gout    Hyperlipidemia    Metabolic syndrome X 02/29/92   Obesity     Patient Active Problem List   Diagnosis Date Noted   Encounter for Medicare annual examination with abnormal findings 11/25/2020   Refused H1N1 influenza virus immunization 09/17/2020   Hearing loss 11/25/2019   IDA (iron deficiency anemia) 11/16/2018   Poor urinary stream 09/30/2018   Asymptomatic proteinuria 08/08/2013   Elevated PSA 11/19/2012   Atrial fibrillation ([place]) 02/07/2012   Tubular adenoma of colon 10/27/2011   Hyperlipemia 08/26/2009   Morbid obesity ([place]) 04/18/2009   Prediabetes 10/21/2008   GOUT, UNSPECIFIED  10/21/2008   Essential hypertension, benign 10/21/2008    Past Surgical History:  Procedure Laterality Date   Colon polyps removed  2002   Dr. Tamala Julian   COLONOSCOPY  2004   Dr. Misty Stanley prep, ACBE showed diverticulosis   COLONOSCOPY  11/24/2011   Procedure: COLONOSCOPY;  Surgeon: Daneil Dolin, MD;  [facility];  Service: Endoscopy;  Laterality: N/A;  9:10   COLONOSCOPY N/A 03/26/2020   Procedure: COLONOSCOPY;  Surgeon: Daneil Dolin, MD;  Location: [facility];  Service: Endoscopy;  Laterality: N/A;  9:30   HERNIA REPAIR     PROSTATE BIOPSY         Family History  Problem Relation Age of Onset   Hypertension Mother    Cancer Mother        Rectal, approximately 45   Diabetes Father    Alzheimer's disease Father    Liver disease Neg Hx    Colon cancer Neg Hx     Social History   Tobacco Use   Smoking status: Former    Packs/day: 2.00    Years: 5.00    Pack years: 10.00    Types: Cigarettes   Smokeless tobacco: Never  Vaping Use   Vaping Use: Never used  Substance Use Topics   Alcohol use: No   Drug use: No    Home Medications Prior to Admission medications   Medication Sig Start Date  End Date Taking? Authorizing Provider  allopurinol (ZYLOPRIM) 300 MG tablet Take 1 tablet (300 mg total) by mouth daily. Patient taking differently: Take 300 mg by mouth 3 (three) times a week. 07/07/21   [name], MD  amLODipine (NORVASC) 10 MG tablet Take 1 tablet (10 mg total) by mouth daily. 07/20/21   [name], MD  benazepril (LOTENSIN) 40 MG tablet Take 1 tablet (40 mg total) by mouth daily. 07/20/21   [name], MD  naproxen sodium (ALEVE) 220 MG tablet Take 440 mg by mouth 2 (two) times daily as needed (knee pain.).    [provider]    Allergies    Chlorthalidone  Review of Systems   Review of Systems  Constitutional:  Negative for fever.  HENT:  Negative for sore throat.   Eyes:  Negative for visual  disturbance.  Respiratory:  Negative for shortness of breath.   Cardiovascular:  Negative for chest pain.  Gastrointestinal:  Negative for abdominal pain.  Genitourinary:  Positive for difficulty urinating and hematuria. Negative for dysuria.  Musculoskeletal:  Negative for gait problem.  Skin:  Negative for rash.  Neurological:  Negative for headaches.   Physical Exam Updated Vital Signs BP 136/89 (BP Location: Right Arm)   Pulse 72   Temp 98.6 F (37 C) (Oral)   Resp 16   Ht 6' 1.5" (1.867 m)   Wt 125.2 kg   SpO2 100%   BMI 35.92 kg/m   Physical Exam Vitals and nursing note reviewed.  Constitutional:      Appearance: He is well-developed.  HENT:     Head: Normocephalic and atraumatic.  Eyes:     Conjunctiva/sclera: Conjunctivae normal.  Cardiovascular:     Rate and Rhythm: Normal rate. Rhythm irregular.     Heart sounds: No murmur heard. Pulmonary:     Effort: Pulmonary effort is normal. No respiratory distress.     Breath sounds: Normal breath sounds.  Abdominal:     Palpations: Abdomen is soft.     Tenderness: There is no abdominal tenderness. There is no guarding or rebound.  Musculoskeletal:     Cervical back: Neck supple.  Skin:    General: Skin is warm and dry.  Neurological:     General: No focal deficit present.     Mental Status: He is alert.    ED Results / Procedures / Treatments   Labs (all labs ordered are listed, but only abnormal results are displayed) Labs Reviewed  BASIC METABOLIC PANEL - Abnormal; Notable for the following components:      Result Value   Glucose, Bld 131 (*)    Creatinine, Ser 1.53 (*)    GFR, Estimated 47 (*)    All other components within normal limits  CBC WITH DIFFERENTIAL/PLATELET - Abnormal; Notable for the following components:   MCH 25.9 (*)    Abs Immature Granulocytes 0.08 (*)    All other components within normal limits  URINALYSIS, ROUTINE W REFLEX MICROSCOPIC - Abnormal; Notable for the following  components:   Color, Urine AMBER (*)    APPearance HAZY (*)    Hgb urine dipstick LARGE (*)    Protein, ur >=300 (*)    Leukocytes,Ua SMALL (*)    RBC / HPF >50 (*)    WBC, UA >50 (*)    All other components within normal limits  URINE CULTURE    EKG None  Radiology No results found.  Procedures Procedures   Medications Ordered in  ED Medications - No data to display  ED Course  I have reviewed the triage vital signs and the nursing notes.  Pertinent labs & imaging results that were available during my care of the patient were reviewed by me and considered in my medical decision making (see chart for details).    MDM Rules/Calculators/A&P                          75 year old male here with painless hematuria after having a Foley placed for urinary retention yesterday.  His urinalysis is concerning for possible infection.  No evidence of obstruction.  Renal function preserved.  Will cover with antibiotics recommended close follow-up with urology.  Return instructions discussed  Final Clinical Impression(s) / ED Diagnoses Final diagnoses:  Hematuria, unspecified type    Rx / DC Orders ED Discharge Orders          Ordered    cephALEXin (KEFLEX) 500 MG capsule  4 times daily        10/02/21 1110             Hayden Rasmussen, MD 10/02/21 1751

## 2021-10-02 NOTE — ED Triage Notes (Signed)
Pt c/o hematuria that started last night. He was here at the hospital yesterday for urinary retention and had a catheter placed. Pt reports his urine was yellow earlier in the day, but before bed it started to turn red.

## 2021-10-04 LAB — URINE CULTURE: Culture: NO GROWTH

## 2021-10-09 ENCOUNTER — Encounter (HOSPITAL_COMMUNITY): Payer: Self-pay | Admitting: *Deleted

## 2021-10-09 ENCOUNTER — Emergency Department (HOSPITAL_COMMUNITY)
Admission: EM | Admit: 2021-10-09 | Discharge: 2021-10-09 | Disposition: A | Discharge status: Home / Self Care | Payer: Medicare HMO | Attending: Emergency Medicine | Admitting: Emergency Medicine

## 2021-10-09 ENCOUNTER — Other Ambulatory Visit: Payer: Self-pay

## 2021-10-09 DIAGNOSIS — Z87891 Personal history of nicotine dependence: Secondary | ICD-10-CM | POA: Diagnosis not present

## 2021-10-09 DIAGNOSIS — I10 Essential (primary) hypertension: Secondary | ICD-10-CM | POA: Diagnosis not present

## 2021-10-09 DIAGNOSIS — Z79899 Other long term (current) drug therapy: Secondary | ICD-10-CM | POA: Diagnosis not present

## 2021-10-09 DIAGNOSIS — R319 Hematuria, unspecified: Secondary | ICD-10-CM | POA: Diagnosis not present

## 2021-10-09 LAB — URINALYSIS, ROUTINE W REFLEX MICROSCOPIC
Bacteria, UA: NONE SEEN
Bilirubin Urine: NEGATIVE
Glucose, UA: NEGATIVE mg/dL
Ketones, ur: NEGATIVE mg/dL
Nitrite: NEGATIVE
Protein, ur: 100 mg/dL — AB
RBC / HPF: >50 RBC/hpf — ABNORMAL HIGH (ref 0–5)
Specific Gravity, Urine: 1.011 (ref 1.005–1.030)
pH: 6 (ref 5.0–8.0)

## 2021-10-09 NOTE — ED Provider Notes (Signed)
[facility] EMERGENCY DEPARTMENT Provider Note   CSN: [id number] Arrival date & time: 10/09/21  1040     History Chief Complaint  Patient presents with   Hematuria    Carlos Leon is a 75 y.o. male.  HPI He presents for evaluation of discolored urine.  He is in the ED, about a week ago at which time he had his catheter replaced and he was started on antibiotics for UTI.  The urine has been yellow but now he is concerned that there is blood in it.  He denies abdominal pain, fever, chills, nausea or vomiting.  He is taking his usual medications.  He is here with his wife who helps to give history.  There are no other known active modifying factors.    Past Medical History:  Diagnosis Date   Arthritis    Atrial fibrillation ([place])    Present by ECG 01/2012   Colon polyps    Diverticulosis of colon    Erectile dysfunction    Essential hypertension    Gout    Hyperlipidemia    Metabolic syndrome X 0/0/8676   Obesity     Patient Active Problem List   Diagnosis Date Noted   Encounter for Medicare annual examination with abnormal findings 11/25/2020   Refused H1N1 influenza virus immunization 09/17/2020   Hearing loss 11/25/2019   IDA (iron deficiency anemia) 11/16/2018   Poor urinary stream 09/30/2018   Asymptomatic proteinuria 08/08/2013   Elevated PSA 11/19/2012   Atrial fibrillation ([place]) 02/07/2012   Tubular adenoma of colon 10/27/2011   Hyperlipemia 08/26/2009   Morbid obesity ([place]) 04/18/2009   Prediabetes 10/21/2008   GOUT, UNSPECIFIED 10/21/2008   Essential hypertension, benign 10/21/2008    Past Surgical History:  Procedure Laterality Date   Colon polyps removed  2002   Dr. Tamala Julian   COLONOSCOPY  2004   Dr. Misty Stanley prep, ACBE showed diverticulosis   COLONOSCOPY  11/24/2011   Procedure: COLONOSCOPY;  Surgeon: Daneil Dolin, MD;  [facility];  Service: Endoscopy;  Laterality: N/A;  9:10   COLONOSCOPY N/A 03/26/2020   Procedure:  COLONOSCOPY;  Surgeon: Daneil Dolin, MD;  Location[facility];  Service: Endoscopy;  Laterality: N/A;  9:30   HERNIA REPAIR     PROSTATE BIOPSY         Family History  Problem Relation Age of Onset   Hypertension Mother    Cancer Mother        Rectal, approximately 13   Diabetes Father    Alzheimer's disease Father    Liver disease Neg Hx    Colon cancer Neg Hx     Social History   Tobacco Use   Smoking status: Former    Packs/day: 2.00    Years: 5.00    Pack years: 10.00    Types: Cigarettes   Smokeless tobacco: Never  Vaping Use   Vaping Use: Never used  Substance Use Topics   Alcohol use: No   Drug use: No    Home Medications Prior to Admission medications   Medication Sig Start Date End Date Taking? Authorizing Provider  allopurinol (ZYLOPRIM) 300 MG tablet Take 1 tablet (300 mg total) by mouth daily. Patient taking differently: Take 300 mg by mouth 3 (three) times a week. 07/07/21  Yes [name], MD  amLODipine (NORVASC) 10 MG tablet Take 1 tablet (10 mg total) by mouth daily. 07/20/21  Yes [name], MD  benazepril (LOTENSIN) 40 MG tablet Take  1 tablet (40 mg total) by mouth daily. 07/20/21  Yes [name], MD  cephALEXin (KEFLEX) 500 MG capsule Take 1 capsule (500 mg total) by mouth 4 (four) times daily. 10/02/21  Yes Hayden Rasmussen, MD  naproxen sodium (ALEVE) 220 MG tablet Take 440 mg by mouth 2 (two) times daily as needed (knee pain.).   Yes [provider]    Allergies    Chlorthalidone  Review of Systems   Review of Systems  All other systems reviewed and are negative.  Physical Exam Updated Vital Signs BP (!) 133/92 (BP Location: Right Arm)   Pulse 83   Temp 98.2 F (36.8 C) (Oral)   Resp 18   Ht 6\' 2"  (1.88 m)   Wt 124.1 kg   SpO2 97%   BMI 35.13 kg/m   Physical Exam Vitals and nursing note reviewed.  Constitutional:      General: He is not in acute distress.    Appearance: He is  well-developed. He is not ill-appearing, toxic-appearing or diaphoretic.  HENT:     Head: Normocephalic and atraumatic.     Right Ear: External ear normal.     Left Ear: External ear normal.  Eyes:     Conjunctiva/sclera: Conjunctivae normal.     Pupils: Pupils are equal, round, and reactive to light.  Neck:     Trachea: Phonation normal.  Cardiovascular:     Rate and Rhythm: Normal rate.  Pulmonary:     Effort: Pulmonary effort is normal.  Abdominal:     General: There is no distension.     Tenderness: There is no abdominal tenderness.  Genitourinary:    Comments: Foley catheter in penis.  Normal-appearing penis and scrotum.  Foley catheter bag has brownish-orange urine in it.  No gross blood or clots seen in the Foley catheter bag Musculoskeletal:        General: Normal range of motion.     Cervical back: Normal range of motion and neck supple.  Skin:    General: Skin is warm and dry.  Neurological:     Mental Status: He is alert and oriented to person, place, and time.     Cranial Nerves: No cranial nerve deficit.     Sensory: No sensory deficit.     Motor: No abnormal muscle tone.     Coordination: Coordination normal.  Psychiatric:        Mood and Affect: Mood normal.        Behavior: Behavior normal.        Thought Content: Thought content normal.        Judgment: Judgment normal.    ED Results / Procedures / Treatments   Labs (all labs ordered are listed, but only abnormal results are displayed) Labs Reviewed  URINALYSIS, ROUTINE W REFLEX MICROSCOPIC - Abnormal; Notable for the following components:      Result Value   APPearance HAZY (*)    Hgb urine dipstick LARGE (*)    Protein, ur 100 (*)    Leukocytes,Ua SMALL (*)    RBC / HPF >50 (*)    All other components within normal limits    EKG None  Radiology No results found.  Procedures Procedures   Medications Ordered in ED Medications - No data to display  ED Course  I have reviewed the triage  vital signs and the nursing notes.  Pertinent labs & imaging results that were available during my care of the patient were reviewed by me  and considered in my medical decision making (see chart for details).    MDM Rules/Calculators/A&P                            Patient Vitals for the past 24 hrs:  BP Temp Temp src Pulse Resp SpO2  10/09/21 1411 (!) 133/92 98.2 F (36.8 C) Oral 83 18 97 %  10/09/21 1300 94/80 -- -- 89 16 93 %  10/09/21 1230 125/81 -- -- 73 18 100 %    12:10 PM Reevaluation with update and discussion. After initial assessment and treatment, an updated evaluation reveals he remains comfortable and has no further complaints. Daleen Bo   Medical Decision Making:  This patient is presenting for evaluation of discolored urine, which does require a range of treatment options, and is a complaint that involves a moderate risk of morbidity and mortality. The differential diagnoses include UTI, obstructed Foley catheter, metabolic disorder. I decided to review old records, and in summary elderly male, Foley catheter recently placed for urinary retention, treated for UTI at that time, presenting now with concern for blood in urine.  He does not have abdominal pain.  He has not yet followed up with urology..  I obtained additional historical information from wife at bedside.  Clinical Laboratory Tests Ordered, included Urinalysis. Review indicates urine dipstick positive for blood, greater than 50 RBCs.  11-20 white blood cells, no bacteria.  This is a Foley catheter sample.     Critical Interventions-clinical evaluation, laboratory testing, observation and reassessment  After These Interventions, the Patient was reevaluated and was found with free-flowing urine in the Foley catheter bag.  No abdominal discomfort.  Doubt urinary retention at this time.  Incidental mild hematuria, without signs or symptoms of acute infection.  Culture done at the time Foley catheter placed  is negative.  No indication for continuation of antibiotic treatment.  Suspect traumatic catheterization, leading to hematuria but generally improving.  No indication for further ED intervention at this time.  Instructed to follow-up with urology as scheduled.  CRITICAL CARE-no Performed by: Daleen Bo  Nursing Notes Reviewed/ Care Coordinated Applicable Imaging Reviewed Interpretation of Laboratory Data incorporated into ED treatment  The patient appears reasonably screened and/or stabilized for discharge and I doubt any other medical condition or other [facility] requiring further screening, evaluation, or treatment in the ED at this time prior to discharge.  Plan: Home Medications-continue usual; Home Treatments-catheter care at home; return here if the recommended treatment, does not improve the symptoms; Recommended follow up-neurology has scheduled     Final Clinical Impression(s) / ED Diagnoses Final diagnoses:  Hematuria, unspecified type    Rx / DC Orders ED Discharge Orders     None        Daleen Bo, MD 10/10/21 1210

## 2021-10-09 NOTE — Discharge Instructions (Addendum)
There was some blood in your urine today but it does not appear to be serious.  The urine culture from last week did not show infection.  You do not need to continue taking the antibiotic medication at this time.  Make sure you follow-up with the urologist, next week as scheduled so they can assess your need for ongoing catheterization.  You may continue to see some blood in the urine for the next few days.  Return here if you develop increased pain, weakness, dizziness, or see a lot of blood clots in the urine.  Return here, if needed for problems.

## 2021-10-09 NOTE — ED Triage Notes (Signed)
Hematuria onset this am , has indwelling catheter

## 2021-10-15 ENCOUNTER — Encounter: Payer: Self-pay | Admitting: Urology

## 2021-10-15 ENCOUNTER — Other Ambulatory Visit: Payer: Self-pay

## 2021-10-15 ENCOUNTER — Ambulatory Visit: Payer: Medicare HMO | Admitting: Urology

## 2021-10-15 VITALS — BP 152/77 | HR 94 | Temp 98.4°F | Wt 272.0 lb

## 2021-10-15 DIAGNOSIS — N401 Enlarged prostate with lower urinary tract symptoms: Secondary | ICD-10-CM | POA: Diagnosis not present

## 2021-10-15 DIAGNOSIS — R972 Elevated prostate specific antigen [PSA]: Secondary | ICD-10-CM | POA: Diagnosis not present

## 2021-10-15 DIAGNOSIS — R339 Retention of urine, unspecified: Secondary | ICD-10-CM | POA: Diagnosis not present

## 2021-10-15 DIAGNOSIS — N183 Chronic kidney disease, stage 3 unspecified: Secondary | ICD-10-CM | POA: Diagnosis not present

## 2021-10-15 DIAGNOSIS — N138 Other obstructive and reflux uropathy: Secondary | ICD-10-CM

## 2021-10-15 DIAGNOSIS — R310 Gross hematuria: Secondary | ICD-10-CM | POA: Diagnosis not present

## 2021-10-15 MED ORDER — ALFUZOSIN HCL ER 10 MG PO TB24: 10.0000 mg | ORAL_TABLET | Freq: Every day | ORAL | 11 refills | Status: DC

## 2021-10-15 NOTE — Progress Notes (Signed)
Subjective: 1. Elevated PSA   2. BPH with urinary obstruction   3. Urinary retention   4. Gross hematuria   5. Stage 3 chronic kidney disease, unspecified whether stage 3a or 3b CKD ([place])       02/19/21: Carlos Leon is a 75 yo Carlos Leon who is sent by Dr. Moshe Cipro for an elevated PSA that was 12.8 on 11/19/19 and 16.1 on 11/24/20.   I saw him in 2015 for a PSA of 9.28 and recommended a biopsy.  I had seen him in 2013 as well with a PSA of 7.0 and recommended a biopsy.  His PSA in 2012 was 5.99.  He reports that he had a biopsy done at [facility] about 5 years ago.  The biopsy was negative.  He is voiding with moderate LUTS and an IPSS of 18/2.   He has nocturia x 3 and some frequency and urgency.  He has had no hematuria or dysuria.   09/27/21: Carlos Leon returns today in f/u.  He was seen in the ER on 10/6 and was found to be in retention with 1023ml PVR.  He has moderate LUTS with nocturia x3 and the retention was sudden in onset.  He has known BPH with a high PSA but negative prior biopsy.  HIs last PSA was 16.1 but he never returned for the MRI that I ordered.  He had >50 RBC's but not gross hematuria.  He returned to the ER the next day for gross hematuria and had to have the catheter irrigated.   A culture was negative but he was started on keflex and the UA had >50 WBC.  The urine is now clear for the past week.  He is on no BPH meds.    ROS:  ROS  Allergies  Allergen Reactions   Chlorthalidone Rash    Past Medical History:  Diagnosis Date   Arthritis    Atrial fibrillation ([place])    Present by ECG 01/2012   Colon polyps    Diverticulosis of colon    Erectile dysfunction    Essential hypertension    Gout    Hyperlipidemia    Metabolic syndrome X 02/26/2950   Obesity     Past Surgical History:  Procedure Laterality Date   Colon polyps removed  2002   Dr. Tamala Julian   COLONOSCOPY  2004   Dr. Misty Stanley prep, ACBE showed diverticulosis   COLONOSCOPY  11/24/2011   Procedure:  COLONOSCOPY;  Surgeon: Daneil Dolin, MD;  [facility];  Service: Endoscopy;  Laterality: N/A;  9:10   COLONOSCOPY N/A 03/26/2020   Procedure: COLONOSCOPY;  Surgeon: Daneil Dolin, MD;  [facility];  Service: Endoscopy;  Laterality: N/A;  9:30   HERNIA REPAIR     PROSTATE BIOPSY      Social History   Socioeconomic History   Marital status: Married    Spouse name: Not on file   Number of children: 4   Years of education: 12   Highest education level: 12th grade  Occupational History   Occupation: Retired  Tobacco Use   Smoking status: Former    Packs/day: 2.00    Years: 5.00    Pack years: 10.00    Types: Cigarettes   Smokeless tobacco: Never  Vaping Use   Vaping Use: Never used  Substance and Sexual Activity   Alcohol use: No   Drug use: No   Sexual activity: Not on file  Other Topics Concern   Not  on file  Social History Narrative   Lives with wife alone    Social Determinants of Health   Financial Resource Strain: Not on file  Food Insecurity: No Food Insecurity   Worried About Charity fundraiser in the Last Year: Never true   Arboriculturist in the Last Year: Never true  Transportation Needs: No Transportation Needs   Lack of Transportation (Medical): No   Lack of Transportation (Non-Medical): No  Physical Activity: Sufficiently Active   Days of Exercise per Week: 5 days   Minutes of Exercise per Session: 30 min  Stress: No Stress Concern Present   Feeling of Stress : Not at all  Social Connections: Moderately Integrated   Frequency of Communication with Friends and Family: More than three times a week   Frequency of Social Gatherings with Friends and Family: More than three times a week   Attends Religious Services: More than 4 times per year   Active Member of [employer] or Organizations: No   Attends Archivist Meetings: Never   Marital Status: Married  Human resources officer Violence: Not on file    Family History  Problem  Relation Age of Onset   Hypertension Mother    Cancer Mother        Rectal, approximately 48   Diabetes Father    Alzheimer's disease Father    Liver disease Neg Hx    Colon cancer Neg Hx     Anti-infectives: Anti-infectives (From admission, onward)    None       Current Outpatient Medications  Medication Sig Dispense Refill   alfuzosin (UROXATRAL) 10 MG 24 hr tablet Take 1 tablet (10 mg total) by mouth daily with breakfast. 30 tablet 11   allopurinol (ZYLOPRIM) 300 MG tablet Take 1 tablet (300 mg total) by mouth daily. (Patient taking differently: Take 300 mg by mouth 3 (three) times a week.) 90 tablet 1   amLODipine (NORVASC) 10 MG tablet Take 1 tablet (10 mg total) by mouth daily. 90 tablet 3   benazepril (LOTENSIN) 40 MG tablet Take 1 tablet (40 mg total) by mouth daily. 90 tablet 3   naproxen sodium (ALEVE) 220 MG tablet Take 440 mg by mouth 2 (two) times daily as needed (knee pain.).     No current facility-administered medications for this visit.     Objective: Vital signs in last 24 hours: BP (!) 152/77   Pulse 94   Temp 98.4 F (36.9 C)   Wt 272 lb (123.4 kg)   BMI 34.92 kg/m   Intake/Output from previous day: No intake/output data recorded. Intake/Output this shift: @IOTHISSHIFT @   Physical Exam Vitals reviewed.  Constitutional:      Appearance: Normal appearance. He is obese.  Genitourinary:    Comments: Foley indwelling. Prostate 4+ benign. SV non-palpable Neurological:     Mental Status: He is alert.    Lab Results:  No results found for this or any previous visit (from the past 24 hour(s)).   BMET Recent Results (from the past 2160 hour(s))  Urinalysis, Routine w reflex microscopic Urine, Catheterized     Status: Abnormal   Collection Time: 10/01/21 10:20 AM  Result Value Ref Range   Color, Urine YELLOW YELLOW   APPearance CLEAR CLEAR   Specific Gravity, Urine 1.009 1.005 - 1.030   pH 6.0 5.0 - 8.0   Glucose, UA NEGATIVE NEGATIVE  mg/dL   Hgb urine dipstick MODERATE (A) NEGATIVE   Bilirubin Urine NEGATIVE NEGATIVE  Ketones, ur NEGATIVE NEGATIVE mg/dL   Protein, ur >=300 (A) NEGATIVE mg/dL   Nitrite NEGATIVE NEGATIVE   Leukocytes,Ua NEGATIVE NEGATIVE   RBC / HPF >50 (H) 0 - 5 RBC/hpf   WBC, UA 0-5 0 - 5 WBC/hpf   Bacteria, UA NONE SEEN NONE SEEN    Comment: Performed at [facility], [address] [place], [place] [address]  Basic metabolic panel     Status: Abnormal   Collection Time: 10/02/21 10:28 AM  Result Value Ref Range   Sodium 138 135 - 145 mmol/L   Potassium 4.7 3.5 - 5.1 mmol/L   Chloride 104 98 - 111 mmol/L   CO2 27 22 - 32 mmol/L   Glucose, Bld 131 (H) 70 - 99 mg/dL    Comment: Glucose reference range applies only to samples taken after fasting for at least 8 hours.   BUN 20 8 - 23 mg/dL   Creatinine, Ser 1.53 (H) 0.61 - 1.24 mg/dL   Calcium 9.0 8.9 - 10.3 mg/dL   GFR, Estimated 47 (L) >60 mL/min    Comment: (NOTE) Calculated using the CKD-EPI Creatinine Equation (2021)    Anion gap 7 5 - 15    Comment: Performed at [facility], [address], [place], [place] [address]  CBC with Differential     Status: Abnormal   Collection Time: 10/02/21 10:28 AM  Result Value Ref Range   WBC 9.4 4.0 - 10.5 K/uL   RBC 5.09 4.22 - 5.81 MIL/uL   Hemoglobin 13.2 13.0 - 17.0 g/dL   HCT 40.8 39.0 - 52.0 %   MCV 80.2 80.0 - 100.0 fL   MCH 25.9 (L) 26.0 - 34.0 pg   MCHC 32.4 30.0 - 36.0 g/dL   RDW 14.9 11.5 - 15.5 %   Platelets 243 150 - 400 K/uL   nRBC 0.0 0.0 - 0.2 %   Neutrophils Relative % 80 %   Neutro Abs 7.6 1.7 - 7.7 K/uL   Lymphocytes Relative 12 %   Lymphs Abs 1.1 0.7 - 4.0 K/uL   Monocytes Relative 6 %   Monocytes Absolute 0.6 0.1 - 1.0 K/uL   Eosinophils Relative 1 %   Eosinophils Absolute 0.1 0.0 - 0.5 K/uL   Basophils Relative 0 %   Basophils Absolute 0.0 0.0 - 0.1 K/uL   Immature Granulocytes 1 %   Abs Immature Granulocytes 0.08 (H) 0.00 - 0.07 K/uL    Comment: Performed at [facility], [address] [place], [place] [address]  Urinalysis, Routine w reflex microscopic Urine, Catheterized     Status: Abnormal   Collection Time: 10/02/21 10:48 AM  Result Value Ref Range   Color, Urine AMBER (A) YELLOW   APPearance HAZY (A) CLEAR   Specific Gravity, Urine 1.012 1.005 - 1.030   pH 6.0 5.0 - 8.0   Glucose, UA NEGATIVE NEGATIVE mg/dL   Hgb urine dipstick LARGE (A) NEGATIVE   Bilirubin Urine NEGATIVE NEGATIVE   Ketones, ur NEGATIVE NEGATIVE mg/dL   Protein, ur >=300 (A) NEGATIVE mg/dL   Nitrite NEGATIVE NEGATIVE   Leukocytes,Ua SMALL (A) NEGATIVE   RBC / HPF >50 (H) 0 - 5 RBC/hpf   WBC, UA >50 (H) 0 - 5 WBC/hpf   Bacteria, UA NONE SEEN NONE SEEN    Comment: Performed at [facility], [address], [place], [place] [address]  Urine Culture     Status: None   Collection Time: 10/02/21 10:48 AM   Specimen: Urine, Catheterized  Result  Value Ref Range   Specimen Description      URINE, CATHETERIZED Performed at [facility], [address] [place], [place] [address]    Special Requests      NONE Performed at [facility], [address] [place], [place] [address]    Culture      NO GROWTH Performed at [facility], [place] [address] [place], [place] [address]    Report Status 10/04/2021 FINAL   Urinalysis, Routine w reflex microscopic Urine, Catheterized     Status: Abnormal   Collection Time: 10/09/21 12:25 PM  Result Value Ref Range   Color, Urine YELLOW YELLOW   APPearance HAZY (A) CLEAR   Specific Gravity, Urine 1.011 1.005 - 1.030   pH 6.0 5.0 - 8.0   Glucose, UA NEGATIVE NEGATIVE mg/dL   Hgb urine dipstick LARGE (A) NEGATIVE   Bilirubin Urine NEGATIVE NEGATIVE   Ketones, ur NEGATIVE NEGATIVE mg/dL   Protein, ur 100 (A) NEGATIVE mg/dL   Nitrite NEGATIVE NEGATIVE   Leukocytes,Ua SMALL (A) NEGATIVE   RBC / HPF >50 (H) 0 - 5 RBC/hpf   WBC, UA 11-20 0 - 5 WBC/hpf   Bacteria, UA NONE SEEN NONE SEEN   Squamous Epithelial / LPF 0-5 0 - 5   Mucus  PRESENT    Ca Oxalate Crys, UA PRESENT     Comment: Performed at [facility], [address], [place],  [address]    Studies/Results: Hospital records and labs reviewed.    Assessment/Plan: BPH with recent retention. He passed a voiding trial today.  I will get him started on alfuzosin to help prevent recurrent issues.  Side effects reviewed.  Elevated PSA.  I will get a repeat PSA today and evaluate accordingly.  Gross hematuria.  This was probably secondary to the BPH with acute bladder decompression but I will get a CT hematuria study for further evaluation and at assess prostate size.  He will return for cystoscopy.    Meds ordered this encounter  Medications   alfuzosin (UROXATRAL) 10 MG 24 hr tablet    Sig: Take 1 tablet (10 mg total) by mouth daily with breakfast.    Dispense:  30 tablet    Refill:  11      Orders Placed This Encounter  Procedures   CT HEMATURIA WORKUP    Standing Status:   Future    Standing Expiration Date:   10/15/2022    Order Specific Question:   Reason for Exam (SYMPTOM  OR DIAGNOSIS REQUIRED)    Answer:   gross hematuria    Order Specific Question:   Preferred imaging location?    Answer:   [facility]    Order Specific Question:   Radiology Contrast Protocol - do NOT remove file path    Answer:   [email or web address]   Basic metabolic panel   PSA, total and free      Return for Next available for cystoscopy with CT results. .    CC: Dr. Tula Nakayama.      Irine Seal 10/15/2021 [phone] ID: Carlos Leon, Carlos Leon   DOB: 1946/02/10, 75 y.o.   MRN: [id number]

## 2021-10-15 NOTE — Progress Notes (Signed)
Urological Symptom Review  Patient is experiencing the following symptoms: Frequent urination Hard to postpone urination Trouble starting stream Have to strain to urinate   Review of Systems  Gastrointestinal (upper)  : Negative for upper GI symptoms  Gastrointestinal (lower) : Negative for lower GI symptoms  Constitutional : Negative for symptoms  Skin: Negative for skin symptoms  Eyes: Negative for eye symptoms  Ear/Nose/Throat : Negative for Ear/Nose/Throat symptoms  Hematologic/Lymphatic: Negative for Hematologic/Lymphatic symptoms  Cardiovascular : Negative for cardiovascular symptoms  Respiratory : Negative for respiratory symptoms  Endocrine: Negative for endocrine symptoms  Musculoskeletal: Negative for musculoskeletal symptoms  Neurological: Negative for neurological symptoms  Psychologic: Negative for psychiatric symptoms

## 2021-10-15 NOTE — Progress Notes (Signed)
Fill and Pull Catheter Removal  Patient is present today for a catheter removal.  Patient was cleaned and prepped in a sterile fashion 100 ml of sterile water/ saline was instilled into the bladder when the patient felt the urge to urinate. 61ml of water was then drained from the balloon.  A 16FR foley cath was removed from the bladder no complications were noted .  Patient as then given some time to void on their own.  Patient can void  130ml on their own after some time.  Patient tolerated well.  Performed by: Chike Farrington LPN  Follow up/ Additional notes: Per MD note

## 2021-10-16 ENCOUNTER — Telehealth: Payer: Self-pay | Admitting: *Deleted

## 2021-10-16 ENCOUNTER — Encounter (HOSPITAL_COMMUNITY): Payer: Self-pay | Admitting: Emergency Medicine

## 2021-10-16 ENCOUNTER — Other Ambulatory Visit: Payer: Self-pay

## 2021-10-16 ENCOUNTER — Emergency Department (HOSPITAL_COMMUNITY)
Admission: EM | Admit: 2021-10-16 | Discharge: 2021-10-16 | Disposition: A | Discharge status: Home / Self Care | Payer: Medicare HMO | Attending: Emergency Medicine | Admitting: Emergency Medicine

## 2021-10-16 DIAGNOSIS — Z87891 Personal history of nicotine dependence: Secondary | ICD-10-CM | POA: Diagnosis not present

## 2021-10-16 DIAGNOSIS — T83511A Infection and inflammatory reaction due to indwelling urethral catheter, initial encounter: Secondary | ICD-10-CM | POA: Diagnosis not present

## 2021-10-16 DIAGNOSIS — Z79899 Other long term (current) drug therapy: Secondary | ICD-10-CM | POA: Diagnosis not present

## 2021-10-16 DIAGNOSIS — I10 Essential (primary) hypertension: Secondary | ICD-10-CM | POA: Diagnosis not present

## 2021-10-16 DIAGNOSIS — Y846 Urinary catheterization as the cause of abnormal reaction of the patient, or of later complication, without mention of misadventure at the time of the procedure: Secondary | ICD-10-CM | POA: Insufficient documentation

## 2021-10-16 DIAGNOSIS — R339 Retention of urine, unspecified: Secondary | ICD-10-CM

## 2021-10-16 DIAGNOSIS — N390 Urinary tract infection, site not specified: Secondary | ICD-10-CM

## 2021-10-16 LAB — BASIC METABOLIC PANEL
BUN/Creatinine Ratio: 10 (ref 10–24)
BUN: 16 mg/dL (ref 8–27)
CO2: 24 mmol/L (ref 20–29)
Calcium: 9.3 mg/dL (ref 8.6–10.2)
Chloride: 104 mmol/L (ref 96–106)
Creatinine, Ser: 1.55 mg/dL — ABNORMAL HIGH (ref 0.76–1.27)
Glucose: 117 mg/dL — ABNORMAL HIGH (ref 70–99)
Potassium: 4.7 mmol/L (ref 3.5–5.2)
Sodium: 139 mmol/L (ref 134–144)
eGFR: 46 mL/min/{1.73_m2} — ABNORMAL LOW (ref >59)

## 2021-10-16 LAB — URINALYSIS, ROUTINE W REFLEX MICROSCOPIC
Bilirubin Urine: NEGATIVE
Glucose, UA: NEGATIVE mg/dL
Ketones, ur: NEGATIVE mg/dL
Nitrite: POSITIVE — AB
Protein, ur: 100 mg/dL — AB
Specific Gravity, Urine: 1.015 (ref 1.005–1.030)
pH: 7 (ref 5.0–8.0)

## 2021-10-16 LAB — URINALYSIS, MICROSCOPIC (REFLEX)
RBC / HPF: >50 RBC/hpf (ref 0–5)
WBC, UA: >50 WBC/hpf (ref 0–5)

## 2021-10-16 LAB — PSA, TOTAL AND FREE
PSA, Free Pct: 23.1 %
PSA, Free: 5.83 ng/mL
Prostate Specific Ag, Serum: 25.2 ng/mL — ABNORMAL HIGH (ref 0.0–4.0)

## 2021-10-16 MED ORDER — CEPHALEXIN 500 MG PO CAPS: 500.0000 mg | ORAL_CAPSULE | Freq: Four times a day (QID) | ORAL | 0 refills | Status: DC

## 2021-10-16 MED ORDER — SODIUM CHLORIDE 0.9 % IV SOLN
1.0000 g | Freq: Once | INTRAVENOUS | Status: AC
  Administered 2021-10-16: 1 g via INTRAVENOUS
  Filled 2021-10-16: qty 10

## 2021-10-16 NOTE — Telephone Encounter (Signed)
Spoke with patient about scheduling DRS. Patient was in pain due to procedure 10/15/2021 with Urology. Advised patient to call Urology as patient stated they told him they may have to put catheter back in. Patient did not want to schedule DRS right now.

## 2021-10-16 NOTE — ED Triage Notes (Signed)
Pt reports pressure in his lower abdomen. Pt reports having foley removed yesterday. Pt reports his last "full urination" was last night.

## 2021-10-16 NOTE — Discharge Instructions (Addendum)
Your Foley catheter has been replaced.  Your urine test today shows an infection.  You have been given a dose of antibiotics here through an IV.  It is important that you take the cephalexin as directed until its finished.  Please call your urologist this afternoon to schedule a follow-up appointment.  Return to the emergency department for any new or worsening symptoms.

## 2021-10-16 NOTE — ED Provider Notes (Signed)
[facility] EMERGENCY DEPARTMENT Provider Note   CSN: [id number] Arrival date & time: 10/16/21  0830     History Chief Complaint  Patient presents with   Urinary Retention    Carlos Leon is a 75 y.o. male.  HPI      Carlos Leon is a 75 y.o. male with past medical history of atrial fibrillation, hypertension, prediabetes, and enlarged prostate who presents to the Emergency Department complaining of urinary retention.  He was seen here in this emergency department on 10/01/2021 for acute urinary retention and Foley catheter was placed.  He had follow-up appointment with his urologist yesterday for removal of his Foley catheter.  Catheter was removed without complications patient states he was voiding well throughout the day yesterday.  Having difficulty last evening at bedtime.  Unable to void this morning.  He also endorses having some lower abdominal discomfort and "pressure."  Felt as though he had fever and chills last evening but none this morning.  He denies any vomiting, back pain, flank pain, diarrhea or chest pain shortness of breath.   Past Medical History:  Diagnosis Date   Arthritis    Atrial fibrillation ([place])    Present by ECG 01/2012   Colon polyps    Diverticulosis of colon    Erectile dysfunction    Essential hypertension    Gout    Hyperlipidemia    Metabolic syndrome X 06/03/6719   Obesity     Patient Active Problem List   Diagnosis Date Noted   Encounter for Medicare annual examination with abnormal findings 11/25/2020   Refused H1N1 influenza virus immunization 09/17/2020   Hearing loss 11/25/2019   IDA (iron deficiency anemia) 11/16/2018   Poor urinary stream 09/30/2018   Asymptomatic proteinuria 08/08/2013   Elevated PSA 11/19/2012   Atrial fibrillation ([place]) 02/07/2012   Tubular adenoma of colon 10/27/2011   Hyperlipemia 08/26/2009   Morbid obesity ([address]) 04/18/2009   Prediabetes 10/21/2008   GOUT, UNSPECIFIED 10/21/2008   Essential  hypertension, benign 10/21/2008    Past Surgical History:  Procedure Laterality Date   Colon polyps removed  2002   Dr. Tamala Julian   COLONOSCOPY  2004   Dr. Misty Stanley prep, ACBE showed diverticulosis   COLONOSCOPY  11/24/2011   Procedure: COLONOSCOPY;  Surgeon: Daneil Dolin, MD;  [facility];  Service: Endoscopy;  Laterality: N/A;  9:10   COLONOSCOPY N/A 03/26/2020   Procedure: COLONOSCOPY;  Surgeon: Daneil Dolin, MD;  [facility];  Service: Endoscopy;  Laterality: N/A;  9:30   HERNIA REPAIR     PROSTATE BIOPSY         Family History  Problem Relation Age of Onset   Hypertension Mother    Cancer Mother        Rectal, approximately 40   Diabetes Father    Alzheimer's disease Father    Liver disease Neg Hx    Colon cancer Neg Hx     Social History   Tobacco Use   Smoking status: Former    Packs/day: 2.00    Years: 5.00    Pack years: 10.00    Types: Cigarettes   Smokeless tobacco: Never  Vaping Use   Vaping Use: Never used  Substance Use Topics   Alcohol use: No   Drug use: No    Home Medications Prior to Admission medications   Medication Sig Start Date End Date Taking? Authorizing Provider  alfuzosin (UROXATRAL) 10 MG 24 hr tablet Take 1 tablet (10  mg total) by mouth daily with breakfast. 10/15/21   Irine Seal, MD  allopurinol (ZYLOPRIM) 300 MG tablet Take 1 tablet (300 mg total) by mouth daily. Patient taking differently: Take 300 mg by mouth 3 (three) times a week. 07/07/21   [name], MD  amLODipine (NORVASC) 10 MG tablet Take 1 tablet (10 mg total) by mouth daily. 07/20/21   [name], MD  benazepril (LOTENSIN) 40 MG tablet Take 1 tablet (40 mg total) by mouth daily. 07/20/21   [name], MD  naproxen sodium (ALEVE) 220 MG tablet Take 440 mg by mouth 2 (two) times daily as needed (knee pain.).    [provider]    Allergies    Chlorthalidone  Review of Systems   Review of Systems   Constitutional:  Positive for chills and fever. Negative for fatigue.  HENT:  Negative for trouble swallowing.   Respiratory:  Negative for cough, shortness of breath and wheezing.   Cardiovascular:  Negative for chest pain.  Gastrointestinal:  Positive for abdominal pain and nausea. Negative for blood in stool and vomiting.  Genitourinary:  Positive for difficulty urinating. Negative for dysuria, flank pain, hematuria, penile discharge, penile pain, scrotal swelling and testicular pain.  Musculoskeletal:  Negative for arthralgias, back pain, myalgias, neck pain and neck stiffness.  Skin:  Negative for rash.  Neurological:  Negative for dizziness, weakness, numbness and headaches.  Hematological:  Does not bruise/bleed easily.   Physical Exam Updated Vital Signs BP 140/86   Pulse 100   Temp 98.6 F (37 C) (Oral)   Resp 20   Ht 6\' 1"  (1.854 m)   Wt 123.7 kg   SpO2 98%   BMI 35.99 kg/m   Physical Exam Vitals and nursing note reviewed.  Constitutional:      General: He is not in acute distress.    Appearance: Normal appearance. He is not ill-appearing.  HENT:     Head: Normocephalic.  Cardiovascular:     Rate and Rhythm: Normal rate and regular rhythm.     Pulses: Normal pulses.  Pulmonary:     Effort: Pulmonary effort is normal.     Breath sounds: Normal breath sounds. No wheezing.  Abdominal:     General: There is distension.     Palpations: Abdomen is soft.     Tenderness: There is abdominal tenderness (Suprapubic tenderness on exam.  No CVA tenderness). There is no right CVA tenderness, left CVA tenderness, guarding or rebound.  Musculoskeletal:        General: Normal range of motion.     Right lower leg: No edema.     Left lower leg: No edema.  Skin:    General: Skin is warm.     Capillary Refill: Capillary refill takes less than 2 seconds.     Findings: No rash.  Neurological:     General: No focal deficit present.     Mental Status: He is alert.     Sensory:  No sensory deficit.     Motor: No weakness.    ED Results / Procedures / Treatments   Labs (all labs ordered are listed, but only abnormal results are displayed) Labs Reviewed  URINALYSIS, ROUTINE W REFLEX MICROSCOPIC - Abnormal; Notable for the following components:      Result Value   Color, Urine BROWN (*)    APPearance TURBID (*)    Hgb urine dipstick LARGE (*)    Protein, ur 100 (*)    Nitrite POSITIVE (*)  Leukocytes,Ua SMALL (*)    All other components within normal limits  URINALYSIS, MICROSCOPIC (REFLEX) - Abnormal; Notable for the following components:   Bacteria, UA MANY (*)    All other components within normal limits  URINE CULTURE    EKG None  Radiology No results found.  Procedures Procedures   Medications Ordered in ED Medications  cefTRIAXone (ROCEPHIN) 1 g in sodium chloride 0.9 % 100 mL IVPB (0 g Intravenous Stopped 10/16/21 1350)    ED Course  I have reviewed the triage vital signs and the nursing notes.  Pertinent labs & imaging results that were available during my care of the patient were reviewed by me and considered in my medical decision making (see chart for details).    MDM Rules/Calculators/A&P                            Pt here for acute urinary retention has foley catheter in place.      Pt bladder scanned, 614 cc urine documented by nursing. Lower abd distended and he has tenderness and fullness of the suprapubic area   Foley catheter placed, 1400 cc bloody urine out.  Pain completely resolved.  Foley appears to be draining well.  Pt is well appearing, non toxic.    On recheck, pt resting comfortably.  Vitals improved.  Additional 400 cc urine out.  Foley cath has been irrigated.  No blood clots. Has completed IV rocephin w/o difficulty.   No concerning sx's for urosepsis.  Urine culture pending.   Will start pt on Keflex.  He agrees to close f/u with urology.  Leg bag placed. Return precautions discussed.    Final Clinical  Impression(s) / ED Diagnoses Final diagnoses:  Urinary retention  Urinary tract infection associated with catheterization of urinary tract, unspecified indwelling urinary catheter type, initial encounter [facility])    Rx / [place] Orders ED Discharge Orders     None        Bufford Lope 10/18/21 2100    Godfrey Pick, MD 10/20/21 1747

## 2021-10-16 NOTE — ED Notes (Signed)
Bladder scan showed 666ml.

## 2021-10-16 NOTE — ED Notes (Signed)
Emptied pts foley bag, it is very bloody and has blood clots in urine. Pt output was 1281mls. Nurse notified.

## 2021-10-17 ENCOUNTER — Emergency Department (HOSPITAL_COMMUNITY)
Admission: EM | Admit: 2021-10-17 | Discharge: 2021-10-18 | Disposition: A | Discharge status: Home / Self Care | Payer: Medicare HMO | Attending: Emergency Medicine | Admitting: Emergency Medicine

## 2021-10-17 ENCOUNTER — Other Ambulatory Visit: Payer: Self-pay

## 2021-10-17 ENCOUNTER — Encounter (HOSPITAL_COMMUNITY): Payer: Self-pay | Admitting: Emergency Medicine

## 2021-10-17 DIAGNOSIS — X58XXXA Exposure to other specified factors, initial encounter: Secondary | ICD-10-CM | POA: Insufficient documentation

## 2021-10-17 DIAGNOSIS — I10 Essential (primary) hypertension: Secondary | ICD-10-CM | POA: Insufficient documentation

## 2021-10-17 DIAGNOSIS — Z79899 Other long term (current) drug therapy: Secondary | ICD-10-CM | POA: Diagnosis not present

## 2021-10-17 DIAGNOSIS — N390 Urinary tract infection, site not specified: Secondary | ICD-10-CM | POA: Diagnosis not present

## 2021-10-17 DIAGNOSIS — Z87891 Personal history of nicotine dependence: Secondary | ICD-10-CM | POA: Insufficient documentation

## 2021-10-17 DIAGNOSIS — T83098A Other mechanical complication of other indwelling urethral catheter, initial encounter: Secondary | ICD-10-CM | POA: Insufficient documentation

## 2021-10-17 DIAGNOSIS — T839XXA Unspecified complication of genitourinary prosthetic device, implant and graft, initial encounter: Secondary | ICD-10-CM

## 2021-10-17 NOTE — ED Notes (Signed)
Irrigated old foley unsuccessfully. Pt was in severe pain with only 5cc  with attempt to irrigate.

## 2021-10-17 NOTE — Discharge Instructions (Addendum)
You were seen today for evaluation of a clogged Foley catheter.  This was successfully replaced in the ER today.  As discussed, it is very important that you are consuming 6-8 bottles of water per day to ensure that your catheter stays flowing with urine and does not get clogged with sediment.  These continue to take your antibiotics as prescribed for your urinary tract infection and call urology on Monday for further evaluation and removal of this catheter.

## 2021-10-17 NOTE — ED Provider Notes (Signed)
[facility] EMERGENCY DEPARTMENT Provider Note   CSN: [id number] Arrival date & time: 10/17/21  2213     History Chief Complaint  Patient presents with   Urinary Retention    Carlos Leon is a 75 y.o. male.  Patient with history of urinary retention presents today with foley bag not draining. Patient was seen at [facility] yesterday, diagnosed with urinary retention and UTI, given foley, Keflex, and instructions to follow-up with Urology for foley removal. He states that the bag stopped draining around 1pm today, has been non-productive of urine since. Wife attempted to drain bag, however was unsuccessful. Patient feels like he needs to urinate, however is unable to do so. Denies other symptoms, no fevers, chills, shortness of breath, chest pain, abdominal pain, flank pain, hematuria. Of note, patient states that he only drinks approximately 2 bottles of water a day.   The history is provided by the patient. No language interpreter was used.      Past Medical History:  Diagnosis Date   Arthritis    Atrial fibrillation ([place])    Present by ECG 01/2012   Colon polyps    Diverticulosis of colon    Erectile dysfunction    Essential hypertension    Gout    Hyperlipidemia    Metabolic syndrome X 12/30/9700   Obesity     Patient Active Problem List   Diagnosis Date Noted   Encounter for Medicare annual examination with abnormal findings 11/25/2020   Refused H1N1 influenza virus immunization 09/17/2020   Hearing loss 11/25/2019   IDA (iron deficiency anemia) 11/16/2018   Poor urinary stream 09/30/2018   Asymptomatic proteinuria 08/08/2013   Elevated PSA 11/19/2012   Atrial fibrillation ([place]) 02/07/2012   Tubular adenoma of colon 10/27/2011   Hyperlipemia 08/26/2009   Morbid obesity ([place]) 04/18/2009   Prediabetes 10/21/2008   GOUT, UNSPECIFIED 10/21/2008   Essential hypertension, benign 10/21/2008    Past Surgical History:  Procedure Laterality Date   Colon polyps  removed  2002   Dr. Tamala Julian   COLONOSCOPY  2004   Dr. Misty Stanley prep, ACBE showed diverticulosis   COLONOSCOPY  11/24/2011   Procedure: COLONOSCOPY;  Surgeon: Daneil Dolin, MD;  Location: [facility];  Service: Endoscopy;  Laterality: N/A;  9:10   COLONOSCOPY N/A 03/26/2020   Procedure: COLONOSCOPY;  Surgeon: Daneil Dolin, MD;  Location: [facility];  Service: Endoscopy;  Laterality: N/A;  9:30   HERNIA REPAIR     PROSTATE BIOPSY         Family History  Problem Relation Age of Onset   Hypertension Mother    Cancer Mother        Rectal, approximately 48   Diabetes Father    Alzheimer's disease Father    Liver disease Neg Hx    Colon cancer Neg Hx     Social History   Tobacco Use   Smoking status: Former    Packs/day: 2.00    Years: 5.00    Pack years: 10.00    Types: Cigarettes   Smokeless tobacco: Never  Vaping Use   Vaping Use: Never used  Substance Use Topics   Alcohol use: No   Drug use: No    Home Medications Prior to Admission medications   Medication Sig Start Date End Date Taking? Authorizing Provider  alfuzosin (UROXATRAL) 10 MG 24 hr tablet Take 1 tablet (10 mg total) by mouth daily with breakfast. 10/15/21   Irine Seal, MD  allopurinol (ZYLOPRIM) 300 MG  tablet Take 1 tablet (300 mg total) by mouth daily. Patient taking differently: Take 300 mg by mouth 3 (three) times a week. 07/07/21   [name], MD  amLODipine (NORVASC) 10 MG tablet Take 1 tablet (10 mg total) by mouth daily. 07/20/21   [name], MD  benazepril (LOTENSIN) 40 MG tablet Take 1 tablet (40 mg total) by mouth daily. 07/20/21   [name], MD  cephALEXin (KEFLEX) 500 MG capsule Take 1 capsule (500 mg total) by mouth 4 (four) times daily. 10/16/21   Triplett, Tammy, PA-C  naproxen sodium (ALEVE) 220 MG tablet Take 440 mg by mouth 2 (two) times daily as needed (knee pain.).    [provider]    Allergies    Chlorthalidone  Review of Systems    Review of Systems  Constitutional:  Negative for chills, fatigue and fever.  Respiratory:  Negative for shortness of breath.   Cardiovascular:  Negative for chest pain and leg swelling.  Gastrointestinal:  Negative for abdominal distention, abdominal pain, diarrhea, nausea and vomiting.  Genitourinary:  Positive for decreased urine volume and difficulty urinating. Negative for dysuria, flank pain, hematuria, penile discharge, penile pain, penile swelling, scrotal swelling and testicular pain.  Musculoskeletal:  Negative for back pain.  Skin:  Negative for wound.  Neurological:  Negative for seizures, syncope, speech difficulty, light-headedness, numbness and headaches.  Psychiatric/Behavioral:  Negative for confusion and decreased concentration.   All other systems reviewed and are negative.  Physical Exam Updated Vital Signs BP 106/70 (BP Location: Right Arm)   Pulse (!) 120   Temp 99.5 F (37.5 C) (Oral)   Resp 17   SpO2 96%   Physical Exam Vitals and nursing note reviewed. Exam conducted with a chaperone present.  Constitutional:      General: He is not in acute distress.    Appearance: Normal appearance. He is normal weight. He is not ill-appearing, toxic-appearing or diaphoretic.  HENT:     Head: Normocephalic and atraumatic.  Cardiovascular:     Rate and Rhythm: Normal rate.  Pulmonary:     Effort: Pulmonary effort is normal. No respiratory distress.  Abdominal:     General: Abdomen is flat. Bowel sounds are normal. There is no distension.     Palpations: Abdomen is soft.     Tenderness: There is no abdominal tenderness.  Genitourinary:    Comments: Foley appears properly placed with empty leg bag. Musculoskeletal:        General: Normal range of motion.     Cervical back: Normal range of motion.  Skin:    General: Skin is warm and dry.  Neurological:     General: No focal deficit present.     Mental Status: He is alert.  Psychiatric:        Mood and Affect:  Mood normal.        Behavior: Behavior normal.    ED Results / Procedures / Treatments   Labs (all labs ordered are listed, but only abnormal results are displayed) Labs Reviewed - No data to display  EKG None  Radiology No results found.  Procedures Procedures   Medications Ordered in ED Medications - No data to display  ED Course  I have reviewed the triage vital signs and the nursing notes.  Pertinent labs & imaging results that were available during my care of the patient were reviewed by me and considered in my medical decision making (see chart for details).    MDM  Rules/Calculators/A&P                         Patient presents today with foley bag that stopped draining at 1 pm today. Foley placed here yesterday. He feels the need to urinate but is unable to do so. Bladder scan 0, however bedside US revealed large void. Nurses attempted to flush catheter without success, will replace.   Patient awaiting foley catheter replacement at shift change, patient has no other concerns. Began antibiotic treatment for UTI today without issue, no fevers or CVA tenderness. Patient has plans to call urology Monday for foley removal and further evaluation. He is afebrile, non-toxic appearing, and in no acute distress. Plan for discharge following foley catheter change.  Care handoff to Thayer Jew, MD at shift change.   This is a shared visit with supervising physician Dr. Sabra Heck who has independently evaluated patient & provided guidance in evaluation/management/disposition, in agreement with care   Final Clinical Impression(s) / ED Diagnoses Final diagnoses:  Problem with Foley catheter, initial encounter [facility])    Rx / DC Orders ED Discharge Orders     None        Nestor Lewandowsky 10/17/21 2310    Noemi Chapel, MD 10/18/21 [phone]

## 2021-10-17 NOTE — ED Triage Notes (Addendum)
Pt arrives to ER POV. Pt had new foley placed yesterday in [place]. Pt c/o foley being clogged. No urine has produced sine 1pm this afternoon. Pt tried to flush but had no effect.

## 2021-10-19 ENCOUNTER — Telehealth: Payer: Self-pay

## 2021-10-19 LAB — URINE CULTURE: Culture: >=100000 — AB

## 2021-10-19 NOTE — Telephone Encounter (Signed)
Patient previously called office this morning to schedule f/u.

## 2021-10-19 NOTE — Telephone Encounter (Signed)
-----   Message from Irine Seal, MD sent at 10/18/2021  8:29 AM EDT ----- His chemistries are stable with moderate renal insufficiency.  His PSA is up to 25 from 16.  He had to return to the ED for recurrent retention over the weekend.  He will need f/u sooner than his December appointment.   See if we can get him in in the next 2-3 weeks.  ----- Message ----- From: Iris Pert, LPN Sent: 57/32/2567   8:09 AM EDT To: Irine Seal, MD  Please review

## 2021-10-22 ENCOUNTER — Ambulatory Visit: Payer: Medicare HMO

## 2021-10-29 ENCOUNTER — Ambulatory Visit: Payer: Medicare HMO | Admitting: Urology

## 2021-10-29 ENCOUNTER — Other Ambulatory Visit: Payer: Self-pay

## 2021-10-29 ENCOUNTER — Encounter: Payer: Self-pay | Admitting: Urology

## 2021-10-29 VITALS — BP 149/79 | HR 82 | Temp 98.3°F | Wt 266.2 lb

## 2021-10-29 DIAGNOSIS — Z87440 Personal history of urinary (tract) infections: Secondary | ICD-10-CM | POA: Diagnosis not present

## 2021-10-29 DIAGNOSIS — N401 Enlarged prostate with lower urinary tract symptoms: Secondary | ICD-10-CM | POA: Diagnosis not present

## 2021-10-29 DIAGNOSIS — N138 Other obstructive and reflux uropathy: Secondary | ICD-10-CM | POA: Diagnosis not present

## 2021-10-29 DIAGNOSIS — R310 Gross hematuria: Secondary | ICD-10-CM

## 2021-10-29 DIAGNOSIS — R972 Elevated prostate specific antigen [PSA]: Secondary | ICD-10-CM | POA: Diagnosis not present

## 2021-10-29 DIAGNOSIS — R339 Retention of urine, unspecified: Secondary | ICD-10-CM

## 2021-10-29 MED ORDER — FINASTERIDE 5 MG PO TABS: 5.0000 mg | ORAL_TABLET | Freq: Every day | ORAL | 3 refills | Status: DC

## 2021-10-29 NOTE — Progress Notes (Signed)
Subjective: 1. BPH with urinary obstruction   2. Urinary retention   3. Gross hematuria   4. Elevated PSA   5. Personal history of urinary infection       02/19/21: Carlos Leon is a 75 yo male who is sent by Dr. Moshe Cipro for an elevated PSA that was 12.8 on 11/19/19 and 16.1 on 11/24/20.   I saw him in 2015 for a PSA of 9.28 and recommended a biopsy.  I had seen him in 2013 as well with a PSA of 7.0 and recommended a biopsy.  His PSA in 2012 was 5.99.  He reports that he had a biopsy done at [facility] about 5 years ago.  The biopsy was negative.  He is voiding with moderate LUTS and an IPSS of 18/2.   He has nocturia x 3 and some frequency and urgency.  He has had no hematuria or dysuria.  I have reviewed the records and labs from his ER visit.   10/15/21: Carlos Leon returns today in f/u.  He was seen in the ER on 10/6 and was found to be in retention with 1049m PVR.  He has moderate LUTS with nocturia x3 and the retention was sudden in onset.  He has known BPH with a high PSA but negative prior biopsy.  HIs last PSA was 16.1 but he never returned for the MRI that I ordered.  He had >50 RBC's but not gross hematuria.  He returned to the ER the next day for gross hematuria and had to have the catheter irrigated.   A culture was negative but he was started on keflex and the UA had >50 WBC.  The urine is now clear for the past week.  He is on no BPH meds.   10/29/21: Mr. HGrootreturns today in f/u.  He had to return to the ER after his visit on 10/20 for foley replacement.  The PSA done on 10/15/21 was up to 25 from 16.  He had Klebsiella on a culture from 10/16/21.   He has completed the antibiotic he was given and is doing well with the foley now.   He remains on alfuzosin.  He hasn't had the CT hematuria study that I ordered.     ROS:  ROS  Allergies  Allergen Reactions   Chlorthalidone Rash    Past Medical History:  Diagnosis Date   Arthritis    Atrial fibrillation (HPace    Present by  ECG 01/2012   Colon polyps    Diverticulosis of colon    Erectile dysfunction    Essential hypertension    Gout    Hyperlipidemia    Metabolic syndrome X 54/05/8031  Obesity     Past Surgical History:  Procedure Laterality Date   Colon polyps removed  2002   Dr. STamala Julian  COLONOSCOPY  2004   Dr. RMisty Stanleyprep, ACBE showed diverticulosis   COLONOSCOPY  11/24/2011   Procedure: COLONOSCOPY;  Surgeon: RDaneil Dolin MD;  [facility];  Service: Endoscopy;  Laterality: N/A;  9:10   COLONOSCOPY N/A 03/26/2020   Procedure: COLONOSCOPY;  Surgeon: RDaneil Dolin MD;  [facility];  Service: Endoscopy;  Laterality: N/A;  9:30   HERNIA REPAIR     PROSTATE BIOPSY      Social History   Socioeconomic History   Marital status: Married    Spouse name: Not on file   Number of children: 4   Years of education: 119  Highest education level: 12th grade  Occupational History   Occupation: Retired  Tobacco Use   Smoking status: Former    Packs/day: 2.00    Years: 5.00    Pack years: 10.00    Types: Cigarettes   Smokeless tobacco: Never  Vaping Use   Vaping Use: Never used  Substance and Sexual Activity   Alcohol use: No   Drug use: No   Sexual activity: Not on file  Other Topics Concern   Not on file  Social History Narrative   Lives with wife alone    Social Determinants of Health   Financial Resource Strain: Not on file  Food Insecurity: No Food Insecurity   Worried About Charity fundraiser in the Last Year: Never true   [place] in the Last Year: Never true  Transportation Needs: No Transportation Needs   Lack of Transportation (Medical): No   Lack of Transportation (Non-Medical): No  Physical Activity: Sufficiently Active   Days of Exercise per Week: 5 days   Minutes of Exercise per Session: 30 min  Stress: No Stress Concern Present   Feeling of Stress : Not at all  Social Connections: Moderately Integrated   Frequency of Communication  with Friends and Family: More than three times a week   Frequency of Social Gatherings with Friends and Family: More than three times a week   Attends Religious Services: More than 4 times per year   Active Member of [employer] or Organizations: No   Attends Archivist Meetings: Never   Marital Status: Married  Human resources officer Violence: Not on file    Family History  Problem Relation Age of Onset   Hypertension Mother    Cancer Mother        Rectal, approximately 49   Diabetes Father    Alzheimer's disease Father    Liver disease Neg Hx    Colon cancer Neg Hx     Anti-infectives: Anti-infectives (From admission, onward)    None       Current Outpatient Medications  Medication Sig Dispense Refill   finasteride (PROSCAR) 5 MG tablet Take 1 tablet (5 mg total) by mouth daily. 90 tablet 3   alfuzosin (UROXATRAL) 10 MG 24 hr tablet Take 1 tablet (10 mg total) by mouth daily with breakfast. 30 tablet 11   allopurinol (ZYLOPRIM) 300 MG tablet Take 1 tablet (300 mg total) by mouth daily. (Patient taking differently: Take 300 mg by mouth 3 (three) times a week.) 90 tablet 1   amLODipine (NORVASC) 10 MG tablet Take 1 tablet (10 mg total) by mouth daily. 90 tablet 3   benazepril (LOTENSIN) 40 MG tablet Take 1 tablet (40 mg total) by mouth daily. 90 tablet 3   cephALEXin (KEFLEX) 500 MG capsule Take 1 capsule (500 mg total) by mouth 4 (four) times daily. 28 capsule 0   naproxen sodium (ALEVE) 220 MG tablet Take 440 mg by mouth 2 (two) times daily as needed (knee pain.).     No current facility-administered medications for this visit.     Objective: Vital signs in last 24 hours: BP (!) 149/79   Pulse 82   Temp 98.3 F (36.8 C)   Wt 266 lb 3.2 oz (120.7 kg)   BMI 35.12 kg/m   Intake/Output from previous day: No intake/output data recorded. Intake/Output this shift: @IOTHISSHIFT @   Physical Exam  Lab Results:  No results found for this or any previous visit (from  the past 24  hour(s)).   BMET Recent Results (from the past 2160 hour(s))  Urinalysis, Routine w reflex microscopic Urine, Catheterized     Status: Abnormal   Collection Time: 10/01/21 10:20 AM  Result Value Ref Range   Color, Urine YELLOW YELLOW   APPearance CLEAR CLEAR   Specific Gravity, Urine 1.009 1.005 - 1.030   pH 6.0 5.0 - 8.0   Glucose, UA NEGATIVE NEGATIVE mg/dL   Hgb urine dipstick MODERATE (A) NEGATIVE   Bilirubin Urine NEGATIVE NEGATIVE   Ketones, ur NEGATIVE NEGATIVE mg/dL   Protein, ur >=300 (A) NEGATIVE mg/dL   Nitrite NEGATIVE NEGATIVE   Leukocytes,Ua NEGATIVE NEGATIVE   RBC / HPF >50 (H) 0 - 5 RBC/hpf   WBC, UA 0-5 0 - 5 WBC/hpf   Bacteria, UA NONE SEEN NONE SEEN    Comment: Performed at [facility], [address] [place], [place] [address]  Basic metabolic panel     Status: Abnormal   Collection Time: 10/02/21 10:28 AM  Result Value Ref Range   Sodium 138 135 - 145 mmol/L   Potassium 4.7 3.5 - 5.1 mmol/L   Chloride 104 98 - 111 mmol/L   CO2 27 22 - 32 mmol/L   Glucose, Bld 131 (H) 70 - 99 mg/dL    Comment: Glucose reference range applies only to samples taken after fasting for at least 8 hours.   BUN 20 8 - 23 mg/dL   Creatinine, Ser 1.53 (H) 0.61 - 1.24 mg/dL   Calcium 9.0 8.9 - 10.3 mg/dL   GFR, Estimated 47 (L) >60 mL/min    Comment: (NOTE) Calculated using the CKD-EPI Creatinine Equation (2021)    Anion gap 7 5 - 15    Comment: Performed at [facility], [address] [place], [place] [address]  CBC with Differential     Status: Abnormal   Collection Time: 10/02/21 10:28 AM  Result Value Ref Range   WBC 9.4 4.0 - 10.5 K/uL   RBC 5.09 4.22 - 5.81 MIL/uL   Hemoglobin 13.2 13.0 - 17.0 g/dL   HCT 40.8 39.0 - 52.0 %   MCV 80.2 80.0 - 100.0 fL   MCH 25.9 (L) 26.0 - 34.0 pg   MCHC 32.4 30.0 - 36.0 g/dL   RDW 14.9 11.5 - 15.5 %   Platelets 243 150 - 400 K/uL   nRBC 0.0 0.0 - 0.2 %   Neutrophils Relative % 80 %   Neutro Abs 7.6 1.7 - 7.7 K/uL    Lymphocytes Relative 12 %   Lymphs Abs 1.1 0.7 - 4.0 K/uL   Monocytes Relative 6 %   Monocytes Absolute 0.6 0.1 - 1.0 K/uL   Eosinophils Relative 1 %   Eosinophils Absolute 0.1 0.0 - 0.5 K/uL   Basophils Relative 0 %   Basophils Absolute 0.0 0.0 - 0.1 K/uL   Immature Granulocytes 1 %   Abs Immature Granulocytes 0.08 (H) 0.00 - 0.07 K/uL    Comment: Performed at [facility], [address] [place], [place] [address]  Urinalysis, Routine w reflex microscopic Urine, Catheterized     Status: Abnormal   Collection Time: 10/02/21 10:48 AM  Result Value Ref Range   Color, Urine AMBER (A) YELLOW   APPearance HAZY (A) CLEAR   Specific Gravity, Urine 1.012 1.005 - 1.030   pH 6.0 5.0 - 8.0   Glucose, UA NEGATIVE NEGATIVE mg/dL   Hgb urine dipstick LARGE (A) NEGATIVE   Bilirubin Urine NEGATIVE NEGATIVE   Ketones, ur NEGATIVE NEGATIVE  mg/dL   Protein, ur >=300 (A) NEGATIVE mg/dL   Nitrite NEGATIVE NEGATIVE   Leukocytes,Ua SMALL (A) NEGATIVE   RBC / HPF >50 (H) 0 - 5 RBC/hpf   WBC, UA >50 (H) 0 - 5 WBC/hpf   Bacteria, UA NONE SEEN NONE SEEN    Comment: Performed at [facility], [address], [place], [place] [address]  Urine Culture     Status: None   Collection Time: 10/02/21 10:48 AM   Specimen: Urine, Catheterized  Result Value Ref Range   Specimen Description      URINE, CATHETERIZED Performed at [facility], [address] [place], [place] [address]    Special Requests      NONE Performed at [facility], [address], [place], [place] [address]    Culture      NO GROWTH Performed at [facility], [place] [address], [place], [place] [address]    Report Status 10/04/2021 FINAL   Urinalysis, Routine w reflex microscopic Urine, Catheterized     Status: Abnormal   Collection Time: 10/09/21 12:25 PM  Result Value Ref Range   Color, Urine YELLOW YELLOW   APPearance HAZY (A) CLEAR   Specific Gravity, Urine 1.011 1.005 - 1.030   pH 6.0 5.0 - 8.0   Glucose, UA NEGATIVE  NEGATIVE mg/dL   Hgb urine dipstick LARGE (A) NEGATIVE   Bilirubin Urine NEGATIVE NEGATIVE   Ketones, ur NEGATIVE NEGATIVE mg/dL   Protein, ur 100 (A) NEGATIVE mg/dL   Nitrite NEGATIVE NEGATIVE   Leukocytes,Ua SMALL (A) NEGATIVE   RBC / HPF >50 (H) 0 - 5 RBC/hpf   WBC, UA 11-20 0 - 5 WBC/hpf   Bacteria, UA NONE SEEN NONE SEEN   Squamous Epithelial / LPF 0-5 0 - 5   Mucus PRESENT    Ca Oxalate Crys, UA PRESENT     Comment: Performed at [facility], [address] [place], [place] [address]  Basic metabolic panel     Status: Abnormal   Collection Time: 10/15/21  1:25 PM  Result Value Ref Range   Glucose 117 (H) 70 - 99 mg/dL   BUN 16 8 - 27 mg/dL   Creatinine, Ser 1.55 (H) 0.76 - 1.27 mg/dL   eGFR 46 (L) >59 mL/min/1.73   BUN/Creatinine Ratio 10 10 - 24   Sodium 139 134 - 144 mmol/L   Potassium 4.7 3.5 - 5.2 mmol/L   Chloride 104 96 - 106 mmol/L   CO2 24 20 - 29 mmol/L   Calcium 9.3 8.6 - 10.2 mg/dL  PSA, total and free     Status: Abnormal   Collection Time: 10/15/21  1:25 PM  Result Value Ref Range   Prostate Specific Ag, Serum 25.2 (H) 0.0 - 4.0 ng/mL    Comment: Roche ECLIA methodology. According to the American Urological Association, Serum PSA should decrease and remain at undetectable levels after radical prostatectomy. The AUA defines biochemical recurrence as an initial PSA value 0.2 ng/mL or greater followed by a subsequent confirmatory PSA value 0.2 ng/mL or greater. Values obtained with different assay methods or kits cannot be used interchangeably. Results cannot be interpreted as absolute evidence of the presence or absence of malignant disease.    PSA, Free 5.83 N/A ng/mL    Comment: Roche ECLIA methodology.   PSA, Free Pct 23.1 %    Comment: The table below lists the probability of prostate cancer for men with non-suspicious DRE results and total PSA between 4 and 10  ng/mL, by patient age Ricci Barker, [place], [id number]:[id number]).                   % Free  PSA       50-64 yr        65-75 yr                   0.00-10.00%        56%             55%                  10.01-15.00%        24%             35%                  15.01-20.00%        17%             23%                  20.01-25.00%        10%             20%                       >25.00%         5%              9% Please note:  Catalona et al did not make specific               recommendations regarding the use of               percent free PSA for any other population               of men.   Urinalysis, Routine w reflex microscopic Urine, Catheterized     Status: Abnormal   Collection Time: 10/16/21 11:16 AM  Result Value Ref Range   Color, Urine BROWN (A) YELLOW    Comment: BIOCHEMICALS MAY BE AFFECTED BY COLOR   APPearance TURBID (A) CLEAR   Specific Gravity, Urine 1.015 1.005 - 1.030   pH 7.0 5.0 - 8.0   Glucose, UA NEGATIVE NEGATIVE mg/dL   Hgb urine dipstick LARGE (A) NEGATIVE   Bilirubin Urine NEGATIVE NEGATIVE   Ketones, ur NEGATIVE NEGATIVE mg/dL   Protein, ur 100 (A) NEGATIVE mg/dL   Nitrite POSITIVE (A) NEGATIVE   Leukocytes,Ua SMALL (A) NEGATIVE    Comment: Performed at [facility], [address], [place], [place] [address]  Urinalysis, Microscopic (reflex)     Status: Abnormal   Collection Time: 10/16/21 11:16 AM  Result Value Ref Range   RBC / HPF >50 0 - 5 RBC/hpf   WBC, UA >50 0 - 5 WBC/hpf   Bacteria, UA MANY (A) NONE SEEN   Squamous Epithelial / LPF 0-5 0 - 5    Comment: Performed at [facility], [address], [place], [place] [address]  Urine Culture     Status: Abnormal   Collection Time: 10/16/21 11:16 AM   Specimen: Urine, Catheterized  Result Value Ref Range   Specimen Description      URINE, CATHETERIZED Performed at [facility], [address] [place], [place] [address]    Special Requests      NONE Performed at [facility], [address], [place], [place] [address]    Culture >=100,000 COLONIES/mL KLEBSIELLA PNEUMONIAE (A)  Report  Status 10/19/2021 FINAL    Organism ID, Bacteria KLEBSIELLA PNEUMONIAE (A)       Susceptibility   Klebsiella pneumoniae - MIC*    AMPICILLIN >=32 RESISTANT Resistant     CEFAZOLIN <=4 SENSITIVE Sensitive     CEFEPIME <=0.12 SENSITIVE Sensitive     CEFTRIAXONE <=0.25 SENSITIVE Sensitive     CIPROFLOXACIN <=0.25 SENSITIVE Sensitive     GENTAMICIN <=1 SENSITIVE Sensitive     IMIPENEM <=0.25 SENSITIVE Sensitive     NITROFURANTOIN 128 RESISTANT Resistant     TRIMETH/SULFA <=20 SENSITIVE Sensitive     AMPICILLIN/SULBACTAM 4 SENSITIVE Sensitive     PIP/TAZO <=4 SENSITIVE Sensitive     * >=100,000 COLONIES/mL KLEBSIELLA PNEUMONIAE    Studies/Results: Hospital records and labs reviewed.    Assessment/Plan: BPH with recent retention. He had to have the foley replaced and was treated for a UTI.   I will add finasteride to the alfuzosin, but he will hold the alfuzosin until 3 days prior to f/u.  I will have him return in 3 weeks with the CT results for cystoscopy and a voiding trial.  Side effects reviewed.  Elevated PSA.  His PSA is up to 25 but with the retention that could be artifactual but I will probably need to do a prostate biopsy at some point in the future, but I want to determine if he needs a TURP first.   Gross hematuria.   I will reorder the CT hematuria study stat and have him return in 3 weeks as noted above.  This study will also allow assessment of the prostate volume.     Meds ordered this encounter  Medications   finasteride (PROSCAR) 5 MG tablet    Sig: Take 1 tablet (5 mg total) by mouth daily.    Dispense:  90 tablet    Refill:  3      Orders Placed This Encounter  Procedures   CT HEMATURIA WORKUP    Standing Status:   Future    Standing Expiration Date:   10/29/2022    Order Specific Question:   Reason for Exam (SYMPTOM  OR DIAGNOSIS REQUIRED)    Answer:   gross hematuria.    Order Specific Question:   Preferred imaging location?    Answer:   [facility]    Order Specific Question:   Radiology Contrast Protocol - do NOT remove file path    Answer:   [email or web address]      Return in about 3 weeks (around 11/19/2021) for with CT results for cystoscopy and voiding trial. .    CC: Dr. Tula Nakayama.      Irine Seal 10/29/2021 [phone] ID: [name], male   DOB: 1946-06-28, 75 y.o.   MRN: [id number]

## 2021-10-29 NOTE — Progress Notes (Signed)
Urological Symptom Review  Patient is experiencing the following symptoms: Frequent urination Hard to postpone urination Get up at night to urinate Trouble starting stream   Review of Systems  Gastrointestinal (upper)  : Negative for upper GI symptoms  Gastrointestinal (lower) : Negative for lower GI symptoms  Constitutional : Negative for symptoms  Skin: Negative for skin symptoms  Eyes: Negative for eye symptoms  Ear/Nose/Throat : Negative for Ear/Nose/Throat symptoms  Hematologic/Lymphatic: Negative for Hematologic/Lymphatic symptoms  Cardiovascular : Negative for cardiovascular symptoms  Respiratory : Negative for respiratory symptoms  Endocrine: Negative for endocrine symptoms  Musculoskeletal: Negative for musculoskeletal symptoms  Neurological: Negative for neurological symptoms  Psychologic: Negative for psychiatric symptoms

## 2021-10-30 ENCOUNTER — Ambulatory Visit (HOSPITAL_COMMUNITY)
Admission: RE | Admit: 2021-10-30 | Discharge: 2021-10-30 | Disposition: A | Discharge status: Home / Self Care | Payer: Medicare HMO | Source: Ambulatory Visit | Attending: Urology | Admitting: Urology

## 2021-10-30 DIAGNOSIS — R310 Gross hematuria: Secondary | ICD-10-CM | POA: Insufficient documentation

## 2021-10-30 DIAGNOSIS — N320 Bladder-neck obstruction: Secondary | ICD-10-CM | POA: Diagnosis not present

## 2021-10-30 DIAGNOSIS — N400 Enlarged prostate without lower urinary tract symptoms: Secondary | ICD-10-CM | POA: Diagnosis not present

## 2021-10-30 DIAGNOSIS — N281 Cyst of kidney, acquired: Secondary | ICD-10-CM | POA: Diagnosis not present

## 2021-10-30 MED ORDER — IOHEXOL 300 MG/ML  SOLN
100.0000 mL | Freq: Once | INTRAMUSCULAR | Status: AC | PRN
  Administered 2021-10-30: 100 mL via INTRAVENOUS

## 2021-11-02 ENCOUNTER — Ambulatory Visit (INDEPENDENT_AMBULATORY_CARE_PROVIDER_SITE_OTHER): Payer: Medicare HMO

## 2021-11-02 ENCOUNTER — Telehealth: Payer: Self-pay

## 2021-11-02 ENCOUNTER — Other Ambulatory Visit: Payer: Self-pay

## 2021-11-02 DIAGNOSIS — N401 Enlarged prostate with lower urinary tract symptoms: Secondary | ICD-10-CM | POA: Diagnosis not present

## 2021-11-02 DIAGNOSIS — N138 Other obstructive and reflux uropathy: Secondary | ICD-10-CM

## 2021-11-02 NOTE — Progress Notes (Signed)
Patient came to office today after Dr. Jeffie Pollock reviewed patient CT and catheter balloon was not completely in bladder. 10cc of sterlie water deflated, catheter inserted further, urine return noted. 10cc restilled in balloon with no complications.

## 2021-11-02 NOTE — Telephone Encounter (Signed)
-----   Message from Irine Seal, MD sent at 11/02/2021 11:27 AM EST ----- He has a very large prostate with irregularity which is concerning for possible prostate cancer, but the catheter balloon is inflated in the prostatic urethra.  He needs to be brought in to have the foley advanced into the bladder.      ----- Message ----- From: Dorisann Frames, RN Sent: 10/30/2021   3:31 PM EST To: Irine Seal, MD  Please review

## 2021-11-02 NOTE — Telephone Encounter (Signed)
Patient called and notified of results patient will come to office today to have catheter inserted further into bladder.

## 2021-11-02 NOTE — Telephone Encounter (Signed)
Message left to call office

## 2021-11-03 NOTE — Telephone Encounter (Signed)
Patient called and appt for Thursday scheduled with patient.

## 2021-11-05 ENCOUNTER — Encounter: Payer: Self-pay | Admitting: Urology

## 2021-11-05 ENCOUNTER — Other Ambulatory Visit: Payer: Self-pay

## 2021-11-05 ENCOUNTER — Ambulatory Visit: Payer: Medicare HMO | Admitting: Urology

## 2021-11-05 VITALS — BP 143/79 | HR 106 | Temp 98.6°F | Wt 265.0 lb

## 2021-11-05 DIAGNOSIS — R310 Gross hematuria: Secondary | ICD-10-CM | POA: Diagnosis not present

## 2021-11-05 DIAGNOSIS — N403 Nodular prostate with lower urinary tract symptoms: Secondary | ICD-10-CM | POA: Diagnosis not present

## 2021-11-05 DIAGNOSIS — R339 Retention of urine, unspecified: Secondary | ICD-10-CM | POA: Diagnosis not present

## 2021-11-05 DIAGNOSIS — R972 Elevated prostate specific antigen [PSA]: Secondary | ICD-10-CM

## 2021-11-05 DIAGNOSIS — Z87440 Personal history of urinary (tract) infections: Secondary | ICD-10-CM | POA: Diagnosis not present

## 2021-11-05 DIAGNOSIS — N138 Other obstructive and reflux uropathy: Secondary | ICD-10-CM | POA: Diagnosis not present

## 2021-11-05 DIAGNOSIS — N401 Enlarged prostate with lower urinary tract symptoms: Secondary | ICD-10-CM | POA: Diagnosis not present

## 2021-11-05 LAB — URINALYSIS, ROUTINE W REFLEX MICROSCOPIC
Bilirubin, UA: NEGATIVE
Glucose, UA: NEGATIVE
Ketones, UA: NEGATIVE
Nitrite, UA: POSITIVE — AB
Specific Gravity, UA: 1.02 (ref 1.005–1.030)
Urobilinogen, Ur: 0.2 mg/dL (ref 0.2–1.0)
pH, UA: 6 (ref 5.0–7.5)

## 2021-11-05 LAB — MICROSCOPIC EXAMINATION
RBC, Urine: >30 /hpf — AB (ref 0–2)
Renal Epithel, UA: NONE SEEN /hpf
WBC, UA: >30 /hpf — AB (ref 0–5)

## 2021-11-05 MED ORDER — LEVOFLOXACIN 750 MG PO TABS: ORAL_TABLET | ORAL | 0 refills | Status: DC

## 2021-11-05 NOTE — Progress Notes (Signed)
Urological Symptom Review  Patient is experiencing the following symptoms: none  Review of Systems  Gastrointestinal (upper)  : Negative for upper GI symptoms  Gastrointestinal (lower) : Negative for lower GI symptoms  Constitutional : Weight loss  Skin: Negative for skin symptoms  Eyes: Negative for eye symptoms  Ear/Nose/Throat : Negative for Ear/Nose/Throat symptoms  Hematologic/Lymphatic: Negative for Hematologic/Lymphatic symptoms  Cardiovascular : Negative for cardiovascular symptoms  Respiratory : Negative for respiratory symptoms  Endocrine: Negative for endocrine symptoms  Musculoskeletal: Negative for musculoskeletal symptoms  Neurological: Negative for neurological symptoms  Psychologic: Negative for psychiatric symptoms

## 2021-11-05 NOTE — Addendum Note (Signed)
Addended by: Pollyann Kennedy F on: 11/05/2021 01:45 PM   Modules accepted: Orders

## 2021-11-05 NOTE — Progress Notes (Signed)
Subjective: 1. 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 Elevated PSA   3. Urinary retention   4. Gross hematuria   5. Personal history of urinary infection       02/19/21: [name] is a 75 yo male who is sent by Dr. Moshe Cipro for an elevated PSA that was 12.8 on 11/19/19 and 16.1 on 11/24/20.   I saw him in 2015 for a PSA of 9.28 and recommended a biopsy.  I had seen him in 2013 as well with a PSA of 7.0 and recommended a biopsy.  His PSA in 2012 was 5.99.  He reports that he had a biopsy done at [facility] about 5 years ago.  The biopsy was negative.  He is voiding with moderate LUTS and an IPSS of 18/2.   He has nocturia x 3 and some frequency and urgency.  He has had no hematuria or dysuria.  I have reviewed the records and labs from his ER visit.   10/15/21: [name] returns today in f/u.  He was seen in the ER on 10/6 and was found to be in retention with 1063m PVR.  He has moderate LUTS with nocturia x3 and the retention was sudden in onset.  He has known BPH with a high PSA but negative prior biopsy.  HIs last PSA was 16.1 but he never returned for the MRI that I ordered.  He had >50 RBC's but not gross hematuria.  He returned to the ER the next day for gross hematuria and had to have the catheter irrigated.   A culture was negative but he was started on keflex and the UA had >50 WBC.  The urine is now clear for the past week.  He is on no BPH meds.   10/29/21: Mr. HKiehnreturns today in f/u.  He had to return to the ER after his visit on 10/20 for foley replacement.  The PSA done on 10/15/21 was up to 25 from 16.  He had Klebsiella on a culture from 10/16/21.   He has completed the antibiotic he was given and is doing well with the foley now.   He remains on alfuzosin.  He hasn't had the CT hematuria study that I ordered.   11/05/21: Mr  HHollemanreturns today in f/u.  His foley was repositioned after the CT showed that the foley balloon was in the prostatic urethra and he  came in earlier this week and had it repositioned.  He is have some leakage around the catheter but only if he gets an urge and tries to void.  He has no hematuria since the initial manipulation.  He remains on finasteride and alfuzosin.        ROS:  ROS  Allergies  Allergen Reactions   Chlorthalidone Rash    Past Medical History:  Diagnosis Date   Arthritis    Atrial fibrillation ([place]    Present by ECG 01/2012   Colon polyps    Diverticulosis of colon    Erectile dysfunction    Essential hypertension    Gout    Hyperlipidemia    Metabolic syndrome X 52/07/33  Obesity     Past Surgical History:  Procedure Laterality Date   Colon polyps removed  2002   Dr. STamala Julian  COLONOSCOPY  2004   Dr. RMisty Stanleyprep, ACBE showed diverticulosis   COLONOSCOPY  11/24/2011   Procedure: COLONOSCOPY;  Surgeon: RDaneil Dolin MD;  [facility];  Service: Endoscopy;  Laterality: N/A;  9:10   COLONOSCOPY N/A 03/26/2020   Procedure: COLONOSCOPY;  Surgeon: Daneil Dolin, MD;  [facility];  Service: Endoscopy;  Laterality: N/A;  9:30   HERNIA REPAIR     PROSTATE BIOPSY      Social History   Socioeconomic History   Marital status: Married    Spouse name: Not on file   Number of children: 4   Years of education: 12   Highest education level: 12th grade  Occupational History   Occupation: Retired  Tobacco Use   Smoking status: Former    Packs/day: 2.00    Years: 5.00    Pack years: 10.00    Types: Cigarettes   Smokeless tobacco: Never  Vaping Use   Vaping Use: Never used  Substance and Sexual Activity   Alcohol use: No   Drug use: No   Sexual activity: Not on file  Other Topics Concern   Not on file  Social History Narrative   Lives with wife alone    Social Determinants of Health   Financial Resource Strain: Not on file  Food Insecurity: No Food Insecurity   Worried About Charity fundraiser in the Last Year: Never true   [place] in  the Last Year: Never true  Transportation Needs: No Transportation Needs   Lack of Transportation (Medical): No   Lack of Transportation (Non-Medical): No  Physical Activity: Sufficiently Active   Days of Exercise per Week: 5 days   Minutes of Exercise per Session: 30 min  Stress: No Stress Concern Present   Feeling of Stress : Not at all  Social Connections: Moderately Integrated   Frequency of Communication with Friends and Family: More than three times a week   Frequency of Social Gatherings with Friends and Family: More than three times a week   Attends Religious Services: More than 4 times per year   Active Member of [employer] or Organizations: No   Attends Archivist Meetings: Never   Marital Status: Married  Human resources officer Violence: Not on file    Family History  Problem Relation Age of Onset   Hypertension Mother    Cancer Mother        Rectal, approximately 68   Diabetes Father    Alzheimer's disease Father    Liver disease Neg Hx    Colon cancer Neg Hx     Anti-infectives: Anti-infectives (From admission, onward)    Start     Dose/Rate Route Frequency Ordered Stop   11/05/21 0000  levofloxacin (LEVAQUIN) 750 MG tablet           11/05/21 1034         Current Outpatient Medications  Medication Sig Dispense Refill   allopurinol (ZYLOPRIM) 300 MG tablet Take 1 tablet (300 mg total) by mouth daily. (Patient taking differently: Take 300 mg by mouth 3 (three) times a week.) 90 tablet 1   amLODipine (NORVASC) 10 MG tablet Take 1 tablet (10 mg total) by mouth daily. 90 tablet 3   benazepril (LOTENSIN) 40 MG tablet Take 1 tablet (40 mg total) by mouth daily. 90 tablet 3   finasteride (PROSCAR) 5 MG tablet Take 1 tablet (5 mg total) by mouth daily. 90 tablet 3   levofloxacin (LEVAQUIN) 750 MG tablet Take one hour prior to prostate biopsy 1 tablet 0   naproxen sodium (ALEVE) 220 MG tablet Take 440 mg by mouth 2 (two) times daily as needed (  knee pain.).      alfuzosin (UROXATRAL) 10 MG 24 hr tablet Take 1 tablet (10 mg total) by mouth daily with breakfast. (Patient not taking: Reported on 11/05/2021) 30 tablet 11   No current facility-administered medications for this visit.     Objective: Vital signs in last 24 hours: BP (!) 143/79   Pulse (!) 106   Temp 98.6 F (37 C)   Wt 265 lb (120.2 kg)   BMI 34.96 kg/m   Intake/Output from previous day: No intake/output data recorded. Intake/Output this shift: _0 @   Physical Exam  Lab Results:  Results for orders placed or performed in visit on 11/05/21 (from the past 24 hour(s))  Urinalysis, Routine w reflex microscopic     Status: Abnormal   Collection Time: 11/05/21 10:05 AM  Result Value Ref Range   Specific Gravity, UA 1.020 1.005 - 1.030   pH, UA 6.0 5.0 - 7.5   Color, UA Amber (A) Yellow   Appearance Ur Hazy (A) Clear   Leukocytes,UA 3+ (A) Negative   Protein,UA 3+ (A) Negative/Trace   Glucose, UA Negative Negative   Ketones, UA Negative Negative   RBC, UA 3+ (A) Negative   Bilirubin, UA Negative Negative   Urobilinogen, Ur 0.2 0.2 - 1.0 mg/dL   Nitrite, UA Positive (A) Negative   Microscopic Examination See below:    Narrative   Performed at:  [facility] [address] [place], [place]  [id number] Lab Director: Mina Marble MT, Phone:  [phone]  Microscopic Examination     Status: Abnormal   Collection Time: 11/05/21 10:05 AM   Urine  Result Value Ref Range   WBC, UA >30 (A) 0 - 5 /hpf   RBC >30 (A) 0 - 2 /hpf   Epithelial Cells (non renal) 0-10 0 - 10 /hpf   Renal Epithel, UA None seen None seen /hpf   Mucus, UA Present Not Estab.   Bacteria, UA Many (A) None seen/Few   Narrative   Performed at:  [facility] [address] [place], [place]  [id number] Lab Director: Mina Marble MT, Phone:  [phone]     BMET Recent Results (from the past 2160 hour(s))  Urinalysis, Routine w reflex microscopic Urine,  Catheterized     Status: Abnormal   Collection Time: 10/01/21 10:20 AM  Result Value Ref Range   Color, Urine YELLOW YELLOW   APPearance CLEAR CLEAR   Specific Gravity, Urine 1.009 1.005 - 1.030   pH 6.0 5.0 - 8.0   Glucose, UA NEGATIVE NEGATIVE mg/dL   Hgb urine dipstick MODERATE (A) NEGATIVE   Bilirubin Urine NEGATIVE NEGATIVE   Ketones, ur NEGATIVE NEGATIVE mg/dL   Protein, ur >=300 (A) NEGATIVE mg/dL   Nitrite NEGATIVE NEGATIVE   Leukocytes,Ua NEGATIVE NEGATIVE   RBC / HPF >50 (H) 0 - 5 RBC/hpf   WBC, UA 0-5 0 - 5 WBC/hpf   Bacteria, UA NONE SEEN NONE SEEN    Comment: Performed at [facility], [address] [place], [place] [address]  Basic metabolic panel     Status: Abnormal   Collection Time: 10/02/21 10:28 AM  Result Value Ref Range   Sodium 138 135 - 145 mmol/L   Potassium 4.7 3.5 - 5.1 mmol/L   Chloride 104 98 - 111 mmol/L   CO2 27 22 - 32 mmol/L   Glucose, Bld 131 (H) 70 - 99 mg/dL    Comment: Glucose reference range applies only to samples taken after  fasting for at least 8 hours.   BUN 20 8 - 23 mg/dL   Creatinine, Ser 1.53 (H) 0.61 - 1.24 mg/dL   Calcium 9.0 8.9 - 10.3 mg/dL   GFR, Estimated 47 (L) >60 mL/min    Comment: (NOTE) Calculated using the CKD-EPI Creatinine Equation (2021)    Anion gap 7 5 - 15    Comment: Performed at [facility], [address] [place], [place] [address]  CBC with Differential     Status: Abnormal   Collection Time: 10/02/21 10:28 AM  Result Value Ref Range   WBC 9.4 4.0 - 10.5 K/uL   RBC 5.09 4.22 - 5.81 MIL/uL   Hemoglobin 13.2 13.0 - 17.0 g/dL   HCT 40.8 39.0 - 52.0 %   MCV 80.2 80.0 - 100.0 fL   MCH 25.9 (L) 26.0 - 34.0 pg   MCHC 32.4 30.0 - 36.0 g/dL   RDW 14.9 11.5 - 15.5 %   Platelets 243 150 - 400 K/uL   nRBC 0.0 0.0 - 0.2 %   Neutrophils Relative % 80 %   Neutro Abs 7.6 1.7 - 7.7 K/uL   Lymphocytes Relative 12 %   Lymphs Abs 1.1 0.7 - 4.0 K/uL   Monocytes Relative 6 %   Monocytes Absolute 0.6 0.1 - 1.0 K/uL    Eosinophils Relative 1 %   Eosinophils Absolute 0.1 0.0 - 0.5 K/uL   Basophils Relative 0 %   Basophils Absolute 0.0 0.0 - 0.1 K/uL   Immature Granulocytes 1 %   Abs Immature Granulocytes 0.08 (H) 0.00 - 0.07 K/uL    Comment: Performed at [facility], [address], [place], [place] [address]  Urinalysis, Routine w reflex microscopic Urine, Catheterized     Status: Abnormal   Collection Time: 10/02/21 10:48 AM  Result Value Ref Range   Color, Urine AMBER (A) YELLOW   APPearance HAZY (A) CLEAR   Specific Gravity, Urine 1.012 1.005 - 1.030   pH 6.0 5.0 - 8.0   Glucose, UA NEGATIVE NEGATIVE mg/dL   Hgb urine dipstick LARGE (A) NEGATIVE   Bilirubin Urine NEGATIVE NEGATIVE   Ketones, ur NEGATIVE NEGATIVE mg/dL   Protein, ur >=300 (A) NEGATIVE mg/dL   Nitrite NEGATIVE NEGATIVE   Leukocytes,Ua SMALL (A) NEGATIVE   RBC / HPF >50 (H) 0 - 5 RBC/hpf   WBC, UA >50 (H) 0 - 5 WBC/hpf   Bacteria, UA NONE SEEN NONE SEEN    Comment: Performed at [facility], [address] [place], [place] [address]  Urine Culture     Status: None   Collection Time: 10/02/21 10:48 AM   Specimen: Urine, Catheterized  Result Value Ref Range   Specimen Description      URINE, CATHETERIZED Performed at [facility], [address], [place], [place] [address]    Special Requests      NONE Performed at [facility], [address], [place], [place] [address]    Culture      NO GROWTH Performed at [facility], [address] [place],  [address]    Report Status 10/04/2021 FINAL   Urinalysis, Routine w reflex microscopic Urine, Catheterized     Status: Abnormal   Collection Time: 10/09/21 12:25 PM  Result Value Ref Range   Color, Urine YELLOW YELLOW   APPearance HAZY (A) CLEAR   Specific Gravity, Urine 1.011 1.005 - 1.030   pH 6.0 5.0 - 8.0   Glucose, UA NEGATIVE NEGATIVE mg/dL   Hgb  urine dipstick LARGE (A) NEGATIVE   Bilirubin Urine NEGATIVE NEGATIVE   Ketones, ur NEGATIVE NEGATIVE  mg/dL   Protein, ur 100 (A) NEGATIVE mg/dL   Nitrite NEGATIVE NEGATIVE   Leukocytes,Ua SMALL (A) NEGATIVE   RBC / HPF >50 (H) 0 - 5 RBC/hpf   WBC, UA 11-20 0 - 5 WBC/hpf   Bacteria, UA NONE SEEN NONE SEEN   Squamous Epithelial / LPF 0-5 0 - 5   Mucus PRESENT    Ca Oxalate Crys, UA PRESENT     Comment: Performed at [facility], [address] [place], [place] [address]  Basic metabolic panel     Status: Abnormal   Collection Time: 10/15/21  1:25 PM  Result Value Ref Range   Glucose 117 (H) 70 - 99 mg/dL   BUN 16 8 - 27 mg/dL   Creatinine, Ser 1.55 (H) 0.76 - 1.27 mg/dL   eGFR 46 (L) >59 mL/min/1.73   BUN/Creatinine Ratio 10 10 - 24   Sodium 139 134 - 144 mmol/L   Potassium 4.7 3.5 - 5.2 mmol/L   Chloride 104 96 - 106 mmol/L   CO2 24 20 - 29 mmol/L   Calcium 9.3 8.6 - 10.2 mg/dL  PSA, total and free     Status: Abnormal   Collection Time: 10/15/21  1:25 PM  Result Value Ref Range   Prostate Specific Ag, Serum 25.2 (H) 0.0 - 4.0 ng/mL    Comment: Roche ECLIA methodology. According to the American Urological Association, Serum PSA should decrease and remain at undetectable levels after radical prostatectomy. The AUA defines biochemical recurrence as an initial PSA value 0.2 ng/mL or greater followed by a subsequent confirmatory PSA value 0.2 ng/mL or greater. Values obtained with different assay methods or kits cannot be used interchangeably. Results cannot be interpreted as absolute evidence of the presence or absence of malignant disease.    PSA, Free 5.83 N/A ng/mL    Comment: Roche ECLIA methodology.   PSA, Free Pct 23.1 %    Comment: The table below lists the probability of prostate cancer for men with non-suspicious DRE results and total PSA between 4 and 10 ng/mL, by patient age Ricci Barker, [place], [id number]).                   % Free PSA       50-64 yr        65-75 yr                   0.00-10.00%        56%             55%                  10.01-15.00%         24%             35%                  15.01-20.00%        17%             23%                  20.01-25.00%        10%             20%                       >25.00%  5%              9% Please note:  Catalona et al did not make specific               recommendations regarding the use of               percent free PSA for any other population               of men.   Urinalysis, Routine w reflex microscopic Urine, Catheterized     Status: Abnormal   Collection Time: 10/16/21 11:16 AM  Result Value Ref Range   Color, Urine BROWN (A) YELLOW    Comment: BIOCHEMICALS MAY BE AFFECTED BY COLOR   APPearance TURBID (A) CLEAR   Specific Gravity, Urine 1.015 1.005 - 1.030   pH 7.0 5.0 - 8.0   Glucose, UA NEGATIVE NEGATIVE mg/dL   Hgb urine dipstick LARGE (A) NEGATIVE   Bilirubin Urine NEGATIVE NEGATIVE   Ketones, ur NEGATIVE NEGATIVE mg/dL   Protein, ur 100 (A) NEGATIVE mg/dL   Nitrite POSITIVE (A) NEGATIVE   Leukocytes,Ua SMALL (A) NEGATIVE    Comment: Performed at [facility], [address], [place], [place] [address]  Urinalysis, Microscopic (reflex)     Status: Abnormal   Collection Time: 10/16/21 11:16 AM  Result Value Ref Range   RBC / HPF >50 0 - 5 RBC/hpf   WBC, UA >50 0 - 5 WBC/hpf   Bacteria, UA MANY (A) NONE SEEN   Squamous Epithelial / LPF 0-5 0 - 5    Comment: Performed at [facility], [address], [place], [place] [address]  Urine Culture     Status: Abnormal   Collection Time: 10/16/21 11:16 AM   Specimen: Urine, Catheterized  Result Value Ref Range   Specimen Description      URINE, CATHETERIZED Performed at [facility], [address] [place], [place] [address]    Special Requests      NONE Performed at [facility], [address] [place], [place] [address]    Culture >=100,000 COLONIES/mL KLEBSIELLA PNEUMONIAE (A)    Report Status 10/19/2021 FINAL    Organism ID, Bacteria KLEBSIELLA PNEUMONIAE (A)       Susceptibility   Klebsiella pneumoniae - MIC*     AMPICILLIN >=32 RESISTANT Resistant     CEFAZOLIN <=4 SENSITIVE Sensitive     CEFEPIME <=0.12 SENSITIVE Sensitive     CEFTRIAXONE <=0.25 SENSITIVE Sensitive     CIPROFLOXACIN <=0.25 SENSITIVE Sensitive     GENTAMICIN <=1 SENSITIVE Sensitive     IMIPENEM <=0.25 SENSITIVE Sensitive     NITROFURANTOIN 128 RESISTANT Resistant     TRIMETH/SULFA <=20 SENSITIVE Sensitive     AMPICILLIN/SULBACTAM 4 SENSITIVE Sensitive     PIP/TAZO <=4 SENSITIVE Sensitive     * >=100,000 COLONIES/mL KLEBSIELLA PNEUMONIAE  Urinalysis, Routine w reflex microscopic     Status: Abnormal   Collection Time: 11/05/21 10:05 AM  Result Value Ref Range   Specific Gravity, UA 1.020 1.005 - 1.030   pH, UA 6.0 5.0 - 7.5   Color, UA Amber (A) Yellow   Appearance Ur Hazy (A) Clear   Leukocytes,UA 3+ (A) Negative   Protein,UA 3+ (A) Negative/Trace   Glucose, UA Negative Negative   Ketones, UA Negative Negative   RBC, UA 3+ (A) Negative   Bilirubin, UA Negative Negative   Urobilinogen, Ur 0.2 0.2 - 1.0 mg/dL   Nitrite, UA Positive (A) Negative   Microscopic  Examination See below:   Microscopic Examination     Status: Abnormal   Collection Time: 11/05/21 10:05 AM   Urine  Result Value Ref Range   WBC, UA >30 (A) 0 - 5 /hpf   RBC >30 (A) 0 - 2 /hpf   Epithelial Cells (non renal) 0-10 0 - 10 /hpf   Renal Epithel, UA None seen None seen /hpf   Mucus, UA Present Not Estab.   Bacteria, UA Many (A) None seen/Few    Studies/Results: CT HEMATURIA WORKUP  Result Date: 10/30/2021 CLINICAL DATA:  Gross hematuria. EXAM: CT ABDOMEN AND PELVIS WITHOUT AND WITH CONTRAST TECHNIQUE: Multidetector CT imaging of the abdomen and pelvis was performed following the standard protocol before and following the bolus administration of intravenous contrast. CONTRAST:  173m OMNIPAQUE IOHEXOL 300 MG/ML  SOLN COMPARISON:  None. FINDINGS: Lower Chest: No acute findings. Hepatobiliary: No hepatic masses identified. Gallbladder is unremarkable.  No evidence of biliary ductal dilatation. Pancreas:  No mass or inflammatory changes. Spleen: Within normal limits in size and appearance. Adrenals/Urinary Tract: No adrenal masses identified. No evidence of urolithiasis or hydronephrosis. Multiple benign-appearing renal cysts are seen bilaterally. No complex cystic or solid renal masses identified. No masses seen involving the collecting systems, ureters, or bladder. Diffuse bladder wall thickening is seen, consistent with chronic bladder outlet obstruction. Markedly enlarged prostate gland shows mass effect on bladder base. Stomach/Bowel: No evidence of obstruction, inflammatory process or abnormal fluid collections. Normal appendix visualized. Vascular/Lymphatic: No pathologically enlarged lymph nodes. No acute vascular findings. Aortic atherosclerotic calcification noted. Reproductive: Markedly enlarged prostate gland is seen with mass effect on the bladder base. A Foley catheter is seen in place with the balloon located in the prostatic portion of the urethra. Other:  None. Musculoskeletal:  No suspicious bone lesions identified. IMPRESSION: No radiographic evidence of urinary tract neoplasm, urolithiasis, or hydronephrosis. Markedly enlarged prostate, with findings of chronic bladder outlet obstruction. Foley catheter balloon located in the prostatic portion of the urethra. Consider repositioning. Aortic Atherosclerosis (ICD10-I70.0). Electronically Signed   By: JMarlaine HindM.D.   On: 10/30/2021 13:55       Assessment/Plan: Nodular prostate with retention and elevated PSA.  I will get him set up for a prostate [place] biospy and have reviewed the risks of bleeding, infection and voiding difficulty.   He will return in 2 weeks for a cath exchange and culture and have the biopsy in about 3-4 weeks.  He will stay on alfuzosin and finasteride.    Gross hematuria.   This has resolved.  He has no upper tract issues.  History of UTI.  I will get a culture  with the cath exchange.     Meds ordered this encounter  Medications   levofloxacin (LEVAQUIN) 750 MG tablet    Sig: Take one hour prior to prostate biopsy    Dispense:  1 tablet    Refill:  0       Orders Placed This Encounter  Procedures   Urine Culture    Standing Status:   Future    Standing Expiration Date:   03/05/2022   Microscopic Examination   [place]OSTATE BIOPSY MULTIPLE    Standing Status:   Future    Standing Expiration Date:   03/05/2022    Order Specific Question:   Reason for Exam (SYMPTOM  OR DIAGNOSIS REQUIRED)    Answer:   nodular prostate    Order Specific Question:   Preferred location?    Answer:  [facility]   US Guided Needle Placement    Standing Status:   Future    Standing Expiration Date:   11/05/2022    Order Specific Question:   Reason for Exam (SYMPTOM  OR DIAGNOSIS REQUIRED)    Answer:   elevated psa    Order Specific Question:   Preferred imaging location?    Answer:   [facility]   [place] Transrectal Complete    Standing Status:   Future    Standing Expiration Date:   11/05/2022    Order Specific Question:   Reason for Exam (SYMPTOM  OR DIAGNOSIS REQUIRED)    Answer:   elevated psa    Order Specific Question:   Preferred imaging location?    Answer:   [facility]   Urinalysis, Routine w reflex microscopic      Return for Needs a urine culture and cath change on about 11/20 and then will need f/u after bx. .    CC: Dr. Tula Nakayama.      Irine Seal 11/05/[phone] ID: [name], male   DOB: 1946-07-28, 75 y.o.   MRN: [id number] Patient ID: Carlos Leon, male   DOB: Feb 11, 1946, 75 y.o.   MRN: [id number]

## 2021-11-05 NOTE — Patient Instructions (Addendum)
   Appointment Time: arrive 8am Appointment Date: 11/12/2021  Location: [facility] Radiology Department   Prostate Biopsy Instructions  Stop all aspirin or blood thinners (aspirin, plavix, coumadin, warfarin, motrin, ibuprofen, advil, aleve, naproxen, naprosyn) for 7 days prior to the procedure.  If you have any questions about stopping these medications, please contact your primary care physician or cardiologist.  Having a light meal prior to the procedure is recommended.  If you are diabetic or have low blood sugar please bring a small snack or glucose tablet.  A Fleets enema is needed to be purchased over the counter at a local pharmacy and used 2 hours before you scheduled appointment.  This can be purchased over the counter at any pharmacy.  Antibiotics will be administered in the clinic at the time of the procedure and 1 tablet has been sent to your pharmacy. Please take the antibiotic as prescribed.    Please bring someone with you to the procedure to drive you home if you are given a valium to take prior to your procedure.   If you have any questions or concerns, please feel free to call the office at [phone] or send a Mychart message.    Thank you, [facility]

## 2021-11-07 LAB — URINE CULTURE

## 2021-11-10 ENCOUNTER — Telehealth: Payer: Self-pay

## 2021-11-10 NOTE — Telephone Encounter (Signed)
-----   Message from Cleon Gustin, MD sent at 11/10/2021  9:19 AM EST ----- negative ----- Message ----- From: Dorisann Frames, RN Sent: 11/09/2021  10:58 AM EST To: Cleon Gustin, MD  Please review

## 2021-11-10 NOTE — Telephone Encounter (Signed)
Patient returned call and notified of negative culture results.

## 2021-11-10 NOTE — Telephone Encounter (Addendum)
Patient called. No answer. Left message to call.

## 2021-11-11 ENCOUNTER — Telehealth: Payer: Self-pay

## 2021-11-11 NOTE — Telephone Encounter (Signed)
Left a Voice Message:  Needing to speak with a nurse.  Call back:  [phone]  Thanks, Helene Kelp

## 2021-11-11 NOTE — Telephone Encounter (Signed)
Patient foley leg bag has a hole. New leg bag given to patient.

## 2021-11-12 ENCOUNTER — Encounter: Payer: Self-pay | Admitting: Urology

## 2021-11-12 ENCOUNTER — Encounter (HOSPITAL_COMMUNITY): Payer: Self-pay

## 2021-11-12 ENCOUNTER — Other Ambulatory Visit: Payer: Self-pay

## 2021-11-12 ENCOUNTER — Other Ambulatory Visit: Payer: Self-pay | Admitting: Urology

## 2021-11-12 ENCOUNTER — Ambulatory Visit: Payer: Medicare HMO

## 2021-11-12 ENCOUNTER — Ambulatory Visit (INDEPENDENT_AMBULATORY_CARE_PROVIDER_SITE_OTHER): Payer: Medicare HMO | Admitting: Urology

## 2021-11-12 ENCOUNTER — Ambulatory Visit (HOSPITAL_COMMUNITY)
Admission: RE | Admit: 2021-11-12 | Discharge: 2021-11-12 | Disposition: A | Discharge status: Home / Self Care | Payer: Medicare HMO | Source: Ambulatory Visit | Attending: Urology | Admitting: Urology

## 2021-11-12 DIAGNOSIS — N403 Nodular prostate with lower urinary tract symptoms: Secondary | ICD-10-CM

## 2021-11-12 DIAGNOSIS — C61 Malignant neoplasm of prostate: Secondary | ICD-10-CM | POA: Insufficient documentation

## 2021-11-12 DIAGNOSIS — R972 Elevated prostate specific antigen [PSA]: Secondary | ICD-10-CM | POA: Diagnosis not present

## 2021-11-12 DIAGNOSIS — N411 Chronic prostatitis: Secondary | ICD-10-CM | POA: Diagnosis not present

## 2021-11-12 DIAGNOSIS — N414 Granulomatous prostatitis: Secondary | ICD-10-CM | POA: Diagnosis not present

## 2021-11-12 MED ORDER — CEFTRIAXONE SODIUM 1 G IJ SOLR: 1.0000 g | Freq: Once | INTRAMUSCULAR | Status: AC

## 2021-11-12 MED ORDER — LIDOCAINE HCL (PF) 1 % IJ SOLN
INTRAMUSCULAR | Status: AC
  Administered 2021-11-12: 08:00:00 2.1 mL via INTRADERMAL
  Filled 2021-11-12: qty 5

## 2021-11-12 MED ORDER — LIDOCAINE HCL (PF) 2 % IJ SOLN
INTRAMUSCULAR | Status: AC
  Administered 2021-11-12: 08:00:00 10 mL
  Filled 2021-11-12: qty 10

## 2021-11-12 MED ORDER — CEFTRIAXONE SODIUM 1 G IJ SOLR
INTRAMUSCULAR | Status: AC
  Administered 2021-11-12: 08:00:00 1 g via INTRAMUSCULAR
  Filled 2021-11-12: qty 10

## 2021-11-12 MED ORDER — LIDOCAINE HCL (PF) 2 % IJ SOLN: 10.0000 mL | Freq: Once | INTRAMUSCULAR | Status: AC

## 2021-11-12 MED ORDER — LIDOCAINE HCL (PF) 1 % IJ SOLN: 2.1000 mL | Freq: Once | INTRAMUSCULAR | Status: AC

## 2021-11-12 MED ORDER — LIDOCAINE HCL (PF) 2 % IJ SOLN
INTRAMUSCULAR | Status: AC
  Filled 2021-11-12: qty 10

## 2021-11-12 NOTE — Progress Notes (Signed)
PT tolerated prostate biopsy procedure and antibiotic injection well today. Labs obtained and sent for pathology. PT ambulatory at discharge with no acute distress noted and verbalized understanding of discharge instructions.  

## 2021-11-12 NOTE — Progress Notes (Signed)
Procedure: 1.  Transrectal ultrasound of the prostate. 2.  Ultrasound-guided prostate biopsy.  Preop diagnosis: Elevated PSA and possible prostate nodule.  Postop diagnosis: Same.  Surgeon: Dr. Irine Seal.  Anesthesia: Local.  Drains: None.  Specimen: 12 core prostate needle biopsy.  EBL: None.  Complications: None.  Indications: The patient is a 75 year old male with recent urinary retention and a chronically elevated with rising PSA who had a CT scan done recently that showed concern for nodularity on the posterior prostate.  It was felt that biopsy was indicated prior to definitive therapy for his enlarged prostate with retention.  Procedure: He was taken the ultrasound suite.  He had taken Levaquin preoperatively and administered the preoperative enema.  He was given Rocephin IM.  He was placed in the left lateral decubitus position and the 10 MHz ultrasound probe was inserted.  Scanning demonstrated a very large prostate with a volume of 295 mL.  Seminal vesicles were normal.  He had a very large transitional zone with some cystic changes but no stones.  The peripheral zone was compressed but at the right base there was hypoechogenicity that extended posterior to the seminal vesicles.  The remainder of the peripheral zone was unremarkable.  A Foley catheter is in place and the balloon appeared to be inflated in the middle third of the prostatic urethra with the tip of the catheter at the bladder neck level with a decompressed bladder.  His catheter had been in this location  on previous imaging and had an attempt at repositioning at his last office visit.  Once the diagnostic scan was performed, 12 needle core biopsies were obtained in the standard configuration with care taken to target the hypoechoic area at the right base.  Care was taken to avoid puncture of the Foley balloon as well.  After completion of biopsies the probe was removed.  There was minimal bleeding noted.  There  were no complications.

## 2021-11-16 ENCOUNTER — Ambulatory Visit: Payer: Medicare HMO

## 2021-11-16 ENCOUNTER — Other Ambulatory Visit: Payer: Self-pay

## 2021-11-16 DIAGNOSIS — Z87440 Personal history of urinary (tract) infections: Secondary | ICD-10-CM | POA: Diagnosis not present

## 2021-11-16 NOTE — Patient Instructions (Signed)

## 2021-11-16 NOTE — Progress Notes (Unsigned)
Cath Change/ Replacement  Patient is present today for a catheter change due to urinary retention.  75ml of water was removed from the balloon, a 16FR coude foley cath was removed with out difficulty.  Patient was cleaned and prepped in a sterile fashion with betadine. A 16 FR coude foley cath was replaced into the bladder no complications were noted Urine return was noted 56ml and urine was yellow in color. The balloon was filled with 72ml of sterile water. A leg bag was attached for drainage.  Patient was given proper instruction on catheter care.    Performed by: Hope LPN   Follow up: Keep next scheduled ov.

## 2021-11-17 ENCOUNTER — Telehealth: Payer: Medicare HMO

## 2021-11-18 ENCOUNTER — Ambulatory Visit: Payer: Medicare HMO

## 2021-11-18 LAB — URINE CULTURE

## 2021-11-26 ENCOUNTER — Other Ambulatory Visit: Payer: Self-pay

## 2021-11-26 ENCOUNTER — Other Ambulatory Visit: Payer: Medicare HMO | Admitting: Urology

## 2021-11-26 ENCOUNTER — Ambulatory Visit: Payer: Medicare HMO | Admitting: Urology

## 2021-11-26 DIAGNOSIS — N403 Nodular prostate with lower urinary tract symptoms: Secondary | ICD-10-CM

## 2021-11-26 DIAGNOSIS — R339 Retention of urine, unspecified: Secondary | ICD-10-CM

## 2021-11-26 DIAGNOSIS — C61 Malignant neoplasm of prostate: Secondary | ICD-10-CM

## 2021-11-26 DIAGNOSIS — R972 Elevated prostate specific antigen [PSA]: Secondary | ICD-10-CM | POA: Diagnosis not present

## 2021-11-26 MED ORDER — ALFUZOSIN HCL ER 10 MG PO TB24: 10.0000 mg | ORAL_TABLET | Freq: Every day | ORAL | 11 refills | Status: DC

## 2021-11-26 NOTE — Progress Notes (Signed)
Subjective: 1. Elevated PSA   2. Prostate cancer ([place])   3. 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 Urinary retention        02/19/21: Mr. Weissinger is a 75 yo male who is sent by Dr. Moshe Cipro for an elevated PSA that was 12.8 on 11/19/19 and 16.1 on 11/24/20.   I saw him in 2015 for a PSA of 9.28 and recommended a biopsy.  I had seen him in 2013 as well with a PSA of 7.0 and recommended a biopsy.  His PSA in 2012 was 5.99.  He reports that he had a biopsy done at [facility] about 5 years ago.  The biopsy was negative.  He is voiding with moderate LUTS and an IPSS of 18/2.   He has nocturia x 3 and some frequency and urgency.  He has had no hematuria or dysuria.  I have reviewed the records and labs from his ER visit.   10/15/21: Mr. Tober returns today in f/u.  He was seen in the ER on 10/6 and was found to be in retention with 1026m PVR.  He has moderate LUTS with nocturia x3 and the retention was sudden in onset.  He has known BPH with a high PSA but negative prior biopsy.  HIs last PSA was 16.1 but he never returned for the MRI that I ordered.  He had >50 RBC's but not gross hematuria.  He returned to the ER the next day for gross hematuria and had to have the catheter irrigated.   A culture was negative but he was started on keflex and the UA had >50 WBC.  The urine is now clear for the past week.  He is on no BPH meds.   10/29/21: Mr. HEssonreturns today in f/u.  He had to return to the ER after his visit on 10/20 for foley replacement.  The PSA done on 10/15/21 was up to 25 from 16.  He had Klebsiella on a culture from 10/16/21.   He has completed the antibiotic he was given and is doing well with the foley now.   He remains on alfuzosin.  He hasn't had the CT hematuria study that I ordered.   11/05/21: Mr  HWhetsellreturns today in f/u.  His foley was repositioned after the CT showed that the foley balloon was in the prostatic urethra and he came in earlier this week and had it  repositioned.  He is have some leakage around the catheter but only if he gets an urge and tries to void.  He has no hematuria since the initial manipulation.  He remains on finasteride and alfuzosin.     11/26/21:   Mr. HSelfreturns today in f/u to discuss his prostate biopsy.  His Prostate volume was 2935mand he had a single core at the left base with 5% GG1 disease.   He remains on finasteride and has a foley in place for retention.    ROS:  ROS  Allergies  Allergen Reactions   Chlorthalidone Rash    Past Medical History:  Diagnosis Date   Arthritis    Atrial fibrillation ([place]   Present by ECG 01/2012   Colon polyps    Diverticulosis of colon    Erectile dysfunction    Essential hypertension    Gout    Hyperlipidemia    Metabolic syndrome X 5/04/30/9740 Obesity     Past Surgical History:  Procedure Laterality Date   Colon  polyps removed  2002   Dr. Tamala Julian   COLONOSCOPY  2004   Dr. Rourk-->poor prep, ACBE showed diverticulosis   COLONOSCOPY  11/24/2011   Procedure: COLONOSCOPY;  Surgeon: Daneil Dolin, MD;  [facility];  Service: Endoscopy;  Laterality: N/A;  9:10   COLONOSCOPY N/A 03/26/2020   Procedure: COLONOSCOPY;  Surgeon: Daneil Dolin, MD;  [facility];  Service: Endoscopy;  Laterality: N/A;  9:30   HERNIA REPAIR     PROSTATE BIOPSY      Social History   Socioeconomic History   Marital status: Married    Spouse name: Not on file   Number of children: 4   Years of education: 12   Highest education level: 12th grade  Occupational History   Occupation: Retired  Tobacco Use   Smoking status: Former    Packs/day: 2.00    Years: 5.00    Pack years: 10.00    Types: Cigarettes   Smokeless tobacco: Never  Vaping Use   Vaping Use: Never used  Substance and Sexual Activity   Alcohol use: No   Drug use: No   Sexual activity: Not on file  Other Topics Concern   Not on file  Social History Narrative   Lives with wife alone     Social Determinants of Health   Financial Resource Strain: Not on file  Food Insecurity: No Food Insecurity   Worried About Charity fundraiser in the Last Year: Never true   [place] in the Last Year: Never true  Transportation Needs: No Transportation Needs   Lack of Transportation (Medical): No   Lack of Transportation (Non-Medical): No  Physical Activity: Sufficiently Active   Days of Exercise per Week: 5 days   Minutes of Exercise per Session: 30 min  Stress: No Stress Concern Present   Feeling of Stress : Not at all  Social Connections: Moderately Integrated   Frequency of Communication with Friends and Family: More than three times a week   Frequency of Social Gatherings with Friends and Family: More than three times a week   Attends Religious Services: More than 4 times per year   Active Member of [employer] or Organizations: No   Attends Archivist Meetings: Never   Marital Status: Married  Human resources officer Violence: Not on file    Family History  Problem Relation Age of Onset   Hypertension Mother    Cancer Mother        Rectal, approximately 58   Diabetes Father    Alzheimer's disease Father    Liver disease Neg Hx    Colon cancer Neg Hx     Anti-infectives: Anti-infectives (From admission, onward)    None       Current Outpatient Medications  Medication Sig Dispense Refill   alfuzosin (UROXATRAL) 10 MG 24 hr tablet Take 1 tablet (10 mg total) by mouth daily with breakfast. Start 3-4 days prior to f/u visit in 2 months for catheter removal. 30 tablet 11   allopurinol (ZYLOPRIM) 300 MG tablet Take 1 tablet (300 mg total) by mouth daily. (Patient taking differently: Take 300 mg by mouth 3 (three) times a week.) 90 tablet 1   amLODipine (NORVASC) 10 MG tablet Take 1 tablet (10 mg total) by mouth daily. 90 tablet 3   benazepril (LOTENSIN) 40 MG tablet Take 1 tablet (40 mg total) by mouth daily. 90 tablet 3   finasteride (PROSCAR) 5 MG tablet  Take 1  tablet (5 mg total) by mouth daily. 90 tablet 3   levofloxacin (LEVAQUIN) 750 MG tablet Take one hour prior to prostate biopsy 1 tablet 0   naproxen sodium (ALEVE) 220 MG tablet Take 440 mg by mouth 2 (two) times daily as needed (knee pain.).     No current facility-administered medications for this visit.     Objective: Vital signs in last 24 hours: There were no vitals taken for this visit.  Intake/Output from previous day: No intake/output data recorded. Intake/Output this shift: _0 @   Physical Exam  Lab Results:  No results found for this or any previous visit (from the past 24 hour(s)).    BMET Recent Results (from the past 2160 hour(s))  Urinalysis, Routine w reflex microscopic Urine, Catheterized     Status: Abnormal   Collection Time: 10/01/21 10:20 AM  Result Value Ref Range   Color, Urine YELLOW YELLOW   APPearance CLEAR CLEAR   Specific Gravity, Urine 1.009 1.005 - 1.030   pH 6.0 5.0 - 8.0   Glucose, UA NEGATIVE NEGATIVE mg/dL   Hgb urine dipstick MODERATE (A) NEGATIVE   Bilirubin Urine NEGATIVE NEGATIVE   Ketones, ur NEGATIVE NEGATIVE mg/dL   Protein, ur >=300 (A) NEGATIVE mg/dL   Nitrite NEGATIVE NEGATIVE   Leukocytes,Ua NEGATIVE NEGATIVE   RBC / HPF >50 (H) 0 - 5 RBC/hpf   WBC, UA 0-5 0 - 5 WBC/hpf   Bacteria, UA NONE SEEN NONE SEEN    Comment: Performed at [facility], [address] [place], [place] [address]  Basic metabolic panel     Status: Abnormal   Collection Time: 10/02/21 10:28 AM  Result Value Ref Range   Sodium 138 135 - 145 mmol/L   Potassium 4.7 3.5 - 5.1 mmol/L   Chloride 104 98 - 111 mmol/L   CO2 27 22 - 32 mmol/L   Glucose, Bld 131 (H) 70 - 99 mg/dL    Comment: Glucose reference range applies only to samples taken after fasting for at least 8 hours.   BUN 20 8 - 23 mg/dL   Creatinine, Ser 1.53 (H) 0.61 - 1.24 mg/dL   Calcium 9.0 8.9 - 10.3 mg/dL   GFR, Estimated 47 (L) >60 mL/min    Comment: (NOTE) Calculated  using the CKD-EPI Creatinine Equation (2021)    Anion gap 7 5 - 15    Comment: Performed at [facility], [address] [place], [place] [address]  CBC with Differential     Status: Abnormal   Collection Time: 10/02/21 10:28 AM  Result Value Ref Range   WBC 9.4 4.0 - 10.5 K/uL   RBC 5.09 4.22 - 5.81 MIL/uL   Hemoglobin 13.2 13.0 - 17.0 g/dL   HCT 40.8 39.0 - 52.0 %   MCV 80.2 80.0 - 100.0 fL   MCH 25.9 (L) 26.0 - 34.0 pg   MCHC 32.4 30.0 - 36.0 g/dL   RDW 14.9 11.5 - 15.5 %   Platelets 243 150 - 400 K/uL   nRBC 0.0 0.0 - 0.2 %   Neutrophils Relative % 80 %   Neutro Abs 7.6 1.7 - 7.7 K/uL   Lymphocytes Relative 12 %   Lymphs Abs 1.1 0.7 - 4.0 K/uL   Monocytes Relative 6 %   Monocytes Absolute 0.6 0.1 - 1.0 K/uL   Eosinophils Relative 1 %   Eosinophils Absolute 0.1 0.0 - 0.5 K/uL   Basophils Relative 0 %   Basophils Absolute 0.0 0.0 - 0.1 K/uL  Immature Granulocytes 1 %   Abs Immature Granulocytes 0.08 (H) 0.00 - 0.07 K/uL    Comment: Performed at [facility], [address] [place], [place] [address]  Urinalysis, Routine w reflex microscopic Urine, Catheterized     Status: Abnormal   Collection Time: 10/02/21 10:48 AM  Result Value Ref Range   Color, Urine AMBER (A) YELLOW   APPearance HAZY (A) CLEAR   Specific Gravity, Urine 1.012 1.005 - 1.030   pH 6.0 5.0 - 8.0   Glucose, UA NEGATIVE NEGATIVE mg/dL   Hgb urine dipstick LARGE (A) NEGATIVE   Bilirubin Urine NEGATIVE NEGATIVE   Ketones, ur NEGATIVE NEGATIVE mg/dL   Protein, ur >=300 (A) NEGATIVE mg/dL   Nitrite NEGATIVE NEGATIVE   Leukocytes,Ua SMALL (A) NEGATIVE   RBC / HPF >50 (H) 0 - 5 RBC/hpf   WBC, UA >50 (H) 0 - 5 WBC/hpf   Bacteria, UA NONE SEEN NONE SEEN    Comment: Performed at [facility], [address] [place], [place] [address]  Urine Culture     Status: None   Collection Time: 10/02/21 10:48 AM   Specimen: Urine, Catheterized  Result Value Ref Range   Specimen Description      URINE,  CATHETERIZED Performed at [facility], [address] [place], [place] [address]    Special Requests      NONE Performed at [facility], [address], [place], [place] [address]    Culture      NO GROWTH Performed at [facility], [place] [address], [place], [place] [address]    Report Status 10/04/2021 FINAL   Urinalysis, Routine w reflex microscopic Urine, Catheterized     Status: Abnormal   Collection Time: 10/09/21 12:25 PM  Result Value Ref Range   Color, Urine YELLOW YELLOW   APPearance HAZY (A) CLEAR   Specific Gravity, Urine 1.011 1.005 - 1.030   pH 6.0 5.0 - 8.0   Glucose, UA NEGATIVE NEGATIVE mg/dL   Hgb urine dipstick LARGE (A) NEGATIVE   Bilirubin Urine NEGATIVE NEGATIVE   Ketones, ur NEGATIVE NEGATIVE mg/dL   Protein, ur 100 (A) NEGATIVE mg/dL   Nitrite NEGATIVE NEGATIVE   Leukocytes,Ua SMALL (A) NEGATIVE   RBC / HPF >50 (H) 0 - 5 RBC/hpf   WBC, UA 11-20 0 - 5 WBC/hpf   Bacteria, UA NONE SEEN NONE SEEN   Squamous Epithelial / LPF 0-5 0 - 5   Mucus PRESENT    Ca Oxalate Crys, UA PRESENT     Comment: Performed at [facility], [address] [place], [place] [address]  Basic metabolic panel     Status: Abnormal   Collection Time: 10/15/21  1:25 PM  Result Value Ref Range   Glucose 117 (H) 70 - 99 mg/dL   BUN 16 8 - 27 mg/dL   Creatinine, Ser 1.55 (H) 0.76 - 1.27 mg/dL   eGFR 46 (L) >59 mL/min/1.73   BUN/Creatinine Ratio 10 10 - 24   Sodium 139 134 - 144 mmol/L   Potassium 4.7 3.5 - 5.2 mmol/L   Chloride 104 96 - 106 mmol/L   CO2 24 20 - 29 mmol/L   Calcium 9.3 8.6 - 10.2 mg/dL  PSA, total and free     Status: Abnormal   Collection Time: 10/15/21  1:25 PM  Result Value Ref Range   Prostate Specific Ag, Serum 25.2 (H) 0.0 - 4.0 ng/mL    Comment: Roche ECLIA methodology. According to the American Urological Association,  Serum PSA should decrease and remain at undetectable levels after radical prostatectomy. The AUA defines biochemical recurrence as  an initial PSA value 0.2 ng/mL or greater followed by a subsequent confirmatory PSA value 0.2 ng/mL or greater. Values obtained with different assay methods or kits cannot be used interchangeably. Results cannot be interpreted as absolute evidence of the presence or absence of malignant disease.    PSA, Free 5.83 N/A ng/mL    Comment: Roche ECLIA methodology.   PSA, Free Pct 23.1 %    Comment: The table below lists the probability of prostate cancer for men with non-suspicious DRE results and total PSA between 4 and 10 ng/mL, by patient age Ricci Barker, Dean, 100:7121).                   % Free PSA       50-64 yr        65-75 yr                   0.00-10.00%        56%             55%                  10.01-15.00%        24%             35%                  15.01-20.00%        17%             23%                  20.01-25.00%        10%             20%                       >25.00%         5%              9% Please note:  Catalona et al did not make specific               recommendations regarding the use of               percent free PSA for any other population               of men.   Urinalysis, Routine w reflex microscopic Urine, Catheterized     Status: Abnormal   Collection Time: 10/16/21 11:16 AM  Result Value Ref Range   Color, Urine BROWN (A) YELLOW    Comment: BIOCHEMICALS MAY BE AFFECTED BY COLOR   APPearance TURBID (A) CLEAR   Specific Gravity, Urine 1.015 1.005 - 1.030   pH 7.0 5.0 - 8.0   Glucose, UA NEGATIVE NEGATIVE mg/dL   Hgb urine dipstick LARGE (A) NEGATIVE   Bilirubin Urine NEGATIVE NEGATIVE   Ketones, ur NEGATIVE NEGATIVE mg/dL   Protein, ur 100 (A) NEGATIVE mg/dL   Nitrite POSITIVE (A) NEGATIVE   Leukocytes,Ua SMALL (A) NEGATIVE    Comment: Performed at [facility], [address], [place], [place] [address]  Urinalysis, Microscopic (reflex)     Status: Abnormal   Collection Time: 10/16/21 11:16 AM  Result Value Ref Range   RBC / HPF >50 0 -  5 RBC/hpf   WBC, UA >50 0 - 5  WBC/hpf   Bacteria, UA MANY (A) NONE SEEN   Squamous Epithelial / LPF 0-5 0 - 5    Comment: Performed at [facility], [address] [place], [place] [address]  Urine Culture     Status: Abnormal   Collection Time: 10/16/21 11:16 AM   Specimen: Urine, Catheterized  Result Value Ref Range   Specimen Description      URINE, CATHETERIZED Performed at [facility], [address], [place], [place] [address]    Special Requests      NONE Performed at [facility], [address] [place], [place] [address]    Culture >=100,000 COLONIES/mL KLEBSIELLA PNEUMONIAE (A)    Report Status 10/19/2021 FINAL    Organism ID, Bacteria KLEBSIELLA PNEUMONIAE (A)       Susceptibility   Klebsiella pneumoniae - MIC*    AMPICILLIN >=32 RESISTANT Resistant     CEFAZOLIN <=4 SENSITIVE Sensitive     CEFEPIME <=0.12 SENSITIVE Sensitive     CEFTRIAXONE <=0.25 SENSITIVE Sensitive     CIPROFLOXACIN <=0.25 SENSITIVE Sensitive     GENTAMICIN <=1 SENSITIVE Sensitive     IMIPENEM <=0.25 SENSITIVE Sensitive     NITROFURANTOIN 128 RESISTANT Resistant     TRIMETH/SULFA <=20 SENSITIVE Sensitive     AMPICILLIN/SULBACTAM 4 SENSITIVE Sensitive     PIP/TAZO <=4 SENSITIVE Sensitive     * >=100,000 COLONIES/mL KLEBSIELLA PNEUMONIAE  Urinalysis, Routine w reflex microscopic     Status: Abnormal   Collection Time: 11/05/21 10:05 AM  Result Value Ref Range   Specific Gravity, UA 1.020 1.005 - 1.030   pH, UA 6.0 5.0 - 7.5   Color, UA Amber (A) Yellow   Appearance Ur Hazy (A) Clear   Leukocytes,UA 3+ (A) Negative   Protein,UA 3+ (A) Negative/Trace   Glucose, UA Negative Negative   Ketones, UA Negative Negative   RBC, UA 3+ (A) Negative   Bilirubin, UA Negative Negative   Urobilinogen, Ur 0.2 0.2 - 1.0 mg/dL   Nitrite, UA Positive (A) Negative   Microscopic Examination See below:   Microscopic Examination     Status: Abnormal   Collection Time: 11/05/21 10:05 AM   Urine  Result Value  Ref Range   WBC, UA >30 (A) 0 - 5 /hpf   RBC >30 (A) 0 - 2 /hpf   Epithelial Cells (non renal) 0-10 0 - 10 /hpf   Renal Epithel, UA None seen None seen /hpf   Mucus, UA Present Not Estab.   Bacteria, UA Many (A) None seen/Few  Urine Culture     Status: None   Collection Time: 11/05/21  1:51 PM   Specimen: Urine, Catheterized   UR  Result Value Ref Range   Urine Culture, Routine Final report    Organism ID, Bacteria Comment     Comment: Greater than 2 organisms recovered, none predominant. Please submit another sample if clinically indicated. Greater than 100,000 colony forming units per mL   Urine Culture     Status: None   Collection Time: 11/16/21  2:29 PM   Specimen: Urine   Urine  Result Value Ref Range   Urine Culture, Routine Final report    Organism ID, Bacteria Comment     Comment: Culture shows less than 10,000 colony forming units of bacteria per milliliter of urine. This colony count is not generally considered to be clinically significant.     Studies/Results: US Guided Needle Placement  Result Date: 11/12/2021 CLINICAL DATA:  Ultrasound was provided for use by  the ordering physician.  No provider Interpretation or professional fees incurred.    CT HEMATURIA WORKUP  Result Date: 10/30/2021 CLINICAL DATA:  Gross hematuria. EXAM: CT ABDOMEN AND PELVIS WITHOUT AND WITH CONTRAST TECHNIQUE: Multidetector CT imaging of the abdomen and pelvis was performed following the standard protocol before and following the bolus administration of intravenous contrast. CONTRAST:  170m OMNIPAQUE IOHEXOL 300 MG/ML  SOLN COMPARISON:  None. FINDINGS: Lower Chest: No acute findings. Hepatobiliary: No hepatic masses identified. Gallbladder is unremarkable. No evidence of biliary ductal dilatation. Pancreas:  No mass or inflammatory changes. Spleen: Within normal limits in size and appearance. Adrenals/Urinary Tract: No adrenal masses identified. No evidence of urolithiasis or  hydronephrosis. Multiple benign-appearing renal cysts are seen bilaterally. No complex cystic or solid renal masses identified. No masses seen involving the collecting systems, ureters, or bladder. Diffuse bladder wall thickening is seen, consistent with chronic bladder outlet obstruction. Markedly enlarged prostate gland shows mass effect on bladder base. Stomach/Bowel: No evidence of obstruction, inflammatory process or abnormal fluid collections. Normal appendix visualized. Vascular/Lymphatic: No pathologically enlarged lymph nodes. No acute vascular findings. Aortic atherosclerotic calcification noted. Reproductive: Markedly enlarged prostate gland is seen with mass effect on the bladder base. A Foley catheter is seen in place with the balloon located in the prostatic portion of the urethra. Other:  None. Musculoskeletal:  No suspicious bone lesions identified. IMPRESSION: No radiographic evidence of urinary tract neoplasm, urolithiasis, or hydronephrosis. Markedly enlarged prostate, with findings of chronic bladder outlet obstruction. Foley catheter balloon located in the prostatic portion of the urethra. Consider repositioning. Aortic Atherosclerosis (ICD10-I70.0). Electronically Signed   By: JMarlaine HindM.D.   On: 10/30/2021 13:55      Path report reviewed.   Assessment/Plan: Nodular prostate with retention and elevated PSA and small volume GG1 prostate cancer in 1 core.  I discussed options and will have him continue monthly foley changes and then return in 2 months for a voiding trial.  He will stay on finasteride and resume alfuzosin 3-4 days prior to f/u.  If he is unable to void, he may benefit from a simple prostatectomy.    Prostate cancer will just need observation at this time.     Meds ordered this encounter  Medications   alfuzosin (UROXATRAL) 10 MG 24 hr tablet    Sig: Take 1 tablet (10 mg total) by mouth daily with breakfast. Start 3-4 days prior to f/u visit in 2 months for  catheter removal.    Dispense:  30 tablet    Refill:  11        No orders of the defined types were placed in this encounter.     Return in about 2 months (around 01/27/2022) for He will need to continue monthly foley changes and then  need to return in 2 mo  for voiding trial .    CC: Dr. MTula Nakayama      JIrine Seal12/12/2020 [id number]: EDebbrah Alar male   DOB: 9October 30, 1947 75y.o.   MRN: [id number] ID: EKaenan Jake male   DOB: [birth year] 75y.o.   MRN: [id number]

## 2021-12-03 ENCOUNTER — Ambulatory Visit: Payer: Medicare HMO | Admitting: *Deleted

## 2021-12-03 DIAGNOSIS — E785 Hyperlipidemia, unspecified: Secondary | ICD-10-CM

## 2021-12-03 DIAGNOSIS — I10 Essential (primary) hypertension: Secondary | ICD-10-CM

## 2021-12-03 NOTE — Chronic Care Management (AMB) (Signed)
Chronic Care Management   CCM RN Visit Note  12/03/2021 Name: Carlos Leon MRN: [id number] DOB: 12-09-1946  Subjective: Carlos Leon is a 75 y.o. year old male who is a primary care patient of Carlos Helper, MD. The care management team was consulted for assistance with disease management and care coordination needs.    Engaged with patient by telephone for follow up visit in response to provider referral for case management and/or care coordination services.   Consent to Services:  The patient was given information about Chronic Care Management services, agreed to services, and gave verbal consent prior to initiation of services.  Please see initial visit note for detailed documentation.   Patient agreed to services and verbal consent obtained.   Assessment: Review of patient past medical history, allergies, medications, health status, including review of consultants reports, laboratory and other test data, was performed as part of comprehensive evaluation and provision of chronic care management services.   SDOH (Social Determinants of Health) assessments and interventions performed:    CCM Care Plan  Allergies  Allergen Reactions   Chlorthalidone Rash    Outpatient Encounter Medications as of 12/03/2021  Medication Sig Note   alfuzosin (UROXATRAL) 10 MG 24 hr tablet Take 1 tablet (10 mg total) by mouth daily with breakfast. Start 3-4 days prior to f/u visit in 2 months for catheter removal.    allopurinol (ZYLOPRIM) 300 MG tablet Take 1 tablet (300 mg total) by mouth daily. (Patient taking differently: Take 300 mg by mouth 3 (three) times a week.) 10/01/2021: Doesn't take daily   amLODipine (NORVASC) 10 MG tablet Take 1 tablet (10 mg total) by mouth daily.    benazepril (LOTENSIN) 40 MG tablet Take 1 tablet (40 mg total) by mouth daily.    finasteride (PROSCAR) 5 MG tablet Take 1 tablet (5 mg total) by mouth daily.    levofloxacin (LEVAQUIN) 750 MG tablet Take one hour  prior to prostate biopsy    naproxen sodium (ALEVE) 220 MG tablet Take 440 mg by mouth 2 (two) times daily as needed (knee pain.).    No facility-administered encounter medications on file as of 12/03/2021.    Patient Active Problem List   Diagnosis Date Noted   Encounter for Medicare annual examination with abnormal findings 11/25/2020   Refused H1N1 influenza virus immunization 09/17/2020   Hearing loss 11/25/2019   IDA (iron deficiency anemia) 11/16/2018   Poor urinary stream 09/30/2018   Asymptomatic proteinuria 08/08/2013   Elevated PSA 11/19/2012   Atrial fibrillation ([place]) 02/07/2012   Tubular adenoma of colon 10/27/2011   Hyperlipemia 08/26/2009   Morbid obesity ([place]) 04/18/2009   Prediabetes 10/21/2008   GOUT, UNSPECIFIED 10/21/2008   Essential hypertension, benign 10/21/2008    Conditions to be addressed/monitored:HTN and HLD  Care Plan : RN Care Manager Plan of Care  Updates made by [name], RN since 12/03/2021 12:00 AM     Problem: No plan of care established for management of chronic disease states  (HTN, HLD)   Priority: High     Long-Range Goal: Development of plan of care for chronic disease management (HTN, HLD)   Start Date: 12/03/2021  Expected End Date: 06/01/2022  Priority: High  Note:   Current Barriers:  Knowledge Deficits related to plan of care for management of HTN and HLD  Patient lives with wife, is independent in all aspects of his care, remains active in the community, plays golf regularly, attends church weekly, does not always eat heart healthy  but is trying, eats fast food at times, reports has all medications and taking as prescribed. Reports he has foley catheter temporarily due to prostate issues and will be having catheter removed in the near future, catheter is changed at urologist. Pt reports he now checks blood pressure daily and "readings are good". ASCVD risk enhancing conditions: age >75, pre-diabetes, HTN  RNCM Clinical  Goal(s):  Patient will verbalize understanding of plan for management of HTN and HLD as evidenced by pt report, review of EHR and  through collaboration with RN Care manager, provider, and care team.   Interventions: 1:1 collaboration with primary care provider regarding development and update of comprehensive plan of care as evidenced by provider attestation and co-signature Inter-disciplinary care team collaboration (see longitudinal plan of care) Evaluation of current treatment plan related to  self management and patient's adherence to plan as established by provider   Hyperlipidemia:  (Status: Goal on Track (progressing): YES.) Long Term Goal  Lab Results  Component Value Date   CHOL 177 07/06/2021   HDL 55 07/06/2021   LDLCALC 109 (H) 07/06/2021   LDLDIRECT 125 05/20/2016   TRIG 68 07/06/2021   CHOLHDL 3.2 07/06/2021     Medication review performed; medication list updated in electronic medical record.  Counseled on importance of regular laboratory monitoring as prescribed; Reviewed role and benefits of statin for ASCVD risk reduction; Reviewed importance of limiting foods high in cholesterol; Reviewed exercise goals and target of 150 minutes per week; Encouraged to look over and complete advanced directives Reviewed all upcoming scheduled appointments   Hypertension Interventions:  (Status:  Goal on track:  Yes.) Long Term Goal Last practice recorded BP readings:  BP Readings from Last 3 Encounters:  11/12/21 (!) 147/95  11/05/21 (!) 143/79  10/29/21 (!) 149/79  Most recent eGFR/CrCl:  Lab Results  Component Value Date   EGFR 46 (L) 10/15/2021    No components found for: CRCL  Reviewed medications with patient and discussed importance of compliance Counseled on the importance of exercise goals with target of 150 minutes per week Discussed complications of poorly controlled blood pressure such as heart disease, stroke, circulatory complications, vision complications,  kidney impairment, sexual dysfunction  Reinforced low sodium diet Reinforced importance of drinking adequate fluids (preferably water)  Reviewed infection prevention with foley catheter Patient Goals/Self-Care Activities: Take medications as prescribed   Attend all scheduled provider appointments Perform all self care activities independently  Perform IADL's (shopping, preparing meals, housekeeping, managing finances) independently Call provider office for new concerns or questions  check blood pressure daily keep a blood pressure log take blood pressure log to all doctor appointments take medications for blood pressure exactly as prescribed report new symptoms to your doctor - call for medicine refill 2 or 3 days before it runs out - call doctor with any symptoms you believe are related to your medicine - adhere to prescribed diet: heart healthy, low sodium Drink adequate fluids (preferably water) Practice good handwashing  Follow Up Plan:  Telephone follow up appointment with care management team member scheduled for:  02/18/2022      Plan:Telephone follow up appointment with care management team member scheduled for:  02/18/2022  Jacqlyn Larsen [facility], BSN RN Case Manager [facility] [phone]

## 2021-12-03 NOTE — Patient Instructions (Signed)
Visit Information  Thank you for taking time to visit with me today. Please don't hesitate to contact me if I can be of assistance to you before our next scheduled telephone appointment.  Following are the goals we discussed today:  Take medications as prescribed   Attend all scheduled provider appointments Perform all self care activities independently  Perform IADL's (shopping, preparing meals, housekeeping, managing finances) independently Call provider office for new concerns or questions  check blood pressure daily keep a blood pressure log take blood pressure log to all doctor appointments take medications for blood pressure exactly as prescribed report new symptoms to your doctor  call for medicine refill 2 or 3 days before it runs out call doctor with any symptoms you believe are related to your medicine adhere to prescribed diet: heart healthy, low sodium Drink adequate fluids (preferably water) Practice good handwashing  Our next appointment is by telephone on 02/18/2022 at 330 pm  Please call the care guide team at [phone] if you need to cancel or reschedule your appointment.   If you are experiencing a Mental Health or [place] or need someone to talk to, please call the [place] National Suicide Prevention Lifeline: 510-597-2395 or TTY: (828)883-2641 TTY 340-504-6106) to talk to a trained counselor call 1-800-273-TALK (toll free, 24 hour hotline) go to [facility] [address], [place] [id number]) call the [place]: [phone] call 911   Patient verbalizes understanding of instructions provided today and agrees to view in [place].   Jacqlyn Larsen [facility], BSN RN Case Manager [facility] [phone]  Indwelling Urinary Catheter Care, Adult An indwelling urinary catheter is a thin tube that is put into your bladder. The tube helps to drain pee (urine) out of your body. The  tube goes in through your urethra. Your urethra is where pee comes out of your body. Your pee will come out through the catheter, then it will go into a bag (drainage bag). Take good care of your catheter so it will work well. What are the risks? Germs may get into your bladder and cause an infection. The tube can become blocked. Tissue near the catheter may become irritated and may bleed. How to wear your catheter and drainage bag Supplies needed Sticky tape (adhesive tape) or a leg strap. Alcohol wipe or soap and water (if you use tape). A clean towel (if you use tape). Large overnight bag. Smaller bag (leg bag). Wearing your catheter Attach your catheter to your leg with tape or a leg strap. Make sure the catheter is not pulled tight. If a leg strap gets wet, take it off and put on a dry strap. If you use tape to hold the bag on your leg: Use an alcohol wipe or soap and water to wash your skin where the tape made it sticky before. Use a clean towel to pat-dry that skin. Use new tape to make the bag stay on your leg. Wearing your bags You should have been given a large overnight bag. You may wear the overnight bag in the day or night. Always have the overnight bag lower than your bladder.  Do not let the bag touch the floor. Before you go to sleep, put a clean plastic bag in a wastebasket. Then, hang the overnight bag inside the wastebasket. You should also have a smaller leg bag that fits under your clothes. Wear the leg bag as told by the product maker. This may be above  or below the knee, depending on the length of the tubing. Make sure that the leg bag is below the bladder. Make sure that the tubing does not have loops or too much tension. Do not wear your leg bag at night. How to care for your skin and catheter Supplies needed A clean washcloth. Water and mild soap. A clean towel. Caring for your skin and catheter   Clean the skin around your catheter every day. Wash  your hands with soap and water. Wet a clean washcloth in warm water and mild soap. Clean the skin around your urethra. If you are male: Gently spread the folds of skin around your vagina (labia). With the washcloth in your other hand, wipe the inner side of your labia on each side. Wipe from front to back. If you are male: Pull back any skin that covers the end of your penis (foreskin). With the washcloth in your other hand, wipe your penis in small circles. Start wiping at the tip of your penis, then move away from the catheter. Move the foreskin back in place, if needed. With your free hand, hold the catheter close to where it goes into your body. Keep holding the catheter during cleaning so it does not get pulled out. With the washcloth in your other hand, clean the catheter. Only wipe downward on the catheter, toward the drainage bag. Do not wipe upward toward your body. Doing this may push germs into your urethra and cause infection. Use a clean towel to pat-dry the catheter and the skin around it. Make sure to wipe off all soap. Wash your hands with soap and water. Shower every day. Do not take baths. Do not use cream, ointment, or lotion on the area where the catheter goes into your body, unless your doctor tells you to. Do not use powders, sprays, or lotions on your genital area. Check your skin around the catheter every day for signs of infection. Check for: Redness, swelling, or pain. Fluid or blood. Warmth. Pus or a bad smell. How to empty the bag Supplies needed Rubbing alcohol. Gauze pad or cotton ball. Tape or a leg strap. Emptying the bag Pour the pee out of your bag when it is ?- full, or at least 2-3 times a day. Do this for your overnight bag and your leg bag. Wash your hands with soap and water. Separate (detach) the bag from your leg. Hold the bag over the toilet or a clean pail. Keep the bag lower than your hips and bladder. This is so the pee (urine) does  not go back into the tube. Open the pour spout. It is at the bottom of the bag. Empty the pee into the toilet or pail. Do not let the pour spout touch any surface. Put rubbing alcohol on a gauze pad or cotton ball. Use the gauze pad or cotton ball to clean the pour spout. Close the pour spout. Attach the bag to your leg with tape or a leg strap. Wash your hands with soap and water. Follow instructions for cleaning the drainage bag. Instructions can come from: The product maker. Your doctor. How to change the bag Changing the bag Replace your bag when it starts to leak, smell bad, or look dirty. Wash your hands with soap and water. Separate the dirty bag from your leg. Pinch the catheter with your fingers so that pee does not spill out. Separate the catheter tube from the bag tube where these tubes connect (at the  connection valve). Do not let the tubes touch any surface. Clean the end of the catheter tube with an alcohol wipe. Use a different alcohol wipe to clean the end of the bag tube. Connect the catheter tube to the tube of the clean bag. Attach the clean bag to your leg with tape or a leg strap. Do not make the bag tight on your leg. Wash your hands with soap and water. General instructions  Never pull on your catheter. Never try to take it out. Doing that can hurt you. Always wash your hands before and after you touch your catheter or bag. Use a mild, fragrance-free soap. If you do not have soap and water, use hand sanitizer. Always make sure there are no twists, bends, or kinks in the catheter tube. Always make sure there are no leaks in the catheter or bag. Drink enough fluid to keep your pee pale yellow. Do not take baths, swim, or use a hot tub. If you are male, wipe from front to back after you poop (have a bowel movement). Contact a doctor if: Your catheter gets clogged. Your catheter leaks. You have signs of infection at the catheter site, such as: Redness,  swelling, or pain where the catheter goes into your body. Fluid, blood, pus, or a bad smell coming from the area where the catheter goes into your body. Skin feels warm where the catheter goes into your body. You have signs of a bladder infection, such as: Fever. Chills. Pee smells worse than usual. Cloudy pee. Pain in your belly, legs, lower back, or bladder. Vomiting or feel like vomiting. Get help right away if: You see blood in the catheter. Your pee is pink or red. Your bladder feels full. Your pee is not draining into the bag. Your catheter gets pulled out. Summary An indwelling urinary catheter is a thin tube that is placed into the bladder to help drain pee (urine) out of the body. The catheter is placed into the part of the body that drains pee from the bladder (urethra). Taking good care of your catheter will keep it working well. Always wash your hands before and after touching your catheter or bag. Never pull on your catheter or try to take it out. This information is not intended to replace advice given to you by your health care provider. Make sure you discuss any questions you have with your health care provider. Document Revised: 08/13/2021 Document Reviewed: 08/13/2021 Elsevier Patient Education  2022 [employer].

## 2021-12-04 ENCOUNTER — Other Ambulatory Visit: Payer: Self-pay | Admitting: Family Medicine

## 2021-12-07 ENCOUNTER — Ambulatory Visit (HOSPITAL_COMMUNITY): Admission: RE | Admit: 2021-12-07 | Payer: Medicare HMO | Source: Ambulatory Visit

## 2021-12-10 ENCOUNTER — Other Ambulatory Visit: Payer: Medicare HMO | Admitting: Urology

## 2021-12-13 ENCOUNTER — Emergency Department (HOSPITAL_COMMUNITY): Payer: Medicare HMO

## 2021-12-13 ENCOUNTER — Inpatient Hospital Stay (HOSPITAL_COMMUNITY)
Admission: EM | Admit: 2021-12-13 | Discharge: 2021-12-15 | DRG: 390 | Disposition: A | Discharge status: Home / Self Care | Payer: Medicare HMO | Attending: Internal Medicine | Admitting: Internal Medicine

## 2021-12-13 ENCOUNTER — Other Ambulatory Visit: Payer: Self-pay

## 2021-12-13 ENCOUNTER — Encounter (HOSPITAL_COMMUNITY): Payer: Self-pay | Admitting: *Deleted

## 2021-12-13 DIAGNOSIS — I4891 Unspecified atrial fibrillation: Secondary | ICD-10-CM | POA: Diagnosis present

## 2021-12-13 DIAGNOSIS — Z20822 Contact with and (suspected) exposure to covid-19: Secondary | ICD-10-CM | POA: Diagnosis not present

## 2021-12-13 DIAGNOSIS — Z833 Family history of diabetes mellitus: Secondary | ICD-10-CM

## 2021-12-13 DIAGNOSIS — K5730 Diverticulosis of large intestine without perforation or abscess without bleeding: Secondary | ICD-10-CM | POA: Diagnosis present

## 2021-12-13 DIAGNOSIS — K8689 Other specified diseases of pancreas: Secondary | ICD-10-CM | POA: Diagnosis not present

## 2021-12-13 DIAGNOSIS — R109 Unspecified abdominal pain: Secondary | ICD-10-CM | POA: Diagnosis not present

## 2021-12-13 DIAGNOSIS — N1831 Chronic kidney disease, stage 3a: Secondary | ICD-10-CM | POA: Diagnosis not present

## 2021-12-13 DIAGNOSIS — Z8249 Family history of ischemic heart disease and other diseases of the circulatory system: Secondary | ICD-10-CM

## 2021-12-13 DIAGNOSIS — R338 Other retention of urine: Secondary | ICD-10-CM | POA: Diagnosis present

## 2021-12-13 DIAGNOSIS — Z6834 Body mass index (BMI) 34.0-34.9, adult: Secondary | ICD-10-CM | POA: Diagnosis not present

## 2021-12-13 DIAGNOSIS — Y738 Miscellaneous gastroenterology and urology devices associated with adverse incidents, not elsewhere classified: Secondary | ICD-10-CM | POA: Diagnosis present

## 2021-12-13 DIAGNOSIS — Z8546 Personal history of malignant neoplasm of prostate: Secondary | ICD-10-CM

## 2021-12-13 DIAGNOSIS — N2889 Other specified disorders of kidney and ureter: Secondary | ICD-10-CM | POA: Diagnosis present

## 2021-12-13 DIAGNOSIS — K56609 Unspecified intestinal obstruction, unspecified as to partial versus complete obstruction: Secondary | ICD-10-CM | POA: Diagnosis present

## 2021-12-13 DIAGNOSIS — Z888 Allergy status to other drugs, medicaments and biological substances status: Secondary | ICD-10-CM | POA: Diagnosis not present

## 2021-12-13 DIAGNOSIS — K56600 Partial intestinal obstruction, unspecified as to cause: Secondary | ICD-10-CM | POA: Diagnosis not present

## 2021-12-13 DIAGNOSIS — I10 Essential (primary) hypertension: Secondary | ICD-10-CM | POA: Diagnosis not present

## 2021-12-13 DIAGNOSIS — N401 Enlarged prostate with lower urinary tract symptoms: Secondary | ICD-10-CM | POA: Diagnosis present

## 2021-12-13 DIAGNOSIS — Z87891 Personal history of nicotine dependence: Secondary | ICD-10-CM

## 2021-12-13 DIAGNOSIS — M1990 Unspecified osteoarthritis, unspecified site: Secondary | ICD-10-CM | POA: Diagnosis present

## 2021-12-13 DIAGNOSIS — E1122 Type 2 diabetes mellitus with diabetic chronic kidney disease: Secondary | ICD-10-CM | POA: Diagnosis present

## 2021-12-13 DIAGNOSIS — E785 Hyperlipidemia, unspecified: Secondary | ICD-10-CM | POA: Diagnosis present

## 2021-12-13 DIAGNOSIS — I129 Hypertensive chronic kidney disease with stage 1 through stage 4 chronic kidney disease, or unspecified chronic kidney disease: Secondary | ICD-10-CM | POA: Diagnosis not present

## 2021-12-13 DIAGNOSIS — M109 Gout, unspecified: Secondary | ICD-10-CM | POA: Diagnosis present

## 2021-12-13 DIAGNOSIS — Z79899 Other long term (current) drug therapy: Secondary | ICD-10-CM

## 2021-12-13 DIAGNOSIS — I4821 Permanent atrial fibrillation: Secondary | ICD-10-CM

## 2021-12-13 DIAGNOSIS — N183 Chronic kidney disease, stage 3 unspecified: Secondary | ICD-10-CM

## 2021-12-13 DIAGNOSIS — T83021A Displacement of indwelling urethral catheter, initial encounter: Secondary | ICD-10-CM | POA: Diagnosis present

## 2021-12-13 LAB — COMPREHENSIVE METABOLIC PANEL
ALT: 15 U/L (ref 0–44)
AST: 15 U/L (ref 15–41)
Albumin: 3.8 g/dL (ref 3.5–5.0)
Alkaline Phosphatase: 91 U/L (ref 38–126)
Anion gap: 7 (ref 5–15)
BUN: 14 mg/dL (ref 8–23)
CO2: 29 mmol/L (ref 22–32)
Calcium: 9.4 mg/dL (ref 8.9–10.3)
Chloride: 100 mmol/L (ref 98–111)
Creatinine, Ser: 1.27 mg/dL — ABNORMAL HIGH (ref 0.61–1.24)
GFR, Estimated: 59 mL/min — ABNORMAL LOW (ref >60)
Glucose, Bld: 126 mg/dL — ABNORMAL HIGH (ref 70–99)
Potassium: 4.1 mmol/L (ref 3.5–5.1)
Sodium: 136 mmol/L (ref 135–145)
Total Bilirubin: 0.6 mg/dL (ref 0.3–1.2)
Total Protein: 7.1 g/dL (ref 6.5–8.1)

## 2021-12-13 LAB — CBC WITH DIFFERENTIAL/PLATELET
Abs Immature Granulocytes: 0.06 10*3/uL (ref 0.00–0.07)
Basophils Absolute: 0 10*3/uL (ref 0.0–0.1)
Basophils Relative: 0 %
Eosinophils Absolute: 0.3 10*3/uL (ref 0.0–0.5)
Eosinophils Relative: 2 %
HCT: 41.3 % (ref 39.0–52.0)
Hemoglobin: 13.4 g/dL (ref 13.0–17.0)
Immature Granulocytes: 1 %
Lymphocytes Relative: 6 %
Lymphs Abs: 0.7 10*3/uL (ref 0.7–4.0)
MCH: 26.3 pg (ref 26.0–34.0)
MCHC: 32.4 g/dL (ref 30.0–36.0)
MCV: 81 fL (ref 80.0–100.0)
Monocytes Absolute: 0.4 10*3/uL (ref 0.1–1.0)
Monocytes Relative: 4 %
Neutro Abs: 10.1 10*3/uL — ABNORMAL HIGH (ref 1.7–7.7)
Neutrophils Relative %: 87 %
Platelets: 216 10*3/uL (ref 150–400)
RBC: 5.1 MIL/uL (ref 4.22–5.81)
RDW: 15.7 % — ABNORMAL HIGH (ref 11.5–15.5)
WBC: 11.6 10*3/uL — ABNORMAL HIGH (ref 4.0–10.5)
nRBC: 0 % (ref 0.0–0.2)

## 2021-12-13 LAB — RESP PANEL BY RT-PCR (FLU A&B, COVID) ARPGX2
Influenza A by PCR: NEGATIVE
Influenza B by PCR: NEGATIVE
SARS Coronavirus 2 by RT PCR: NEGATIVE

## 2021-12-13 LAB — LIPASE, BLOOD: Lipase: 28 U/L (ref 11–51)

## 2021-12-13 MED ORDER — POLYETHYLENE GLYCOL 3350 17 G PO PACK: 17.0000 g | PACK | Freq: Every day | ORAL | Status: DC | PRN

## 2021-12-13 MED ORDER — ACETAMINOPHEN 325 MG PO TABS: 650.0000 mg | ORAL_TABLET | Freq: Four times a day (QID) | ORAL | Status: DC | PRN

## 2021-12-13 MED ORDER — HEPARIN SODIUM (PORCINE) 5000 UNIT/ML IJ SOLN
5000.0000 [IU] | Freq: Three times a day (TID) | INTRAMUSCULAR | Status: DC
  Administered 2021-12-13 – 2021-12-15 (×6): 5000 [IU] via SUBCUTANEOUS
  Filled 2021-12-13 (×6): qty 1

## 2021-12-13 MED ORDER — MORPHINE SULFATE (PF) 4 MG/ML IV SOLN
4.0000 mg | Freq: Once | INTRAVENOUS | Status: AC
  Administered 2021-12-13: 15:00:00 4 mg via INTRAVENOUS
  Filled 2021-12-13: qty 1

## 2021-12-13 MED ORDER — SODIUM CHLORIDE 0.9 % IV SOLN: INTRAVENOUS | Status: DC

## 2021-12-13 MED ORDER — ONDANSETRON HCL 4 MG/2ML IJ SOLN
4.0000 mg | Freq: Four times a day (QID) | INTRAMUSCULAR | Status: DC | PRN
  Administered 2021-12-14 (×2): 4 mg via INTRAVENOUS
  Filled 2021-12-13 (×3): qty 2

## 2021-12-13 MED ORDER — ACETAMINOPHEN 650 MG RE SUPP: 650.0000 mg | Freq: Four times a day (QID) | RECTAL | Status: DC | PRN

## 2021-12-13 MED ORDER — ONDANSETRON HCL 4 MG/2ML IJ SOLN
4.0000 mg | Freq: Once | INTRAMUSCULAR | Status: AC
  Administered 2021-12-13: 15:00:00 4 mg via INTRAVENOUS
  Filled 2021-12-13: qty 2

## 2021-12-13 MED ORDER — MORPHINE SULFATE (PF) 2 MG/ML IV SOLN
2.0000 mg | INTRAVENOUS | Status: DC | PRN
  Administered 2021-12-13 – 2021-12-14 (×3): 2 mg via INTRAVENOUS
  Filled 2021-12-13 (×5): qty 1

## 2021-12-13 MED ORDER — IOHEXOL 300 MG/ML  SOLN
100.0000 mL | Freq: Once | INTRAMUSCULAR | Status: AC | PRN
  Administered 2021-12-13: 16:00:00 100 mL via INTRAVENOUS

## 2021-12-13 MED ORDER — ONDANSETRON HCL 4 MG PO TABS: 4.0000 mg | ORAL_TABLET | Freq: Four times a day (QID) | ORAL | Status: DC | PRN

## 2021-12-13 NOTE — ED Provider Notes (Signed)
[facility] EMERGENCY DEPARTMENT Provider Note   CSN: [id number] Arrival date & time: 12/13/21  1401     History Chief Complaint  Patient presents with   Abdominal Pain    Carlos Leon is a 75 y.o. male with a history including atrial fibrillation, diverticulosis, hypertension, hyperlipidemia and history of gout presenting for evaluation of sudden onset of supra umbilical abdominal pain which is described as very sharp in character, intermittent episodes with nausea and 2 episodes of vomiting today prior to arrival.  He thought he had a subjective fever yesterday evening when his symptoms first began.  He denies chest pain or shortness of breath, denies diarrhea, had a normal bowel movement yesterday and this morning had a very small bowel movement which did not affect his pain.  Denies flatus.  He denies history of constipation.  He has a chronic indwelling urinary catheter, denies any problems or concerns with this.  He was able to keep down castor oil 2 hours ago but this is had no effect on his symptoms.  Pain does not radiate into his back.  He denies abdominal distention.  The history is provided by the patient.      Past Medical History:  Diagnosis Date   Arthritis    Atrial fibrillation ([place])    Present by ECG 01/2012   Colon polyps    Diverticulosis of colon    Erectile dysfunction    Essential hypertension    Gout    Hyperlipidemia    Metabolic syndrome X 6/0/6301   Obesity     Patient Active Problem List   Diagnosis Date Noted   SBO (small bowel obstruction) ([place]) 12/13/2021   Encounter for Medicare annual examination with abnormal findings 11/25/2020   Refused H1N1 influenza virus immunization 09/17/2020   Hearing loss 11/25/2019   IDA (iron deficiency anemia) 11/16/2018   Poor urinary stream 09/30/2018   Asymptomatic proteinuria 08/08/2013   Elevated PSA 11/19/2012   Atrial fibrillation ([place]) 02/07/2012   Tubular adenoma of colon 10/27/2011    Hyperlipemia 08/26/2009   Morbid obesity ([place]) 04/18/2009   Prediabetes 10/21/2008   GOUT, UNSPECIFIED 10/21/2008   Essential hypertension, benign 10/21/2008    Past Surgical History:  Procedure Laterality Date   Colon polyps removed  2002   Dr. Tamala Julian   COLONOSCOPY  2004   Dr. Misty Stanley prep, ACBE showed diverticulosis   COLONOSCOPY  11/24/2011   Procedure: COLONOSCOPY;  Surgeon: Daneil Dolin, MD;  Location[facility] END[facility];  Service: Endoscopy;  Laterality: N/A;  9:10   COLONOSCOPY N/A 03/26/2020   Procedure: COLONOSCOPY;  Surgeon: Daneil Dolin, MD;  Location: [facility] ENDO SUITE;  Service: Endoscopy;  Laterality: N/A;  9:30   HERNIA REPAIR     PROSTATE BIOPSY         Family History  Problem Relation Age of Onset   Hypertension Mother    Cancer Mother        Rectal, approximately 45   Diabetes Father    Alzheimer's disease Father    Liver disease Neg Hx    Colon cancer Neg Hx     Social History   Tobacco Use   Smoking status: Former    Packs/day: 2.00    Years: 5.00    Pack years: 10.00    Types: Cigarettes   Smokeless tobacco: Never  Vaping Use   Vaping Use: Never used  Substance Use Topics   Alcohol use: No   Drug use: No    Home Medications  Prior to Admission medications   Medication Sig Start Date End Date Taking? Authorizing Provider  alfuzosin (UROXATRAL) 10 MG 24 hr tablet Take 1 tablet (10 mg total) by mouth daily with breakfast. Start 3-4 days prior to f/u visit in 2 months for catheter removal. 11/26/21   Irine Seal, MD  allopurinol (ZYLOPRIM) 300 MG tablet TAKE 1 TABLET EVERY DAY 12/04/21   Paseda, Dewaine Conger, FNP  amLODipine (NORVASC) 10 MG tablet Take 1 tablet (10 mg total) by mouth daily. 07/20/21   [name], MD  benazepril (LOTENSIN) 40 MG tablet Take 1 tablet (40 mg total) by mouth daily. 07/20/21   [name], MD  finasteride (PROSCAR) 5 MG tablet Take 1 tablet (5 mg total) by mouth daily. 10/29/21   Irine Seal, MD   levofloxacin [facility]) 750 MG tablet Take one hour prior to prostate biopsy 11/05/21   Irine Seal, MD  naproxen sodium (ALEVE) 220 MG tablet Take 440 mg by mouth 2 (two) times daily as needed (knee pain.).    [provider]    Allergies    Chlorthalidone  Review of Systems   Review of Systems  Constitutional:  Positive for fever.  HENT:  Negative for congestion and sore throat.   Eyes: Negative.   Respiratory:  Negative for chest tightness and shortness of breath.   Cardiovascular:  Negative for chest pain.  Gastrointestinal:  Positive for abdominal pain, nausea and vomiting.  Genitourinary: Negative.   Musculoskeletal:  Negative for arthralgias, joint swelling and neck pain.  Skin: Negative.  Negative for rash and wound.  Neurological:  Negative for dizziness, weakness, light-headedness, numbness and headaches.  Psychiatric/Behavioral: Negative.    All other systems reviewed and are negative.  Physical Exam Updated Vital Signs BP 125/64    Pulse 71    Temp 98 F (36.7 C) (Oral)    Resp 13    Ht 6\' 1"  (1.854 m)    Wt 121.6 kg    SpO2 97%    BMI 35.36 kg/m   Physical Exam Vitals and nursing note reviewed.  Constitutional:      Appearance: He is well-developed.  HENT:     Head: Normocephalic and atraumatic.  Eyes:     Conjunctiva/sclera: Conjunctivae normal.  Cardiovascular:     Rate and Rhythm: Normal rate and regular rhythm.     Heart sounds: Normal heart sounds.  Pulmonary:     Effort: Pulmonary effort is normal.     Breath sounds: Normal breath sounds. No wheezing.  Abdominal:     General: Abdomen is protuberant. Bowel sounds are increased.     Palpations: Abdomen is soft.     Tenderness: There is abdominal tenderness in the epigastric area and periumbilical area. There is no guarding or rebound.  Musculoskeletal:        General: Normal range of motion.     Cervical back: Normal range of motion.  Skin:    General: Skin is warm and dry.  Neurological:      Mental Status: He is alert.    ED Results / Procedures / Treatments   Labs (all labs ordered are listed, but only abnormal results are displayed) Labs Reviewed  CBC WITH DIFFERENTIAL/PLATELET - Abnormal; Notable for the following components:      Result Value   WBC 11.6 (*)    RDW 15.7 (*)    Neutro Abs 10.1 (*)    All other components within normal limits  COMPREHENSIVE METABOLIC PANEL - Abnormal; Notable  for the following components:   Glucose, Bld 126 (*)    Creatinine, Ser 1.27 (*)    GFR, Estimated 59 (*)    All other components within normal limits  LIPASE, BLOOD    EKG EKG Interpretation  Date/Time:  Sunday December 13 2021 14:20:27 EST Ventricular Rate:  95 PR Interval:    QRS Duration: 118 QT Interval:  394 QTC Calculation: 461 R Axis:   -22 Text Interpretation: Atrial fibrillation Left ventricular hypertrophy Borderline T abnormalities, inferior leads No old tracing to compare Confirmed by Daleen Bo [phone]) on 12/13/2021 4:09:48 PM  Radiology CT ABDOMEN PELVIS W CONTRAST  Result Date: 12/13/2021 CLINICAL DATA:  Bowel obstruction suspected. Abdominal pain with nausea with onset last night. Patient has indwelling Foley catheter. EXAM: CT ABDOMEN AND PELVIS WITH CONTRAST TECHNIQUE: Multidetector CT imaging of the abdomen and pelvis was performed using the standard protocol following bolus administration of intravenous contrast. CONTRAST:  187mL OMNIPAQUE IOHEXOL 300 MG/ML  SOLN COMPARISON:  100 mL of Omnipaque 300 intravenous. FINDINGS: Lower chest: No acute abnormality. Hepatobiliary: No focal liver abnormality is seen. No gallstones, gallbladder wall thickening, or biliary dilatation. Pancreas: Dilatation of the main pancreatic duct within the pancreatic head neck and proximal body, measuring up to 0.5 cm in diameter (series 2, images 21, 24). There is diffuse mild parenchymal atrophy. No peripancreatic fat stranding or fluid collection. Spleen: Normal in size  without focal abnormality. Adrenals/Urinary Tract: Adrenal glands are unremarkable. A 1.8 cm partially exophytic solid mass with attenuation of 48 Hounsfield units is noted at the upper pole of the right kidney, best appreciated on coronal and sagittal views (series 5, image 82; series 6, image 60). Multiple bilateral simple cortical renal cysts measuring up to 5.8 cm at the lower third of the left kidney (series 2, image 35). No hydronephrosis or nephrolithiasis. Minimally distended urinary bladder with Foley catheter in place. Foley catheter balloon is at the level of the prostatic urethra. Stomach/Bowel: Small hiatal hernia. A 2.1 cm air and fluid-filled diverticulum is noted along the for segment of the duodenum (series 2, image 29). Multiple loops of dilated proximal small bowel measure up to 3.3 cm in diameter. In the mid left abdomen there is a segment of small bowel with small bowel feces sign and gradual tapering to normal caliber (series 2, images 44-55; series 5, image 56). No distinct transition point. There is a trace amount of interloop fluid along this segment of small bowel as well as mild mesenteric edema. The small bowel distal to this area of tapering is decompressed. The appendix appears normal. Vascular/Lymphatic: Aortic atherosclerosis. No enlarged abdominal or pelvic lymph nodes. Reproductive: Stable appearance of the markedly enlarged prostate gland with mass effect on the posterior aspect of the urinary bladder. Other: Trace amount of simple free fluid is noted in the lower abdomen and pelvis. No free intraperitoneal air. Musculoskeletal: Severe degenerative disc disease at the L2-L5 levels with vacuum disc phenomenon. No acute osseous abnormality or suspicious osseous lesion. IMPRESSION: 1. At least partial small bowel obstruction with gradual tapering of small bowel caliber in the mid left abdomen. No distinct transition point though the distal small bowel is decompressed. 2. Trace  abdominopelvic ascites. 3. Foley catheter balloon is at the level of the prostatic urethra. Recommend repositioning. 4. Dilated main pancreatic duct within the pancreatic head, neck, and proximal body. When the patient is clinically stable and able to follow directions and hold their breath (preferably as an outpatient) further evaluation with dedicated  abdominal MRI should be considered. 5. Partially exophytic 1.8 cm solid mass at the superior pole of the right kidney. This can also be evaluated on abdominal MRI. 6. Prostatomegaly, unchanged. Electronically Signed   By: Ileana Roup M.D.   On: 12/13/2021 17:06    Procedures Procedures   Medications Ordered in ED Medications  morphine 4 MG/ML injection 4 mg (4 mg Intravenous Given 12/13/21 1516)  ondansetron (ZOFRAN) injection 4 mg (4 mg Intravenous Given 12/13/21 1517)  iohexol (OMNIPAQUE) 300 MG/ML solution 100 mL (100 mLs Intravenous Contrast Given 12/13/21 1625)    ED Course  I have reviewed the triage vital signs and the nursing notes.  Pertinent labs & imaging results that were available during my care of the patient were reviewed by me and considered in my medical decision making (see chart for details).    MDM Rules/Calculators/A&P                         Labs and imaging reviewed and discussed with pt and wife at bedside.  Discussed diagnosis of sbo and need for admission and he is agreeable.  Currently pain free, reports vomiting x 1 since here, no active n/v currently, do not believe he would benefit from NG tube at this time.  Pancreatic duct dilation of unclear etiology, LFT's and lipase normal.   Call placed to hospitalist for admission.  Discussed with Dr. Arlyce Dice who accepts pt for admission.      Final Clinical Impression(s) / ED Diagnoses Final diagnoses:  Small bowel obstruction [facility])    Rx / DC Orders ED Discharge Orders     None        Landis Martins 12/13/21 1809    Daleen Bo, MD 12/13/21  2303

## 2021-12-13 NOTE — ED Notes (Signed)
Patient transported to CT 

## 2021-12-13 NOTE — ED Triage Notes (Signed)
Abdominal pain with nausea onset last night, also c/o constipation ,has a indwelling foley catheter

## 2021-12-13 NOTE — H&P (Signed)
History and Physical    Carlos Leon TIR:[id number] DOB: Jun 14, 1946 DOA: 12/13/2021  PCP: [name], MD   Patient coming from: home  I have personally briefly reviewed patient's old medical records in [place]  Chief Complaint: Abdominal pain  HPI: Carlos Leon is a 75 y.o. male with medical history significant for Atria fibrillation, htn, obesity, gout, prostate cancer, indwelling Foley catheter Patient presented to the ED with complaints of abdominal pain and nausea restarted today.  Abdominal pain is mostly upper abdomen.  He reports dry heaving at home, and then an episode of vomiting in the ED today.  No prior episodes of abdominal pain.  No history of abdominal surgeries.  He has been feeling constipated, last bowel movement was today and was small in amount.   ED Course: Stable vitals.  WBC 11.6.  Lipase 28.  CT abdomen and pelvis-at least partial small bowel obstruction with gradual tapering of small bowel caliber in the mid left abdomen.  Also other incidental findings involving the main pancreatic duct and right kidney. Hospitalist to admit for small bowel obstruction.  Review of Systems: As per HPI all other systems reviewed and negative.  Past Medical History:  Diagnosis Date   Arthritis    Atrial fibrillation [facility])    Present by ECG 01/2012   Colon polyps    Diverticulosis of colon    Erectile dysfunction    Essential hypertension    Gout    Hyperlipidemia    Metabolic syndrome X 06/02/6194   Obesity     Past Surgical History:  Procedure Laterality Date   Colon polyps removed  2002   Dr. Tamala Julian   COLONOSCOPY  2004   Dr. Misty Stanley prep, ACBE showed diverticulosis   COLONOSCOPY  11/24/2011   Procedure: COLONOSCOPY;  Surgeon: Daneil Dolin, MD;  Location[facility]O [facility];  Service: Endoscopy;  Laterality: N/A;  9:10   COLONOSCOPY N/A 03/26/2020   Procedure: COLONOSCOPY;  Surgeon: Daneil Dolin, MD;  Location[facility];  Service:  Endoscopy;  Laterality: N/A;  9:30   HERNIA REPAIR     PROSTATE BIOPSY       reports that he has quit smoking. His smoking use included cigarettes. He has a 10.00 pack-year smoking history. He has never used smokeless tobacco. He reports that he does not drink alcohol and does not use drugs.  Allergies  Allergen Reactions   Chlorthalidone Rash    Family History  Problem Relation Age of Onset   Hypertension Mother    Cancer Mother        Rectal, approximately 42   Diabetes Father    Alzheimer's disease Father    Liver disease Neg Hx    Colon cancer Neg Hx     Prior to Admission medications   Medication Sig Start Date End Date Taking? Authorizing Provider  allopurinol (ZYLOPRIM) 300 MG tablet TAKE 1 TABLET EVERY DAY Patient taking differently: Take 300 mg by mouth. 3 times a week 12/04/21  Yes Paseda, Dewaine Conger, FNP  amLODipine (NORVASC) 10 MG tablet Take 1 tablet (10 mg total) by mouth daily. 07/20/21  Yes [name], MD  benazepril (LOTENSIN) 40 MG tablet Take 1 tablet (40 mg total) by mouth daily. 07/20/21  Yes [name], MD  finasteride (PROSCAR) 5 MG tablet Take 1 tablet (5 mg total) by mouth daily. 10/29/21  Yes Irine Seal, MD  naproxen sodium (ALEVE) 220 MG tablet Take 440 mg by mouth 2 (two) times  daily as needed (knee pain.).   Yes [provider]  alfuzosin (UROXATRAL) 10 MG 24 hr tablet Take 1 tablet (10 mg total) by mouth daily with breakfast. Start 3-4 days prior to f/u visit in 2 months for catheter removal. Patient not taking: Reported on 12/13/2021 11/26/21   Irine Seal, MD  levofloxacin [facility]) 750 MG tablet Take one hour prior to prostate biopsy Patient not taking: Reported on 12/13/2021 11/05/21   Irine Seal, MD    Physical Exam: Vitals:   12/13/21 1800 12/13/21 1930 12/13/21 2028 12/13/21 2038  BP: 125/64 135/88  124/66  Pulse: 71 80  87  Resp: 13 15  13   Temp:   98.1 F (36.7 C)   TempSrc:   Oral   SpO2: 97% 98%  98%   Weight:      Height:        Constitutional: NAD, calm, comfortable Vitals:   12/13/21 1800 12/13/21 1930 12/13/21 2028 12/13/21 2038  BP: 125/64 135/88  124/66  Pulse: 71 80  87  Resp: 13 15  13   Temp:   98.1 F (36.7 C)   TempSrc:   Oral   SpO2: 97% 98%  98%  Weight:      Height:       Eyes: PERRL, lids and conjunctivae normal ENMT: Mucous membranes are moist.   Neck: normal, supple, no masses, no thyromegaly Respiratory: clear to auscultation bilaterally, no wheezing, no crackles. Normal respiratory effort. No accessory muscle use.  Cardiovascular: Regular rate and rhythm, no murmurs / rubs / gallops. No extremity edema.  Lower extremities warm and well-perfused  abdomen: no tenderness, no masses palpated. No hepatosplenomegaly. Bowel sounds positive.  Chronic indwelling Foley catheter Musculoskeletal: no clubbing / cyanosis. No joint deformity upper and lower extremities. Good ROM, no contractures. Normal muscle tone.  Skin: no rashes, lesions, ulcers. No induration Neurologic: No apparent cranial nerve abnormality, moving extremities spontaneously Psychiatric: Normal judgment and insight. Alert and oriented x 3. Normal mood.   Labs on Admission: I have personally reviewed following labs and imaging studies  CBC: Recent Labs  Lab 12/13/21 1521  WBC 11.6*  NEUTROABS 10.1*  HGB 13.4  HCT 41.3  MCV 81.0  PLT 379   Basic Metabolic Panel: Recent Labs  Lab 12/13/21 1521  NA 136  K 4.1  CL 100  CO2 29  GLUCOSE 126*  BUN 14  CREATININE 1.27*  CALCIUM 9.4   GFR: Estimated Creatinine Clearance: 68.7 mL/min (A) (by C-G formula based on SCr of 1.27 mg/dL (H)). Liver Function Tests: Recent Labs  Lab 12/13/21 1521  AST 15  ALT 15  ALKPHOS 91  BILITOT 0.6  PROT 7.1  ALBUMIN 3.8   Recent Labs  Lab 12/13/21 1521  LIPASE 28   Urine analysis:    Component Value Date/Time   COLORURINE BROWN (A) 10/16/2021 1116   APPEARANCEUR Hazy (A) 11/05/2021 1005    LABSPEC 1.015 10/16/2021 1116   PHURINE 7.0 10/16/2021 1116   GLUCOSEU Negative 11/05/2021 1005   HGBUR LARGE (A) 10/16/2021 1116   BILIRUBINUR Negative 11/05/2021 [place] 10/16/2021 1116   PROTEINUR 3+ (A) 11/05/2021 1005   PROTEINUR 100 (A) 10/16/2021 1116   UROBILINOGEN 0.2 09/27/2018 1131   NITRITE Positive (A) 11/05/2021 1005   NITRITE POSITIVE (A) 10/16/2021 1116   LEUKOCYTESUR 3+ (A) 11/05/2021 1005   LEUKOCYTESUR SMALL (A) 10/16/2021 1116    Radiological Exams on Admission: CT ABDOMEN PELVIS W CONTRAST  Result Date: 12/13/2021 CLINICAL  DATA:  Bowel obstruction suspected. Abdominal pain with nausea with onset last night. Patient has indwelling Foley catheter. EXAM: CT ABDOMEN AND PELVIS WITH CONTRAST TECHNIQUE: Multidetector CT imaging of the abdomen and pelvis was performed using the standard protocol following bolus administration of intravenous contrast. CONTRAST:  153mL OMNIPAQUE IOHEXOL 300 MG/ML  SOLN COMPARISON:  100 mL of Omnipaque 300 intravenous. FINDINGS: Lower chest: No acute abnormality. Hepatobiliary: No focal liver abnormality is seen. No gallstones, gallbladder wall thickening, or biliary dilatation. Pancreas: Dilatation of the main pancreatic duct within the pancreatic head neck and proximal body, measuring up to 0.5 cm in diameter (series 2, images 21, 24). There is diffuse mild parenchymal atrophy. No peripancreatic fat stranding or fluid collection. Spleen: Normal in size without focal abnormality. Adrenals/Urinary Tract: Adrenal glands are unremarkable. A 1.8 cm partially exophytic solid mass with attenuation of 48 Hounsfield units is noted at the upper pole of the right kidney, best appreciated on coronal and sagittal views (series 5, image 82; series 6, image 60). Multiple bilateral simple cortical renal cysts measuring up to 5.8 cm at the lower third of the left kidney (series 2, image 35). No hydronephrosis or nephrolithiasis. Minimally distended  urinary bladder with Foley catheter in place. Foley catheter balloon is at the level of the prostatic urethra. Stomach/Bowel: Small hiatal hernia. A 2.1 cm air and fluid-filled diverticulum is noted along the for segment of the duodenum (series 2, image 29). Multiple loops of dilated proximal small bowel measure up to 3.3 cm in diameter. In the mid left abdomen there is a segment of small bowel with small bowel feces sign and gradual tapering to normal caliber (series 2, images 44-55; series 5, image 56). No distinct transition point. There is a trace amount of interloop fluid along this segment of small bowel as well as mild mesenteric edema. The small bowel distal to this area of tapering is decompressed. The appendix appears normal. Vascular/Lymphatic: Aortic atherosclerosis. No enlarged abdominal or pelvic lymph nodes. Reproductive: Stable appearance of the markedly enlarged prostate gland with mass effect on the posterior aspect of the urinary bladder. Other: Trace amount of simple free fluid is noted in the lower abdomen and pelvis. No free intraperitoneal air. Musculoskeletal: Severe degenerative disc disease at the L2-L5 levels with vacuum disc phenomenon. No acute osseous abnormality or suspicious osseous lesion. IMPRESSION: 1. At least partial small bowel obstruction with gradual tapering of small bowel caliber in the mid left abdomen. No distinct transition point though the distal small bowel is decompressed. 2. Trace abdominopelvic ascites. 3. Foley catheter balloon is at the level of the prostatic urethra. Recommend repositioning. 4. Dilated main pancreatic duct within the pancreatic head, neck, and proximal body. When the patient is clinically stable and able to follow directions and hold their breath (preferably as an outpatient) further evaluation with dedicated abdominal MRI should be considered. 5. Partially exophytic 1.8 cm solid mass at the superior pole of the right kidney. This can also be  evaluated on abdominal MRI. 6. Prostatomegaly, unchanged. Electronically Signed   By: Ileana Roup M.D.   On: 12/13/2021 17:06    EKG: Independently reviewed.  Atrial fibrillation, rate 95.  QTc 461.  No significant change from prior.  Assessment/Plan Principal Problem:   SBO (small bowel obstruction) (HCC) Active Problems:   Gout, unspecified   Morbid obesity ([place])   Essential hypertension, benign   Atrial fibrillation (HCC)   Right kidney mass   Dilated pancreatic duct   Small Bowel obstruction-vomiting, last  bowel movement today.  CT abdomen and pelvis with contrast-shows at least partial small bowel obstruction, with gradual tapering of small bowel caliber in the mid left abdomen. No distinct transition point. -General surgery consult in the morning -N.p.o. - N/s 75cc/hr  -Hold NG tube for now just 1 episode of vomiting, no bloating and resolved pain  Atrial fibrillation-rate controlled- 70s - 80s.  CHA2DS2-VASc score at least 3. He tells me he is not on anticoagulation due to personal choice.  Not on rate limiting medications.  Prostate cancer history, chronic indwelling Foley catheter- follows with Dr. Jeffie Pollock.  Chronic Foley for urinary retention.  CT today recommending Foley be repositioned.  This was also an issue 11/05/2021 with his Foley and this was repositioned as it is urologist office. -Consider repositioning in a.m, so if he there is any complication his urologist can be called in.   Incidental findings dilated main pancreatic duct within the pancreatic head, neck,  and proximal body. When the patient is clinically stable and able to follow directions and hold their breath (preferably as an outpatient) further evaluation with dedicated abdominal MRI should be considered. Partially exophytic 1.8 cm solid mass at the superior pole of the right kidney. This can also be evaluated on abdominal MRI. -This will need to be followed up on discharge. -Problem list updated  DVT  prophylaxis: Heparin for now Code Status: Full code Family Communication: Spouse at bedside. Disposition Plan: ~ 1- 2 days Consults called: Gen surg Admission status: Obs med surg    Bethena Roys MD [facility]  12/13/2021, 9:28 PM

## 2021-12-13 NOTE — ED Provider Notes (Signed)
°  Face-to-face evaluation   History: He resents for evaluation of vomiting abdominal pain.  He was concerned about constipation.  He has not previously had abdominal surgery.  He was doing well yesterday, eating and stooling.  Today he had a small bowel movement and had some nausea.  He did not vomit until he arrived here.  He denies fever, chills, cough or shortness of breath.  Physical exam: Obese male.  He is elderly.  He is alert and cooperative.  He is lucid.  Abdomen is mildly distended.  Medical screening examination/treatment/procedure(s) were conducted as a shared visit with non-physician practitioner(s) and myself.  I personally evaluated the patient during the encounter    Daleen Bo, MD 12/13/21 2303

## 2021-12-14 ENCOUNTER — Observation Stay (HOSPITAL_COMMUNITY): Payer: Medicare HMO

## 2021-12-14 ENCOUNTER — Encounter (HOSPITAL_COMMUNITY): Payer: Self-pay | Admitting: Internal Medicine

## 2021-12-14 DIAGNOSIS — M1990 Unspecified osteoarthritis, unspecified site: Secondary | ICD-10-CM | POA: Diagnosis present

## 2021-12-14 DIAGNOSIS — M109 Gout, unspecified: Secondary | ICD-10-CM | POA: Diagnosis present

## 2021-12-14 DIAGNOSIS — T83021A Displacement of indwelling urethral catheter, initial encounter: Secondary | ICD-10-CM | POA: Diagnosis present

## 2021-12-14 DIAGNOSIS — K5730 Diverticulosis of large intestine without perforation or abscess without bleeding: Secondary | ICD-10-CM | POA: Diagnosis present

## 2021-12-14 DIAGNOSIS — K8689 Other specified diseases of pancreas: Secondary | ICD-10-CM | POA: Diagnosis not present

## 2021-12-14 DIAGNOSIS — Z79899 Other long term (current) drug therapy: Secondary | ICD-10-CM | POA: Diagnosis not present

## 2021-12-14 DIAGNOSIS — K56609 Unspecified intestinal obstruction, unspecified as to partial versus complete obstruction: Secondary | ICD-10-CM | POA: Diagnosis not present

## 2021-12-14 DIAGNOSIS — I10 Essential (primary) hypertension: Secondary | ICD-10-CM | POA: Diagnosis not present

## 2021-12-14 DIAGNOSIS — E785 Hyperlipidemia, unspecified: Secondary | ICD-10-CM | POA: Diagnosis present

## 2021-12-14 DIAGNOSIS — I4891 Unspecified atrial fibrillation: Secondary | ICD-10-CM | POA: Diagnosis not present

## 2021-12-14 DIAGNOSIS — Z8249 Family history of ischemic heart disease and other diseases of the circulatory system: Secondary | ICD-10-CM | POA: Diagnosis not present

## 2021-12-14 DIAGNOSIS — N183 Chronic kidney disease, stage 3 unspecified: Secondary | ICD-10-CM

## 2021-12-14 DIAGNOSIS — I129 Hypertensive chronic kidney disease with stage 1 through stage 4 chronic kidney disease, or unspecified chronic kidney disease: Secondary | ICD-10-CM | POA: Diagnosis present

## 2021-12-14 DIAGNOSIS — Z888 Allergy status to other drugs, medicaments and biological substances status: Secondary | ICD-10-CM | POA: Diagnosis not present

## 2021-12-14 DIAGNOSIS — N2889 Other specified disorders of kidney and ureter: Secondary | ICD-10-CM | POA: Diagnosis present

## 2021-12-14 DIAGNOSIS — Y738 Miscellaneous gastroenterology and urology devices associated with adverse incidents, not elsewhere classified: Secondary | ICD-10-CM | POA: Diagnosis present

## 2021-12-14 DIAGNOSIS — Z6834 Body mass index (BMI) 34.0-34.9, adult: Secondary | ICD-10-CM | POA: Diagnosis not present

## 2021-12-14 DIAGNOSIS — Z833 Family history of diabetes mellitus: Secondary | ICD-10-CM | POA: Diagnosis not present

## 2021-12-14 DIAGNOSIS — K56600 Partial intestinal obstruction, unspecified as to cause: Secondary | ICD-10-CM | POA: Diagnosis present

## 2021-12-14 DIAGNOSIS — Z87891 Personal history of nicotine dependence: Secondary | ICD-10-CM | POA: Diagnosis not present

## 2021-12-14 DIAGNOSIS — Z8546 Personal history of malignant neoplasm of prostate: Secondary | ICD-10-CM | POA: Diagnosis not present

## 2021-12-14 DIAGNOSIS — Z20822 Contact with and (suspected) exposure to covid-19: Secondary | ICD-10-CM | POA: Diagnosis present

## 2021-12-14 DIAGNOSIS — N1831 Chronic kidney disease, stage 3a: Secondary | ICD-10-CM | POA: Diagnosis not present

## 2021-12-14 DIAGNOSIS — N401 Enlarged prostate with lower urinary tract symptoms: Secondary | ICD-10-CM | POA: Diagnosis present

## 2021-12-14 DIAGNOSIS — E1122 Type 2 diabetes mellitus with diabetic chronic kidney disease: Secondary | ICD-10-CM | POA: Diagnosis present

## 2021-12-14 DIAGNOSIS — R338 Other retention of urine: Secondary | ICD-10-CM | POA: Diagnosis present

## 2021-12-14 LAB — CBC
HCT: 40.5 % (ref 39.0–52.0)
Hemoglobin: 13.2 g/dL (ref 13.0–17.0)
MCH: 26.4 pg (ref 26.0–34.0)
MCHC: 32.6 g/dL (ref 30.0–36.0)
MCV: 81 fL (ref 80.0–100.0)
Platelets: 203 10*3/uL (ref 150–400)
RBC: 5 MIL/uL (ref 4.22–5.81)
RDW: 15.7 % — ABNORMAL HIGH (ref 11.5–15.5)
WBC: 11.7 10*3/uL — ABNORMAL HIGH (ref 4.0–10.5)
nRBC: 0 % (ref 0.0–0.2)

## 2021-12-14 LAB — BASIC METABOLIC PANEL
Anion gap: 10 (ref 5–15)
BUN: 15 mg/dL (ref 8–23)
CO2: 26 mmol/L (ref 22–32)
Calcium: 9.5 mg/dL (ref 8.9–10.3)
Chloride: 101 mmol/L (ref 98–111)
Creatinine, Ser: 1.17 mg/dL (ref 0.61–1.24)
GFR, Estimated: >60 mL/min (ref >60)
Glucose, Bld: 144 mg/dL — ABNORMAL HIGH (ref 70–99)
Potassium: 4.1 mmol/L (ref 3.5–5.1)
Sodium: 137 mmol/L (ref 135–145)

## 2021-12-14 LAB — ECHOCARDIOGRAM COMPLETE
AR max vel: 2.71 cm2
AV Area VTI: 2.69 cm2
AV Area mean vel: 2.41 cm2
AV Mean grad: 4 mmHg
AV Peak grad: 6.5 mmHg
Ao pk vel: 1.27 m/s
Area-P 1/2: 5.09 cm2
Height: 73 in
MV VTI: 2.97 cm2
S' Lateral: 3.9 cm
Weight: 4190.5 oz

## 2021-12-14 LAB — GLUCOSE, CAPILLARY
Glucose-Capillary: 166 mg/dL — ABNORMAL HIGH (ref 70–99)
Glucose-Capillary: 145 mg/dL — ABNORMAL HIGH (ref 70–99)
Glucose-Capillary: 94 mg/dL (ref 70–99)
Glucose-Capillary: 79 mg/dL (ref 70–99)

## 2021-12-14 MED ORDER — MORPHINE SULFATE (PF) 2 MG/ML IV SOLN
2.0000 mg | Freq: Once | INTRAVENOUS | Status: AC
  Administered 2021-12-14: 05:00:00 2 mg via INTRAVENOUS

## 2021-12-14 MED ORDER — DEXTROSE-NACL 5-0.9 % IV SOLN: INTRAVENOUS | Status: DC

## 2021-12-14 MED ORDER — BISACODYL 10 MG RE SUPP
10.0000 mg | Freq: Once | RECTAL | Status: AC
  Administered 2021-12-14: 18:00:00 10 mg via RECTAL
  Filled 2021-12-14: qty 1

## 2021-12-14 MED ORDER — POLYETHYLENE GLYCOL 3350 17 G PO PACK
17.0000 g | PACK | Freq: Two times a day (BID) | ORAL | Status: DC
  Administered 2021-12-14 – 2021-12-15 (×2): 17 g via ORAL
  Filled 2021-12-14 (×2): qty 1

## 2021-12-14 MED ORDER — CHLORHEXIDINE GLUCONATE CLOTH 2 % EX PADS
6.0000 | MEDICATED_PAD | Freq: Every day | CUTANEOUS | Status: DC
  Administered 2021-12-14 – 2021-12-15 (×2): 6 via TOPICAL

## 2021-12-14 NOTE — Consult Note (Signed)
Reason for Consult: Partial small bowel obstruction Referring Physician: [name] is an 75 y.o. male.  HPI: Patient is a 75 year old black male with multiple medical problems including atrial fibrillation who presented to the emergency room with the complaint of worsening abdominal pain, abdominal distention, nausea that started earlier yesterday.  He states he was in his usual state of health.  He denies any significant history of needing admission for bowel issues in the past.  He was found on CT scan of the abdomen and had multiple issues including a dilated pancreatic duct, partial small bowel obstruction with decompressed distal small bowel, and a small fungating mass on the right kidney.  He was admitted by the hospitalist for further evaluation and treatment.  An NG tube was not placed at that time.  He has a colonoscopy from 2021 which was for the most part unremarkable.  He states this morning, he does feel somewhat better.  He is less distended.  He has not been passing much gas or flatus.  His last bowel movement was yesterday morning.  He is not nauseated. Past Medical History:  Diagnosis Date   Arthritis    Atrial fibrillation Carlos Leon)    Present by ECG 01/2012   Colon polyps    Diverticulosis of colon    Erectile dysfunction    Essential hypertension    Gout    Hyperlipidemia    Metabolic syndrome X 5/0/2774   Obesity     Past Surgical History:  Procedure Laterality Date   Colon polyps removed  2002   Dr. Tamala Julian   COLONOSCOPY  2004   Dr. Misty Stanley prep, ACBE showed diverticulosis   COLONOSCOPY  11/24/2011   Procedure: COLONOSCOPY;  Surgeon: Daneil Dolin, MD;  [facility];  Service: Endoscopy;  Laterality: N/A;  9:10   COLONOSCOPY N/A 03/26/2020   Procedure: COLONOSCOPY;  Surgeon: Daneil Dolin, MD;  Location: [facility];  Service: Endoscopy;  Laterality: N/A;  9:30   HERNIA REPAIR     PROSTATE BIOPSY      Family History  Problem  Relation Age of Onset   Hypertension Mother    Cancer Mother        Rectal, approximately 17   Diabetes Father    Alzheimer's disease Father    Liver disease Neg Hx    Colon cancer Neg Hx     Social History:  reports that he has quit smoking. His smoking use included cigarettes. He has a 10.00 pack-year smoking history. He has never used smokeless tobacco. He reports that he does not drink alcohol and does not use drugs.  Allergies:  Allergies  Allergen Reactions   Chlorthalidone Rash    Medications: I have reviewed the patient's current medications.  Results for orders placed or performed during the hospital encounter of 12/13/21 (from the past 48 hour(s))  CBC with Differential/Platelet     Status: Abnormal   Collection Time: 12/13/21  3:21 PM  Result Value Ref Range   WBC 11.6 (H) 4.0 - 10.5 K/uL   RBC 5.10 4.22 - 5.81 MIL/uL   Hemoglobin 13.4 13.0 - 17.0 g/dL   HCT 41.3 39.0 - 52.0 %   MCV 81.0 80.0 - 100.0 fL   MCH 26.3 26.0 - 34.0 pg   MCHC 32.4 30.0 - 36.0 g/dL   RDW 15.7 (H) 11.5 - 15.5 %   Platelets 216 150 - 400 K/uL   nRBC 0.0 0.0 - 0.2 %  Neutrophils Relative % 87 %   Neutro Abs 10.1 (H) 1.7 - 7.7 K/uL   Lymphocytes Relative 6 %   Lymphs Abs 0.7 0.7 - 4.0 K/uL   Monocytes Relative 4 %   Monocytes Absolute 0.4 0.1 - 1.0 K/uL   Eosinophils Relative 2 %   Eosinophils Absolute 0.3 0.0 - 0.5 K/uL   Basophils Relative 0 %   Basophils Absolute 0.0 0.0 - 0.1 K/uL   Immature Granulocytes 1 %   Abs Immature Granulocytes 0.06 0.00 - 0.07 K/uL    Comment: Performed at [facility], [address], [place], [place] [address]  Comprehensive metabolic panel     Status: Abnormal   Collection Time: 12/13/21  3:21 PM  Result Value Ref Range   Sodium 136 135 - 145 mmol/L   Potassium 4.1 3.5 - 5.1 mmol/L   Chloride 100 98 - 111 mmol/L   CO2 29 22 - 32 mmol/L   Glucose, Bld 126 (H) 70 - 99 mg/dL    Comment: Glucose reference range applies only to samples taken after  fasting for at least 8 hours.   BUN 14 8 - 23 mg/dL   Creatinine, Ser 1.27 (H) 0.61 - 1.24 mg/dL   Calcium 9.4 8.9 - 10.3 mg/dL   Total Protein 7.1 6.5 - 8.1 g/dL   Albumin 3.8 3.5 - 5.0 g/dL   AST 15 15 - 41 U/L   ALT 15 0 - 44 U/L   Alkaline Phosphatase 91 38 - 126 U/L   Total Bilirubin 0.6 0.3 - 1.2 mg/dL   GFR, Estimated 59 (L) >60 mL/min    Comment: (NOTE) Calculated using the CKD-EPI Creatinine Equation (2021)    Anion gap 7 5 - 15    Comment: Performed at [facility], [address], [place], [place] [address]  Lipase, blood     Status: None   Collection Time: 12/13/21  3:21 PM  Result Value Ref Range   Lipase 28 11 - 51 U/L    Comment: Performed at [facility], [address], [place],  [address]  Resp Panel by RT-PCR (Flu A&B, Covid) Nasopharyngeal Swab     Status: None   Collection Time: 12/13/21  6:19 PM   Specimen: Nasopharyngeal Swab; Nasopharyngeal(NP) swabs in vial transport medium  Result Value Ref Range   SARS Coronavirus 2 by RT PCR NEGATIVE NEGATIVE    Comment: (NOTE) SARS-CoV-2 target nucleic acids are NOT DETECTED.  The SARS-CoV-2 RNA is generally detectable in upper respiratory specimens during the acute phase of infection. The lowest concentration of SARS-CoV-2 viral copies this assay can detect is 138 copies/mL. A negative result does not preclude SARS-Cov-2 infection and should not be used as the sole basis for treatment or other patient management decisions. A negative result may occur with  improper specimen collection/handling, submission of specimen other than nasopharyngeal swab, presence of viral mutation(s) within the areas targeted by this assay, and inadequate number of viral copies(<138 copies/mL). A negative result must be combined with clinical observations, patient history, and epidemiological information. The expected result is Negative.  Fact Sheet for Patients:  EntrepreneurPulse.com.au  Fact Sheet for  Healthcare Providers:  IncredibleEmployment.be  This test is no t yet approved or cleared by the [place] FDA and  has been authorized for detection and/or diagnosis of SARS-CoV-2 by FDA under an Emergency Use Authorization (EUA). This EUA will remain  in effect (meaning this test can be used) for the duration of the COVID-19 declaration under Section  564(b)(1) of the Act, 21 U.S.C.section 360bbb-3(b)(1), unless the authorization is terminated  or revoked sooner.       Influenza A by PCR NEGATIVE NEGATIVE   Influenza B by PCR NEGATIVE NEGATIVE    Comment: (NOTE) The Xpert Xpress SARS-CoV-2/FLU/RSV plus assay is intended as an aid in the diagnosis of influenza from Nasopharyngeal swab specimens and should not be used as a sole basis for treatment. Nasal washings and aspirates are unacceptable for Xpert Xpress SARS-CoV-2/FLU/RSV testing.  Fact Sheet for Patients: EntrepreneurPulse.com.au  Fact Sheet for Healthcare Providers: IncredibleEmployment.be  This test is not yet approved or cleared by the [place] FDA and has been authorized for detection and/or diagnosis of SARS-CoV-2 by FDA under an Emergency Use Authorization (EUA). This EUA will remain in effect (meaning this test can be used) for the duration of the COVID-19 declaration under Section 564(b)(1) of the Act, 21 U.S.C. section 360bbb-3(b)(1), unless the authorization is terminated or revoked.  Performed at [facility], [address] [place], [place] [address]   Basic metabolic panel     Status: Abnormal   Collection Time: 12/14/21  5:15 AM  Result Value Ref Range   Sodium 137 135 - 145 mmol/L   Potassium 4.1 3.5 - 5.1 mmol/L   Chloride 101 98 - 111 mmol/L   CO2 26 22 - 32 mmol/L   Glucose, Bld 144 (H) 70 - 99 mg/dL    Comment: Glucose reference range applies only to samples taken after fasting for at least 8 hours.   BUN 15 8 - 23 mg/dL    Creatinine, Ser 1.17 0.61 - 1.24 mg/dL   Calcium 9.5 8.9 - 10.3 mg/dL   GFR, Estimated >60 >60 mL/min    Comment: (NOTE) Calculated using the CKD-EPI Creatinine Equation (2021)    Anion gap 10 5 - 15    Comment: Performed at [facility], [address], [place], [place] [address]  CBC     Status: Abnormal   Collection Time: 12/14/21  5:15 AM  Result Value Ref Range   WBC 11.7 (H) 4.0 - 10.5 K/uL   RBC 5.00 4.22 - 5.81 MIL/uL   Hemoglobin 13.2 13.0 - 17.0 g/dL   HCT 40.5 39.0 - 52.0 %   MCV 81.0 80.0 - 100.0 fL   MCH 26.4 26.0 - 34.0 pg   MCHC 32.6 30.0 - 36.0 g/dL   RDW 15.7 (H) 11.5 - 15.5 %   Platelets 203 150 - 400 K/uL   nRBC 0.0 0.0 - 0.2 %    Comment: Performed at [facility], [address], [place], [place] [address]  Glucose, capillary     Status: Abnormal   Collection Time: 12/14/21  7:07 AM  Result Value Ref Range   Glucose-Capillary 166 (H) 70 - 99 mg/dL    Comment: Glucose reference range applies only to samples taken after fasting for at least 8 hours.  Glucose, capillary     Status: Abnormal   Collection Time: 12/14/21 11:03 AM  Result Value Ref Range   Glucose-Capillary 145 (H) 70 - 99 mg/dL    Comment: Glucose reference range applies only to samples taken after fasting for at least 8 hours.  Glucose, capillary     Status: None   Collection Time: 12/14/21  4:01 PM  Result Value Ref Range   Glucose-Capillary 94 70 - 99 mg/dL    Comment: Glucose reference range applies only to samples taken after fasting for at least 8 hours.    CT  ABDOMEN PELVIS W CONTRAST  Result Date: 12/13/2021 CLINICAL DATA:  Bowel obstruction suspected. Abdominal pain with nausea with onset last night. Patient has indwelling Foley catheter. EXAM: CT ABDOMEN AND PELVIS WITH CONTRAST TECHNIQUE: Multidetector CT imaging of the abdomen and pelvis was performed using the standard protocol following bolus administration of intravenous contrast. CONTRAST:  135mL OMNIPAQUE IOHEXOL 300 MG/ML  SOLN  COMPARISON:  100 mL of Omnipaque 300 intravenous. FINDINGS: Lower chest: No acute abnormality. Hepatobiliary: No focal liver abnormality is seen. No gallstones, gallbladder wall thickening, or biliary dilatation. Pancreas: Dilatation of the main pancreatic duct within the pancreatic head neck and proximal body, measuring up to 0.5 cm in diameter (series 2, images 21, 24). There is diffuse mild parenchymal atrophy. No peripancreatic fat stranding or fluid collection. Spleen: Normal in size without focal abnormality. Adrenals/Urinary Tract: Adrenal glands are unremarkable. A 1.8 cm partially exophytic solid mass with attenuation of 48 Hounsfield units is noted at the upper pole of the right kidney, best appreciated on coronal and sagittal views (series 5, image 82; series 6, image 60). Multiple bilateral simple cortical renal cysts measuring up to 5.8 cm at the lower third of the left kidney (series 2, image 35). No hydronephrosis or nephrolithiasis. Minimally distended urinary bladder with Foley catheter in place. Foley catheter balloon is at the level of the prostatic urethra. Stomach/Bowel: Small hiatal hernia. A 2.1 cm air and fluid-filled diverticulum is noted along the for segment of the duodenum (series 2, image 29). Multiple loops of dilated proximal small bowel measure up to 3.3 cm in diameter. In the mid left abdomen there is a segment of small bowel with small bowel feces sign and gradual tapering to normal caliber (series 2, images 44-55; series 5, image 56). No distinct transition point. There is a trace amount of interloop fluid along this segment of small bowel as well as mild mesenteric edema. The small bowel distal to this area of tapering is decompressed. The appendix appears normal. Vascular/Lymphatic: Aortic atherosclerosis. No enlarged abdominal or pelvic lymph nodes. Reproductive: Stable appearance of the markedly enlarged prostate gland with mass effect on the posterior aspect of the urinary  bladder. Other: Trace amount of simple free fluid is noted in the lower abdomen and pelvis. No free intraperitoneal air. Musculoskeletal: Severe degenerative disc disease at the L2-L5 levels with vacuum disc phenomenon. No acute osseous abnormality or suspicious osseous lesion. IMPRESSION: 1. At least partial small bowel obstruction with gradual tapering of small bowel caliber in the mid left abdomen. No distinct transition point though the distal small bowel is decompressed. 2. Trace abdominopelvic ascites. 3. Foley catheter balloon is at the level of the prostatic urethra. Recommend repositioning. 4. Dilated main pancreatic duct within the pancreatic head, neck, and proximal body. When the patient is clinically stable and able to follow directions and hold their breath (preferably as an outpatient) further evaluation with dedicated abdominal MRI should be considered. 5. Partially exophytic 1.8 cm solid mass at the superior pole of the right kidney. This can also be evaluated on abdominal MRI. 6. Prostatomegaly, unchanged. Electronically Signed   By: Ileana Roup M.D.   On: 12/13/2021 17:06   ECHOCARDIOGRAM COMPLETE  Result Date: 12/14/2021    ECHOCARDIOGRAM REPORT   Patient Name:   Carlos Leon Date of Exam: 12/14/2021 Medical Rec #:  [id number]       Height:       73.0 in Accession #:    [id number]      Weight:  261.9 lb Date of Birth:  1946-02-09       BSA:          2.413 m Patient Age:    68 years        BP:           127/86 mmHg Patient Gender: M               HR:           89 bpm. Exam Location:  [facility] Procedure: 2D Echo, Cardiac Doppler and Color Doppler Indications:    Atrial Fibrillation  History:        Patient has prior history of Echocardiogram examinations, most                 recent 08/14/2013. Arrythmias:Atrial Fibrillation; Risk                 Factors:Hypertension and Dyslipidemia.  Sonographer:    Wenda Low Referring Phys: [phone] DAVID TAT IMPRESSIONS  1. Left ventricular  ejection fraction, by estimation, is 55 to 60%. The left ventricle has normal function. The left ventricle has no regional wall motion abnormalities. Left ventricular diastolic parameters are indeterminate.  2. Right ventricular systolic function is normal. The right ventricular size is normal. There is mildly elevated pulmonary artery systolic pressure.  3. Left atrial size was severely dilated.  4. Right atrial size was severely dilated.  5. The mitral valve is degenerative. Mild mitral valve regurgitation. No evidence of mitral stenosis. Moderate mitral annular calcification.  6. Tricuspid valve regurgitation is moderate.  7. The aortic valve is tricuspid. There is mild calcification of the aortic valve. Aortic valve regurgitation is trivial. Aortic valve sclerosis/calcification is present, without any evidence of aortic stenosis.  8. The inferior vena cava is normal in size with greater than 50% respiratory variability, suggesting right atrial pressure of 3 mmHg. FINDINGS  Left Ventricle: Left ventricular ejection fraction, by estimation, is 55 to 60%. The left ventricle has normal function. The left ventricle has no regional wall motion abnormalities. The left ventricular internal cavity size was normal in size. There is  no left ventricular hypertrophy. Left ventricular diastolic parameters are indeterminate. Right Ventricle: The right ventricular size is normal. No increase in right ventricular wall thickness. Right ventricular systolic function is normal. There is mildly elevated pulmonary artery systolic pressure. The tricuspid regurgitant velocity is 2.93  m/s, and with an assumed right atrial pressure of 8 mmHg, the estimated right ventricular systolic pressure is 75.6 mmHg. Left Atrium: Left atrial size was severely dilated. Right Atrium: Right atrial size was severely dilated. Pericardium: There is no evidence of pericardial effusion. Mitral Valve: The mitral valve is degenerative in appearance. There  is moderate thickening of the mitral valve leaflet(s). There is moderate calcification of the mitral valve leaflet(s). Moderate mitral annular calcification. Mild mitral valve regurgitation. No evidence of mitral valve stenosis. MV peak gradient, 5.3 mmHg. The mean mitral valve gradient is 1.0 mmHg. Tricuspid Valve: The tricuspid valve is normal in structure. Tricuspid valve regurgitation is moderate . No evidence of tricuspid stenosis. Aortic Valve: The aortic valve is tricuspid. There is mild calcification of the aortic valve. Aortic valve regurgitation is trivial. Aortic valve sclerosis/calcification is present, without any evidence of aortic stenosis. Aortic valve mean gradient measures 4.0 mmHg. Aortic valve peak gradient measures 6.5 mmHg. Aortic valve area, by VTI measures 2.69 cm. Pulmonic Valve: The pulmonic valve was normal in structure. Pulmonic valve regurgitation is mild. No evidence  of pulmonic stenosis. Aorta: The aortic root is normal in size and structure. Venous: The inferior vena cava is normal in size with greater than 50% respiratory variability, suggesting right atrial pressure of 3 mmHg. IAS/Shunts: No atrial level shunt detected by color flow Doppler.  LEFT VENTRICLE PLAX 2D LVIDd:         5.20 cm   Diastology LVIDs:         3.90 cm   LV e' medial:    11.30 cm/s LV PW:         1.30 cm   LV E/e' medial:  8.2 LV IVS:        1.10 cm   LV e' lateral:   14.90 cm/s LVOT diam:     2.20 cm   LV E/e' lateral: 6.2 LV SV:         72 LV SV Index:   30 LVOT Area:     3.80 cm  RIGHT VENTRICLE RV Basal diam:  4.00 cm RV Mid diam:    2.70 cm LEFT ATRIUM              Index        RIGHT ATRIUM           Index LA diam:        5.90 cm  2.45 cm/m   RA Area:     30.50 cm LA Vol (A2C):   140.0 ml 58.02 ml/m  RA Volume:   103.00 ml 42.69 ml/m LA Vol (A4C):   122.0 ml 50.56 ml/m LA Biplane Vol: 132.0 ml 54.71 ml/m  AORTIC VALVE                    PULMONIC VALVE AV Area (Vmax):    2.71 cm     PV Vmax:        0.91 m/s AV Area (Vmean):   2.41 cm     PV Peak grad:  3.3 mmHg AV Area (VTI):     2.69 cm AV Vmax:           127.00 cm/s AV Vmean:          89.300 cm/s AV VTI:            0.267 m AV Peak Grad:      6.5 mmHg AV Mean Grad:      4.0 mmHg LVOT Vmax:         90.70 cm/s LVOT Vmean:        56.500 cm/s LVOT VTI:          0.189 m LVOT/AV VTI ratio: 0.71  AORTA Ao Root diam: 3.60 cm Ao Asc diam:  3.60 cm MITRAL VALVE               TRICUSPID VALVE MV Area (PHT): 5.09 cm    TR Peak grad:   34.3 mmHg MV Area VTI:   2.97 cm    TR Vmax:        293.00 cm/s MV Peak grad:  5.3 mmHg MV Mean grad:  1.0 mmHg    SHUNTS MV Vmax:       1.15 m/s    Systemic VTI:  0.19 m MV Vmean:      48.9 cm/s   Systemic Diam: 2.20 cm MV Decel Time: 149 msec MV E velocity: 92.50 cm/s Kahlen Boyde Rouge MD Electronically signed by Wasif Simonich Rouge MD Signature Date/Time: 12/14/2021/11:52:03 AM    Final     ROS:  Pertinent items  are noted in HPI.  Blood pressure 133/81, pulse 81, temperature 98.4 F (36.9 C), temperature source Oral, resp. rate 20, height 6\' 1"  (1.854 m), weight 118.8 kg, SpO2 98 %. Physical Exam: Pleasant black male no acute distress Head is normocephalic, atraumatic Lungs clear to auscultation with your breath sounds bilaterally Heart examination reveals an irregular rate and rhythm. Abdomen is only slightly distended but it is soft.  Minimal bowel sounds appreciated.  No hernias appreciated.  CT scan reviewed  Assessment/Plan: Impression: Partial small bowel obstruction.  The etiology is unknown. Plan: No need for acute surgical invention at this time.  As patient is not nauseated or significantly distended, will hold off on an NG tube at the present time.  We will give cathartics.  We will follow with you.  Aviva Signs 12/14/2021, 5:15 PM

## 2021-12-14 NOTE — Progress Notes (Signed)
°  Transition of Care [facility]) Screening Note   Patient Details  Name: Carlos Leon Date of Birth: March 05, 1946   Transition of Care [facility]) CM/SW Contact:    Shade Flood, LCSW Phone Number: 12/14/2021, 10:56 AM    Transition of Care Department [facility]) has reviewed patient and no TOC needs have been identified at this time. Will continue to monitor patient advancement through interdisciplinary progression rounds. If new patient transition needs arise, please place a TOC consult.

## 2021-12-14 NOTE — Progress Notes (Signed)
PROGRESS NOTE  Carlos Leon YOV:[id number] DOB: October 24, 1946 DOA: 12/13/2021 PCP: [name], MD  Brief History:  75 year old male with a history of prostate cancer and BPH with indwelling Foley catheter, atrial fibrillation, hyperlipidemia, and gouty arthritis presenting with 1 day history of abdominal pain that began on the evening of 12/12/2021.  He stated that the abdominal pain is mostly upper abdomen and periumbilical that was constant and sharp and severe.  He had an episode of nausea and vomiting.  As result he presented for further evaluation.  Denied any fevers, chills, chest pain, shortness of breath, coughing, hemoptysis.  His last bowel movement was 12/13/2021.  Currently he is not passing any flatus.  He denies any new medications. He quit smoking 30 years ago after approximately 40-pack-year history.  He denies any alcohol or illicit drug use. In the emergency department, the patient was afebrile hemodynamically stable with oxygen saturation 100% room air.  BMP shows sodium 136, potassium 4.1, bicarbonate 29, serum creatinine 1.27.  LFTs were unremarkable.  Lipase 28.  WBC 11.6, hemoglobin 13.4, platelets 216,000.  CT of the abdomen and pelvis showed a partial small bowel obstruction with gradual tapering of the small bowel caliber in the mid left abdomen.  Assessment/Plan: Partial SBO -remain npo -general surgery consult -may need NG decompression -Continue IV fluids  Malpositioned Foley catheter -CT abdomen pelvis showed that the Foley balloon is in the prostatic urethra -Request repositioning  Dilated pancreatic duct -LFTs unremarkable -Normal total bilirubin -GI consult  Atrial fibrillation, type unspecified -Patient has never been on anticoagulation -CHADS-VASc =5 (HTN, Age, DM, ASVD) -I discussed risks/benefits/alternatives of AC -he is amenable to starting [facility] once he has recovered from his acute illness -echo  CKD stage IIIa -Baseline  creatinine 1.1-1.4  Diabetes mellitus type 2 -07/06/2021 Hemoglobin A1c 6.5 -Recheck A1c -CBGs controlled  Essential hypertension -Holding amlodipine and benazepril secondary to n.p.o. -BP remains well controlled  Class I obesity -BMI of 34.55 -Lifestyle modification     Status is: Observation  The patient will require care spanning > 2 midnights and should be moved to inpatient because: continue SBO       Family Communication:  no  Family at bedside  Consultants:  general surgery  Code Status:  FULL  DVT Prophylaxis:  [place] Heparin   Procedures: As Listed in Progress Note Above  Antibiotics: None       Subjective: Patient states that his abdominal pain is not better than yesterday, but it is not worse.  He has not had any emesis but feels nauseous.  He denies any headache, fevers, chills, dysuria, hematuria.  He has not passed any flatus nor has he had a bowel movement.  Objective: Vitals:   12/13/21 2050 12/14/21 0209 12/14/21 0320 12/14/21 0606  BP:  130/83  127/86  Pulse:  (!) 102 76 72  Resp:  18  18  Temp:  98.1 F (36.7 C)  97.9 F (36.6 C)  TempSrc:      SpO2: 100% 98%  97%  Weight:      Height:        Intake/Output Summary (Last 24 hours) at 12/14/2021 0728 Last data filed at 12/14/2021 0329 Gross per 24 hour  Intake 426.41 ml  Output 950 ml  Net -523.59 ml   Weight change:  Exam:  General:  Pt is alert, follows commands appropriately, not in acute distress HEENT: No icterus, No thrush, No neck mass, [place]/AT  Cardiovascular: IRRR, S1/S2, no rubs, no gallops Respiratory: CTA bilaterally, no wheezing, no crackles, no rhonchi Abdomen: Soft/+BS, upper tender, non distended, no guarding Extremities: No edema, No lymphangitis, No petechiae, No rashes, no synovitis   Data Reviewed: I have personally reviewed following labs and imaging studies Basic Metabolic Panel: Recent Labs  Lab 12/13/21 1521 12/14/21 0515  NA 136 137  K 4.1 4.1   CL 100 101  CO2 29 26  GLUCOSE 126* 144*  BUN 14 15  CREATININE 1.27* 1.17  CALCIUM 9.4 9.5   Liver Function Tests: Recent Labs  Lab 12/13/21 1521  AST 15  ALT 15  ALKPHOS 91  BILITOT 0.6  PROT 7.1  ALBUMIN 3.8   Recent Labs  Lab 12/13/21 1521  LIPASE 28   No results for input(s): AMMONIA in the last 168 hours. Coagulation Profile: No results for input(s): INR, PROTIME in the last 168 hours. CBC: Recent Labs  Lab 12/13/21 1521 12/14/21 0515  WBC 11.6* 11.7*  NEUTROABS 10.1*  --   HGB 13.4 13.2  HCT 41.3 40.5  MCV 81.0 81.0  PLT 216 203   Cardiac Enzymes: No results for input(s): CKTOTAL, CKMB, CKMBINDEX, TROPONINI in the last 168 hours. BNP: Invalid input(s): POCBNP CBG: Recent Labs  Lab 12/14/21 0707  GLUCAP 166*   HbA1C: No results for input(s): HGBA1C in the last 72 hours. Urine analysis:    Component Value Date/Time   COLORURINE BROWN (A) 10/16/2021 1116   APPEARANCEUR Hazy (A) 11/05/2021 1005   LABSPEC 1.015 10/16/2021 1116   PHURINE 7.0 10/16/2021 1116   GLUCOSEU Negative 11/05/2021 1005   HGBUR LARGE (A) 10/16/2021 1116   BILIRUBINUR Negative 11/05/2021 1005   KETONESUR NEGATIVE 10/16/2021 1116   PROTEINUR 3+ (A) 11/05/2021 1005   PROTEINUR 100 (A) 10/16/2021 1116   UROBILINOGEN 0.2 09/27/2018 1131   NITRITE Positive (A) 11/05/2021 1005   NITRITE POSITIVE (A) 10/16/2021 1116   LEUKOCYTESUR 3+ (A) 11/05/2021 1005   LEUKOCYTESUR SMALL (A) 10/16/2021 1116   Sepsis Labs: @LABRCNTIP (procalcitonin:4,lacticidven:4) ) Recent Results (from the past 240 hour(s))  Resp Panel by RT-PCR (Flu A&B, Covid) Nasopharyngeal Swab     Status: None   Collection Time: 12/13/21  6:19 PM   Specimen: Nasopharyngeal Swab; Nasopharyngeal(NP) swabs in vial transport medium  Result Value Ref Range Status   SARS Coronavirus 2 by RT PCR NEGATIVE NEGATIVE Final    Comment: (NOTE) SARS-CoV-2 target nucleic acids are NOT DETECTED.  The SARS-CoV-2 RNA is  generally detectable in upper respiratory specimens during the acute phase of infection. The lowest concentration of SARS-CoV-2 viral copies this assay can detect is 138 copies/mL. A negative result does not preclude SARS-Cov-2 infection and should not be used as the sole basis for treatment or other patient management decisions. A negative result may occur with  improper specimen collection/handling, submission of specimen other than nasopharyngeal swab, presence of viral mutation(s) within the areas targeted by this assay, and inadequate number of viral copies(<138 copies/mL). A negative result must be combined with clinical observations, patient history, and epidemiological information. The expected result is Negative.  Fact Sheet for Patients:  EntrepreneurPulse.com.au  Fact Sheet for Healthcare Providers:  IncredibleEmployment.be  This test is no t yet approved or cleared by the [place] FDA and  has been authorized for detection and/or diagnosis of SARS-CoV-2 by FDA under an Emergency Use Authorization (EUA). This EUA will remain  in effect (meaning this test can be used) for the duration of the COVID-19 declaration under  Section 564(b)(1) of the Act, 21 U.S.C.section 360bbb-3(b)(1), unless the authorization is terminated  or revoked sooner.       Influenza A by PCR NEGATIVE NEGATIVE Final   Influenza B by PCR NEGATIVE NEGATIVE Final    Comment: (NOTE) The Xpert Xpress SARS-CoV-2/FLU/RSV plus assay is intended as an aid in the diagnosis of influenza from Nasopharyngeal swab specimens and should not be used as a sole basis for treatment. Nasal washings and aspirates are unacceptable for Xpert Xpress SARS-CoV-2/FLU/RSV testing.  Fact Sheet for Patients: EntrepreneurPulse.com.au  Fact Sheet for Healthcare Providers: IncredibleEmployment.be  This test is not yet approved or cleared by the [place] FDA and has been authorized for detection and/or diagnosis of SARS-CoV-2 by FDA under an Emergency Use Authorization (EUA). This EUA will remain in effect (meaning this test can be used) for the duration of the COVID-19 declaration under Section 564(b)(1) of the Act, 21 U.S.C. section 360bbb-3(b)(1), unless the authorization is terminated or revoked.  Performed at [facility], [address] [place], [place] [address]      Scheduled Meds:  Chlorhexidine Gluconate Cloth  6 each Topical Daily   heparin  5,000 Units Subcutaneous Q8H   Continuous Infusions:  sodium chloride 75 mL/hr at 12/14/21 0329    Procedures/Studies: CT ABDOMEN PELVIS W CONTRAST  Result Date: 12/13/2021 CLINICAL DATA:  Bowel obstruction suspected. Abdominal pain with nausea with onset last night. Patient has indwelling Foley catheter. EXAM: CT ABDOMEN AND PELVIS WITH CONTRAST TECHNIQUE: Multidetector CT imaging of the abdomen and pelvis was performed using the standard protocol following bolus administration of intravenous contrast. CONTRAST:  172mL OMNIPAQUE IOHEXOL 300 MG/ML  SOLN COMPARISON:  100 mL of Omnipaque 300 intravenous. FINDINGS: Lower chest: No acute abnormality. Hepatobiliary: No focal liver abnormality is seen. No gallstones, gallbladder wall thickening, or biliary dilatation. Pancreas: Dilatation of the main pancreatic duct within the pancreatic head neck and proximal body, measuring up to 0.5 cm in diameter (series 2, images 21, 24). There is diffuse mild parenchymal atrophy. No peripancreatic fat stranding or fluid collection. Spleen: Normal in size without focal abnormality. Adrenals/Urinary Tract: Adrenal glands are unremarkable. A 1.8 cm partially exophytic solid mass with attenuation of 48 Hounsfield units is noted at the upper pole of the right kidney, best appreciated on coronal and sagittal views (series 5, image 82; series 6, image 60). Multiple bilateral simple cortical renal cysts  measuring up to 5.8 cm at the lower third of the left kidney (series 2, image 35). No hydronephrosis or nephrolithiasis. Minimally distended urinary bladder with Foley catheter in place. Foley catheter balloon is at the level of the prostatic urethra. Stomach/Bowel: Small hiatal hernia. A 2.1 cm air and fluid-filled diverticulum is noted along the for segment of the duodenum (series 2, image 29). Multiple loops of dilated proximal small bowel measure up to 3.3 cm in diameter. In the mid left abdomen there is a segment of small bowel with small bowel feces sign and gradual tapering to normal caliber (series 2, images 44-55; series 5, image 56). No distinct transition point. There is a trace amount of interloop fluid along this segment of small bowel as well as mild mesenteric edema. The small bowel distal to this area of tapering is decompressed. The appendix appears normal. Vascular/Lymphatic: Aortic atherosclerosis. No enlarged abdominal or pelvic lymph nodes. Reproductive: Stable appearance of the markedly enlarged prostate gland with mass effect on the posterior aspect of the urinary bladder. Other: Trace amount of simple free fluid is noted  in the lower abdomen and pelvis. No free intraperitoneal air. Musculoskeletal: Severe degenerative disc disease at the L2-L5 levels with vacuum disc phenomenon. No acute osseous abnormality or suspicious osseous lesion. IMPRESSION: 1. At least partial small bowel obstruction with gradual tapering of small bowel caliber in the mid left abdomen. No distinct transition point though the distal small bowel is decompressed. 2. Trace abdominopelvic ascites. 3. Foley catheter balloon is at the level of the prostatic urethra. Recommend repositioning. 4. Dilated main pancreatic duct within the pancreatic head, neck, and proximal body. When the patient is clinically stable and able to follow directions and hold their breath (preferably as an outpatient) further evaluation with dedicated  abdominal MRI should be considered. 5. Partially exophytic 1.8 cm solid mass at the superior pole of the right kidney. This can also be evaluated on abdominal MRI. 6. Prostatomegaly, unchanged. Electronically Signed   By: Ileana Roup M.D.   On: 12/13/2021 17:06    Orson Eva, DO  [facility]  If 7PM-7AM, please contact night-coverage [email or web address] Password [email or web address] 12/14/2021, 7:28 AM   LOS: 0 days

## 2021-12-14 NOTE — Progress Notes (Signed)
*  PRELIMINARY RESULTS* Echocardiogram 2D Echocardiogram has been performed.  Carlos Leon 12/14/2021, 11:43 AM

## 2021-12-15 LAB — GLUCOSE, CAPILLARY
Glucose-Capillary: 98 mg/dL (ref 70–99)
Glucose-Capillary: 94 mg/dL (ref 70–99)
Glucose-Capillary: 107 mg/dL — ABNORMAL HIGH (ref 70–99)
Glucose-Capillary: 95 mg/dL (ref 70–99)

## 2021-12-15 LAB — CBC
HCT: 36.7 % — ABNORMAL LOW (ref 39.0–52.0)
Hemoglobin: 11.9 g/dL — ABNORMAL LOW (ref 13.0–17.0)
MCH: 26.6 pg (ref 26.0–34.0)
MCHC: 32.4 g/dL (ref 30.0–36.0)
MCV: 82.1 fL (ref 80.0–100.0)
Platelets: 191 10*3/uL (ref 150–400)
RBC: 4.47 MIL/uL (ref 4.22–5.81)
RDW: 15.8 % — ABNORMAL HIGH (ref 11.5–15.5)
WBC: 9.7 10*3/uL (ref 4.0–10.5)
nRBC: 0 % (ref 0.0–0.2)

## 2021-12-15 LAB — MAGNESIUM: Magnesium: 1.8 mg/dL (ref 1.7–2.4)

## 2021-12-15 LAB — T4, FREE: Free T4: 0.8 ng/dL (ref 0.61–1.12)

## 2021-12-15 LAB — BASIC METABOLIC PANEL
Anion gap: 7 (ref 5–15)
BUN: 19 mg/dL (ref 8–23)
CO2: 27 mmol/L (ref 22–32)
Calcium: 8.8 mg/dL — ABNORMAL LOW (ref 8.9–10.3)
Chloride: 107 mmol/L (ref 98–111)
Creatinine, Ser: 1.33 mg/dL — ABNORMAL HIGH (ref 0.61–1.24)
GFR, Estimated: 56 mL/min — ABNORMAL LOW (ref >60)
Glucose, Bld: 94 mg/dL (ref 70–99)
Potassium: 4.1 mmol/L (ref 3.5–5.1)
Sodium: 141 mmol/L (ref 135–145)

## 2021-12-15 LAB — TSH: TSH: 1.074 u[IU]/mL (ref 0.350–4.500)

## 2021-12-15 LAB — PHOSPHORUS: Phosphorus: 2.9 mg/dL (ref 2.5–4.6)

## 2021-12-15 NOTE — Discharge Summary (Addendum)
Physician Discharge Summary  Carlos Leon EGB:[id number] DOB: 08/18/1946 DOA: 12/13/2021  PCP: [name], MD  Admit date: 12/13/2021 Discharge date: 12/15/2021  Admitted From: Home Disposition:  Home   Recommendations for Outpatient Follow-up:  Follow up with PCP in 1-2 weeks Please obtain BMP/CBC in one week    Discharge Condition: Stable CODE STATUS:FULL Diet recommendation: Heart Healthy / Carb Modified / Dysphagia / Regular   Brief/Interim Summary: 75 year old male with a history of prostate cancer and BPH with indwelling Foley catheter, atrial fibrillation, hyperlipidemia, and gouty arthritis presenting with 1 day history of abdominal pain that began on the evening of 12/12/2021.  He stated that the abdominal pain is mostly upper abdomen and periumbilical that was constant and sharp and severe.  He had an episode of nausea and vomiting.  As result he presented for further evaluation.  Denied any fevers, chills, chest pain, shortness of breath, coughing, hemoptysis.  His last bowel movement was 12/13/2021.  Currently he is not passing any flatus.  He denies any new medications. He quit smoking 30 years ago after approximately 40-pack-year history.  He denies any alcohol or illicit drug use. In the emergency department, the patient was afebrile hemodynamically stable with oxygen saturation 100% room air.  BMP shows sodium 136, potassium 4.1, bicarbonate 29, serum creatinine 1.27.  LFTs were unremarkable.  Lipase 28.  WBC 11.6, hemoglobin 13.4, platelets 216,000.  CT of the abdomen and pelvis showed a partial small bowel obstruction with gradual tapering of the small bowel caliber in the mid left abdomen.  Discharge Diagnoses:  Partial SBO -remained npo initially -general surgery consult>>given cathartics -Continued IV fluids -pt began having BMs -diet advanced which he tolerated -discussed with general surgery>>ok to d/c home   Malpositioned Foley catheter -CT  abdomen pelvis showed that the Foley balloon is in the prostatic urethra -RN could not reposition -urology, Dr. Alyson Ingles consulted>>placed new foley 12/20   Dilated pancreatic duct -LFTs unremarkable -Normal total bilirubin and LFTs -pt tolerating diet and abd pain resolved -outpt GI follow up/MRI   Atrial fibrillation, type unspecified -Patient has never been on anticoagulation -CHADS-VASc =5 (HTN, Age, DM, ASVD) -I discussed risks/benefits/alternatives of AC -he is amenable to starting [facility] once he has recovered from his acute illness -echo EF 55-60, no WMA, mild MR,  trivial AI -start apixaban -TSH 1.074   CKD stage IIIa -Baseline creatinine 1.1-1.4 -serum creatinine 1.33 on day of d/c   Diabetes mellitus type 2 -07/06/2021 Hemoglobin A1c 6.5 -CBGs controlled   Essential hypertension -Holding amlodipine and benazepril secondary to n.p.o.>>restart amlodipine -will not restart benazepril at this time -BP remains well controlled   Class I obesity -BMI of 34.55 -Lifestyle modification     Discharge Instructions   Allergies as of 12/15/2021       Reactions   Chlorthalidone Rash        Medication List     STOP taking these medications    alfuzosin 10 MG 24 hr tablet Commonly known as: UROXATRAL   benazepril 40 MG tablet Commonly known as: LOTENSIN   levofloxacin 750 MG tablet Commonly known as: Levaquin   naproxen sodium 220 MG tablet Commonly known as: ALEVE       TAKE these medications    allopurinol 300 MG tablet Commonly known as: ZYLOPRIM TAKE 1 TABLET EVERY DAY What changed:  when to take this additional instructions   amLODipine 10 MG tablet Commonly known as: NORVASC Take 1 tablet (10 mg total) by mouth daily.  finasteride 5 MG tablet Commonly known as: PROSCAR Take 1 tablet (5 mg total) by mouth daily.        Allergies  Allergen Reactions   Chlorthalidone Rash    Consultations: General  surgery   Procedures/Studies: CT ABDOMEN PELVIS W CONTRAST  Result Date: 12/13/2021 CLINICAL DATA:  Bowel obstruction suspected. Abdominal pain with nausea with onset last night. Patient has indwelling Foley catheter. EXAM: CT ABDOMEN AND PELVIS WITH CONTRAST TECHNIQUE: Multidetector CT imaging of the abdomen and pelvis was performed using the standard protocol following bolus administration of intravenous contrast. CONTRAST:  136mL OMNIPAQUE IOHEXOL 300 MG/ML  SOLN COMPARISON:  100 mL of Omnipaque 300 intravenous. FINDINGS: Lower chest: No acute abnormality. Hepatobiliary: No focal liver abnormality is seen. No gallstones, gallbladder wall thickening, or biliary dilatation. Pancreas: Dilatation of the main pancreatic duct within the pancreatic head neck and proximal body, measuring up to 0.5 cm in diameter (series 2, images 21, 24). There is diffuse mild parenchymal atrophy. No peripancreatic fat stranding or fluid collection. Spleen: Normal in size without focal abnormality. Adrenals/Urinary Tract: Adrenal glands are unremarkable. A 1.8 cm partially exophytic solid mass with attenuation of 48 Hounsfield units is noted at the upper pole of the right kidney, best appreciated on coronal and sagittal views (series 5, image 82; series 6, image 60). Multiple bilateral simple cortical renal cysts measuring up to 5.8 cm at the lower third of the left kidney (series 2, image 35). No hydronephrosis or nephrolithiasis. Minimally distended urinary bladder with Foley catheter in place. Foley catheter balloon is at the level of the prostatic urethra. Stomach/Bowel: Small hiatal hernia. A 2.1 cm air and fluid-filled diverticulum is noted along the for segment of the duodenum (series 2, image 29). Multiple loops of dilated proximal small bowel measure up to 3.3 cm in diameter. In the mid left abdomen there is a segment of small bowel with small bowel feces sign and gradual tapering to normal caliber (series 2, images  44-55; series 5, image 56). No distinct transition point. There is a trace amount of interloop fluid along this segment of small bowel as well as mild mesenteric edema. The small bowel distal to this area of tapering is decompressed. The appendix appears normal. Vascular/Lymphatic: Aortic atherosclerosis. No enlarged abdominal or pelvic lymph nodes. Reproductive: Stable appearance of the markedly enlarged prostate gland with mass effect on the posterior aspect of the urinary bladder. Other: Trace amount of simple free fluid is noted in the lower abdomen and pelvis. No free intraperitoneal air. Musculoskeletal: Severe degenerative disc disease at the L2-L5 levels with vacuum disc phenomenon. No acute osseous abnormality or suspicious osseous lesion. IMPRESSION: 1. At least partial small bowel obstruction with gradual tapering of small bowel caliber in the mid left abdomen. No distinct transition point though the distal small bowel is decompressed. 2. Trace abdominopelvic ascites. 3. Foley catheter balloon is at the level of the prostatic urethra. Recommend repositioning. 4. Dilated main pancreatic duct within the pancreatic head, neck, and proximal body. When the patient is clinically stable and able to follow directions and hold their breath (preferably as an outpatient) further evaluation with dedicated abdominal MRI should be considered. 5. Partially exophytic 1.8 cm solid mass at the superior pole of the right kidney. This can also be evaluated on abdominal MRI. 6. Prostatomegaly, unchanged. Electronically Signed   By: Ileana Roup M.D.   On: 12/13/2021 17:06   ECHOCARDIOGRAM COMPLETE  Result Date: 12/14/2021    ECHOCARDIOGRAM REPORT   Patient Name:  Carlos Leon Date of Exam: 12/14/2021 Medical Rec #:  [id number]       Height:       73.0 in Accession #:    [id number]      Weight:       261.9 lb Date of Birth:  06-Feb-1946       BSA:          2.413 m Patient Age:    75 years        BP:           127/86  mmHg Patient Gender: M               HR:           89 bpm. Exam Location:  [facility] Procedure: 2D Echo, Cardiac Doppler and Color Doppler Indications:    Atrial Fibrillation  History:        Patient has prior history of Echocardiogram examinations, most                 recent 08/14/2013. Arrythmias:Atrial Fibrillation; Risk                 Factors:Hypertension and Dyslipidemia.  Sonographer:    Wenda Low Referring Phys: [phone] Railee Bonillas IMPRESSIONS  1. Left ventricular ejection fraction, by estimation, is 55 to 60%. The left ventricle has normal function. The left ventricle has no regional wall motion abnormalities. Left ventricular diastolic parameters are indeterminate.  2. Right ventricular systolic function is normal. The right ventricular size is normal. There is mildly elevated pulmonary artery systolic pressure.  3. Left atrial size was severely dilated.  4. Right atrial size was severely dilated.  5. The mitral valve is degenerative. Mild mitral valve regurgitation. No evidence of mitral stenosis. Moderate mitral annular calcification.  6. Tricuspid valve regurgitation is moderate.  7. The aortic valve is tricuspid. There is mild calcification of the aortic valve. Aortic valve regurgitation is trivial. Aortic valve sclerosis/calcification is present, without any evidence of aortic stenosis.  8. The inferior vena cava is normal in size with greater than 50% respiratory variability, suggesting right atrial pressure of 3 mmHg. FINDINGS  Left Ventricle: Left ventricular ejection fraction, by estimation, is 55 to 60%. The left ventricle has normal function. The left ventricle has no regional wall motion abnormalities. The left ventricular internal cavity size was normal in size. There is  no left ventricular hypertrophy. Left ventricular diastolic parameters are indeterminate. Right Ventricle: The right ventricular size is normal. No increase in right ventricular wall thickness. Right ventricular systolic  function is normal. There is mildly elevated pulmonary artery systolic pressure. The tricuspid regurgitant velocity is 2.93  m/s, and with an assumed right atrial pressure of 8 mmHg, the estimated right ventricular systolic pressure is 65.9 mmHg. Left Atrium: Left atrial size was severely dilated. Right Atrium: Right atrial size was severely dilated. Pericardium: There is no evidence of pericardial effusion. Mitral Valve: The mitral valve is degenerative in appearance. There is moderate thickening of the mitral valve leaflet(s). There is moderate calcification of the mitral valve leaflet(s). Moderate mitral annular calcification. Mild mitral valve regurgitation. No evidence of mitral valve stenosis. MV peak gradient, 5.3 mmHg. The mean mitral valve gradient is 1.0 mmHg. Tricuspid Valve: The tricuspid valve is normal in structure. Tricuspid valve regurgitation is moderate . No evidence of tricuspid stenosis. Aortic Valve: The aortic valve is tricuspid. There is mild calcification of the aortic valve. Aortic valve regurgitation is trivial. Aortic valve sclerosis/calcification is present,  without any evidence of aortic stenosis. Aortic valve mean gradient measures 4.0 mmHg. Aortic valve peak gradient measures 6.5 mmHg. Aortic valve area, by VTI measures 2.69 cm. Pulmonic Valve: The pulmonic valve was normal in structure. Pulmonic valve regurgitation is mild. No evidence of pulmonic stenosis. Aorta: The aortic root is normal in size and structure. Venous: The inferior vena cava is normal in size with greater than 50% respiratory variability, suggesting right atrial pressure of 3 mmHg. IAS/Shunts: No atrial level shunt detected by color flow Doppler.  LEFT VENTRICLE PLAX 2D LVIDd:         5.20 cm   Diastology LVIDs:         3.90 cm   LV e' medial:    11.30 cm/s LV PW:         1.30 cm   LV E/e' medial:  8.2 LV IVS:        1.10 cm   LV e' lateral:   14.90 cm/s LVOT diam:     2.20 cm   LV E/e' lateral: 6.2 LV SV:          72 LV SV Index:   30 LVOT Area:     3.80 cm  RIGHT VENTRICLE RV Basal diam:  4.00 cm RV Mid diam:    2.70 cm LEFT ATRIUM              Index        RIGHT ATRIUM           Index LA diam:        5.90 cm  2.45 cm/m   RA Area:     30.50 cm LA Vol (A2C):   140.0 ml 58.02 ml/m  RA Volume:   103.00 ml 42.69 ml/m LA Vol (A4C):   122.0 ml 50.56 ml/m LA Biplane Vol: 132.0 ml 54.71 ml/m  AORTIC VALVE                    PULMONIC VALVE AV Area (Vmax):    2.71 cm     PV Vmax:       0.91 m/s AV Area (Vmean):   2.41 cm     PV Peak grad:  3.3 mmHg AV Area (VTI):     2.69 cm AV Vmax:           127.00 cm/s AV Vmean:          89.300 cm/s AV VTI:            0.267 m AV Peak Grad:      6.5 mmHg AV Mean Grad:      4.0 mmHg LVOT Vmax:         90.70 cm/s LVOT Vmean:        56.500 cm/s LVOT VTI:          0.189 m LVOT/AV VTI ratio: 0.71  AORTA Ao Root diam: 3.60 cm Ao Asc diam:  3.60 cm MITRAL VALVE               TRICUSPID VALVE MV Area (PHT): 5.09 cm    TR Peak grad:   34.3 mmHg MV Area VTI:   2.97 cm    TR Vmax:        293.00 cm/s MV Peak grad:  5.3 mmHg MV Mean grad:  1.0 mmHg    SHUNTS MV Vmax:       1.15 m/s    Systemic VTI:  0.19 m MV Vmean:  48.9 cm/s   Systemic Diam: 2.20 cm MV Decel Time: 149 msec MV E velocity: 92.50 cm/s Jenkins Rouge MD Electronically signed by Jenkins Rouge MD Signature Date/Time: 12/14/2021/11:52:03 AM    Final         Discharge Exam: Vitals:   12/14/21 2016 12/15/21 0438  BP: 127/72 116/68  Pulse: 82 71  Resp: 18 18  Temp: 98.5 F (36.9 C) 97.9 F (36.6 C)  SpO2: 97% 98%   Vitals:   12/14/21 0845 12/14/21 1226 12/14/21 2016 12/15/21 0438  BP: (!) 147/89 133/81 127/72 116/68  Pulse: 77 81 82 71  Resp:  20 18 18   Temp:  98.4 F (36.9 C) 98.5 F (36.9 C) 97.9 F (36.6 C)  TempSrc:  Oral Oral Oral  SpO2: 97% 98% 97% 98%  Weight:      Height:        General: Pt is alert, awake, not in acute distress Cardiovascular: RRR, S1/S2 +, no rubs, no gallops Respiratory: CTA  bilaterally, no wheezing, no rhonchi Abdominal: Soft, NT, ND, bowel sounds + Extremities: no edema, no cyanosis   The results of significant diagnostics from this hospitalization (including imaging, microbiology, ancillary and laboratory) are listed below for reference.    Significant Diagnostic Studies: CT ABDOMEN PELVIS W CONTRAST  Result Date: 12/13/2021 CLINICAL DATA:  Bowel obstruction suspected. Abdominal pain with nausea with onset last night. Patient has indwelling Foley catheter. EXAM: CT ABDOMEN AND PELVIS WITH CONTRAST TECHNIQUE: Multidetector CT imaging of the abdomen and pelvis was performed using the standard protocol following bolus administration of intravenous contrast. CONTRAST:  140mL OMNIPAQUE IOHEXOL 300 MG/ML  SOLN COMPARISON:  100 mL of Omnipaque 300 intravenous. FINDINGS: Lower chest: No acute abnormality. Hepatobiliary: No focal liver abnormality is seen. No gallstones, gallbladder wall thickening, or biliary dilatation. Pancreas: Dilatation of the main pancreatic duct within the pancreatic head neck and proximal body, measuring up to 0.5 cm in diameter (series 2, images 21, 24). There is diffuse mild parenchymal atrophy. No peripancreatic fat stranding or fluid collection. Spleen: Normal in size without focal abnormality. Adrenals/Urinary Tract: Adrenal glands are unremarkable. A 1.8 cm partially exophytic solid mass with attenuation of 48 Hounsfield units is noted at the upper pole of the right kidney, best appreciated on coronal and sagittal views (series 5, image 82; series 6, image 60). Multiple bilateral simple cortical renal cysts measuring up to 5.8 cm at the lower third of the left kidney (series 2, image 35). No hydronephrosis or nephrolithiasis. Minimally distended urinary bladder with Foley catheter in place. Foley catheter balloon is at the level of the prostatic urethra. Stomach/Bowel: Small hiatal hernia. A 2.1 cm air and fluid-filled diverticulum is noted along the  for segment of the duodenum (series 2, image 29). Multiple loops of dilated proximal small bowel measure up to 3.3 cm in diameter. In the mid left abdomen there is a segment of small bowel with small bowel feces sign and gradual tapering to normal caliber (series 2, images 44-55; series 5, image 56). No distinct transition point. There is a trace amount of interloop fluid along this segment of small bowel as well as mild mesenteric edema. The small bowel distal to this area of tapering is decompressed. The appendix appears normal. Vascular/Lymphatic: Aortic atherosclerosis. No enlarged abdominal or pelvic lymph nodes. Reproductive: Stable appearance of the markedly enlarged prostate gland with mass effect on the posterior aspect of the urinary bladder. Other: Trace amount of simple free fluid is noted in the lower abdomen  and pelvis. No free intraperitoneal air. Musculoskeletal: Severe degenerative disc disease at the L2-L5 levels with vacuum disc phenomenon. No acute osseous abnormality or suspicious osseous lesion. IMPRESSION: 1. At least partial small bowel obstruction with gradual tapering of small bowel caliber in the mid left abdomen. No distinct transition point though the distal small bowel is decompressed. 2. Trace abdominopelvic ascites. 3. Foley catheter balloon is at the level of the prostatic urethra. Recommend repositioning. 4. Dilated main pancreatic duct within the pancreatic head, neck, and proximal body. When the patient is clinically stable and able to follow directions and hold their breath (preferably as an outpatient) further evaluation with dedicated abdominal MRI should be considered. 5. Partially exophytic 1.8 cm solid mass at the superior pole of the right kidney. This can also be evaluated on abdominal MRI. 6. Prostatomegaly, unchanged. Electronically Signed   By: Ileana Roup M.D.   On: 12/13/2021 17:06   ECHOCARDIOGRAM COMPLETE  Result Date: 12/14/2021    ECHOCARDIOGRAM REPORT    Patient Name:   NATASHA PAULSON Date of Exam: 12/14/2021 Medical Rec #:  [id number]       Height:       73.0 in Accession #:    [id number]      Weight:       261.9 lb Date of Birth:  05-17-1946       BSA:          2.413 m Patient Age:    33 years        BP:           127/86 mmHg Patient Gender: M               HR:           89 bpm. Exam Location:  [facility] Procedure: 2D Echo, Cardiac Doppler and Color Doppler Indications:    Atrial Fibrillation  History:        Patient has prior history of Echocardiogram examinations, most                 recent 08/14/2013. Arrythmias:Atrial Fibrillation; Risk                 Factors:Hypertension and Dyslipidemia.  Sonographer:    Wenda Low Referring Phys: [phone] Shylo Dillenbeck IMPRESSIONS  1. Left ventricular ejection fraction, by estimation, is 55 to 60%. The left ventricle has normal function. The left ventricle has no regional wall motion abnormalities. Left ventricular diastolic parameters are indeterminate.  2. Right ventricular systolic function is normal. The right ventricular size is normal. There is mildly elevated pulmonary artery systolic pressure.  3. Left atrial size was severely dilated.  4. Right atrial size was severely dilated.  5. The mitral valve is degenerative. Mild mitral valve regurgitation. No evidence of mitral stenosis. Moderate mitral annular calcification.  6. Tricuspid valve regurgitation is moderate.  7. The aortic valve is tricuspid. There is mild calcification of the aortic valve. Aortic valve regurgitation is trivial. Aortic valve sclerosis/calcification is present, without any evidence of aortic stenosis.  8. The inferior vena cava is normal in size with greater than 50% respiratory variability, suggesting right atrial pressure of 3 mmHg. FINDINGS  Left Ventricle: Left ventricular ejection fraction, by estimation, is 55 to 60%. The left ventricle has normal function. The left ventricle has no regional wall motion abnormalities. The left ventricular  internal cavity size was normal in size. There is  no left ventricular hypertrophy. Left ventricular diastolic parameters are indeterminate. Right Ventricle: The  right ventricular size is normal. No increase in right ventricular wall thickness. Right ventricular systolic function is normal. There is mildly elevated pulmonary artery systolic pressure. The tricuspid regurgitant velocity is 2.93  m/s, and with an assumed right atrial pressure of 8 mmHg, the estimated right ventricular systolic pressure is 31.5 mmHg. Left Atrium: Left atrial size was severely dilated. Right Atrium: Right atrial size was severely dilated. Pericardium: There is no evidence of pericardial effusion. Mitral Valve: The mitral valve is degenerative in appearance. There is moderate thickening of the mitral valve leaflet(s). There is moderate calcification of the mitral valve leaflet(s). Moderate mitral annular calcification. Mild mitral valve regurgitation. No evidence of mitral valve stenosis. MV peak gradient, 5.3 mmHg. The mean mitral valve gradient is 1.0 mmHg. Tricuspid Valve: The tricuspid valve is normal in structure. Tricuspid valve regurgitation is moderate . No evidence of tricuspid stenosis. Aortic Valve: The aortic valve is tricuspid. There is mild calcification of the aortic valve. Aortic valve regurgitation is trivial. Aortic valve sclerosis/calcification is present, without any evidence of aortic stenosis. Aortic valve mean gradient measures 4.0 mmHg. Aortic valve peak gradient measures 6.5 mmHg. Aortic valve area, by VTI measures 2.69 cm. Pulmonic Valve: The pulmonic valve was normal in structure. Pulmonic valve regurgitation is mild. No evidence of pulmonic stenosis. Aorta: The aortic root is normal in size and structure. Venous: The inferior vena cava is normal in size with greater than 50% respiratory variability, suggesting right atrial pressure of 3 mmHg. IAS/Shunts: No atrial level shunt detected by color flow Doppler.   LEFT VENTRICLE PLAX 2D LVIDd:         5.20 cm   Diastology LVIDs:         3.90 cm   LV e' medial:    11.30 cm/s LV PW:         1.30 cm   LV E/e' medial:  8.2 LV IVS:        1.10 cm   LV e' lateral:   14.90 cm/s LVOT diam:     2.20 cm   LV E/e' lateral: 6.2 LV SV:         72 LV SV Index:   30 LVOT Area:     3.80 cm  RIGHT VENTRICLE RV Basal diam:  4.00 cm RV Mid diam:    2.70 cm LEFT ATRIUM              Index        RIGHT ATRIUM           Index LA diam:        5.90 cm  2.45 cm/m   RA Area:     30.50 cm LA Vol (A2C):   140.0 ml 58.02 ml/m  RA Volume:   103.00 ml 42.69 ml/m LA Vol (A4C):   122.0 ml 50.56 ml/m LA Biplane Vol: 132.0 ml 54.71 ml/m  AORTIC VALVE                    PULMONIC VALVE AV Area (Vmax):    2.71 cm     PV Vmax:       0.91 m/s AV Area (Vmean):   2.41 cm     PV Peak grad:  3.3 mmHg AV Area (VTI):     2.69 cm AV Vmax:           127.00 cm/s AV Vmean:          89.300 cm/s AV VTI:  0.267 m AV Peak Grad:      6.5 mmHg AV Mean Grad:      4.0 mmHg LVOT Vmax:         90.70 cm/s LVOT Vmean:        56.500 cm/s LVOT VTI:          0.189 m LVOT/AV VTI ratio: 0.71  AORTA Ao Root diam: 3.60 cm Ao Asc diam:  3.60 cm MITRAL VALVE               TRICUSPID VALVE MV Area (PHT): 5.09 cm    TR Peak grad:   34.3 mmHg MV Area VTI:   2.97 cm    TR Vmax:        293.00 cm/s MV Peak grad:  5.3 mmHg MV Mean grad:  1.0 mmHg    SHUNTS MV Vmax:       1.15 m/s    Systemic VTI:  0.19 m MV Vmean:      48.9 cm/s   Systemic Diam: 2.20 cm MV Decel Time: 149 msec MV E velocity: 92.50 cm/s Jenkins Rouge MD Electronically signed by Jenkins Rouge MD Signature Date/Time: 12/14/2021/11:52:03 AM    Final     Microbiology: Recent Results (from the past 240 hour(s))  Resp Panel by RT-PCR (Flu A&B, Covid) Nasopharyngeal Swab     Status: None   Collection Time: 12/13/21  6:19 PM   Specimen: Nasopharyngeal Swab; Nasopharyngeal(NP) swabs in vial transport medium  Result Value Ref Range Status   SARS Coronavirus 2 by RT PCR  NEGATIVE NEGATIVE Final    Comment: (NOTE) SARS-CoV-2 target nucleic acids are NOT DETECTED.  The SARS-CoV-2 RNA is generally detectable in upper respiratory specimens during the acute phase of infection. The lowest concentration of SARS-CoV-2 viral copies this assay can detect is 138 copies/mL. A negative result does not preclude SARS-Cov-2 infection and should not be used as the sole basis for treatment or other patient management decisions. A negative result may occur with  improper specimen collection/handling, submission of specimen other than nasopharyngeal swab, presence of viral mutation(s) within the areas targeted by this assay, and inadequate number of viral copies(<138 copies/mL). A negative result must be combined with clinical observations, patient history, and epidemiological information. The expected result is Negative.  Fact Sheet for Patients:  EntrepreneurPulse.com.au  Fact Sheet for Healthcare Providers:  IncredibleEmployment.be  This test is no t yet approved or cleared by the [place] FDA and  has been authorized for detection and/or diagnosis of SARS-CoV-2 by FDA under an Emergency Use Authorization (EUA). This EUA will remain  in effect (meaning this test can be used) for the duration of the COVID-19 declaration under Section 564(b)(1) of the Act, 21 U.S.C.section 360bbb-3(b)(1), unless the authorization is terminated  or revoked sooner.       Influenza A by PCR NEGATIVE NEGATIVE Final   Influenza B by PCR NEGATIVE NEGATIVE Final    Comment: (NOTE) The Xpert Xpress SARS-CoV-2/FLU/RSV plus assay is intended as an aid in the diagnosis of influenza from Nasopharyngeal swab specimens and should not be used as a sole basis for treatment. Nasal washings and aspirates are unacceptable for Xpert Xpress SARS-CoV-2/FLU/RSV testing.  Fact Sheet for Patients: EntrepreneurPulse.com.au  Fact Sheet for  Healthcare Providers: IncredibleEmployment.be  This test is not yet approved or cleared by the [place] FDA and has been authorized for detection and/or diagnosis of SARS-CoV-2 by FDA under an Emergency Use Authorization (EUA). This EUA will remain in effect (meaning  this test can be used) for the duration of the COVID-19 declaration under Section 564(b)(1) of the Act, 21 U.S.C. section 360bbb-3(b)(1), unless the authorization is terminated or revoked.  Performed at [facility], [address] [place], [place] [address]      Labs: Basic Metabolic Panel: Recent Labs  Lab 12/13/21 1521 12/14/21 0515 12/15/21 0509  NA 136 137 141  K 4.1 4.1 4.1  CL 100 101 107  CO2 29 26 27   GLUCOSE 126* 144* 94  BUN 14 15 19   CREATININE 1.27* 1.17 1.33*  CALCIUM 9.4 9.5 8.8*  MG  --   --  1.8  PHOS  --   --  2.9   Liver Function Tests: Recent Labs  Lab 12/13/21 1521  AST 15  ALT 15  ALKPHOS 91  BILITOT 0.6  PROT 7.1  ALBUMIN 3.8   Recent Labs  Lab 12/13/21 1521  LIPASE 28   No results for input(s): AMMONIA in the last 168 hours. CBC: Recent Labs  Lab 12/13/21 1521 12/14/21 0515 12/15/21 0509  WBC 11.6* 11.7* 9.7  NEUTROABS 10.1*  --   --   HGB 13.4 13.2 11.9*  HCT 41.3 40.5 36.7*  MCV 81.0 81.0 82.1  PLT 216 203 191   Cardiac Enzymes: No results for input(s): CKTOTAL, CKMB, CKMBINDEX, TROPONINI in the last 168 hours. BNP: Invalid input(s): POCBNP CBG: Recent Labs  Lab 12/14/21 1601 12/14/21 2109 12/15/21 0441 12/15/21 0740 12/15/21 1135  GLUCAP 94 79 98 94 107*    Time coordinating discharge:  36 minutes  Signed:  Orson Eva, DO [facility] Pager: [phone] 12/15/2021, 12:36 PM

## 2021-12-15 NOTE — Plan of Care (Signed)

## 2021-12-15 NOTE — Progress Notes (Signed)
Nsg Discharge Note  Admit Date:  12/13/2021 Discharge date: 12/15/2021   Carlos Leon to be D/C'd Home per MD order.  AVS completed.  Copy for chart, and copy for patient signed, and dated. Patient/caregiver able to verbalize understanding.  Discharge Medication: Allergies as of 12/15/2021       Reactions   Chlorthalidone Rash        Medication List     STOP taking these medications    alfuzosin 10 MG 24 hr tablet Commonly known as: UROXATRAL   benazepril 40 MG tablet Commonly known as: LOTENSIN   levofloxacin 750 MG tablet Commonly known as: Levaquin   naproxen sodium 220 MG tablet Commonly known as: ALEVE       TAKE these medications    allopurinol 300 MG tablet Commonly known as: ZYLOPRIM TAKE 1 TABLET EVERY DAY What changed:  when to take this additional instructions   amLODipine 10 MG tablet Commonly known as: NORVASC Take 1 tablet (10 mg total) by mouth daily.   finasteride 5 MG tablet Commonly known as: PROSCAR Take 1 tablet (5 mg total) by mouth daily.        Discharge Assessment: Vitals:   12/15/21 0438 12/15/21 1325  BP: 116/68 124/73  Pulse: 71 94  Resp: 18 18  Temp: 97.9 F (36.6 C) 98 F (36.7 C)  SpO2: 98% 99%   Skin clean, dry and intact without evidence of skin break down, no evidence of skin tears noted. IV catheter discontinued intact. Site without signs and symptoms of complications - no redness or edema noted at insertion site, patient denies c/o pain - only slight tenderness at site.  Dressing with slight pressure applied.  D/c Instructions-Education: Discharge instructions given to patient/family with verbalized understanding. D/c education completed with patient/family including follow up instructions, medication list, d/c activities limitations if indicated, with other d/c instructions as indicated by MD - patient able to verbalize understanding, all questions fully answered. Patient instructed to return to ED, call  911, or call MD for any changes in condition.  Patient escorted via [place], and D/C home via private auto.  Dorcas Mcmurray, LPN 45/02/8881 8:00 PM

## 2021-12-15 NOTE — Progress Notes (Signed)
Subjective: Patient had multiple bowel movements over the past 24 hours.  Feels much better.  Denies any nausea.  No abdominal pain noted.  Objective: Vital signs in last 24 hours: Temp:  [97.9 F (36.6 C)-98.5 F (36.9 C)] 97.9 F (36.6 C) (12/20 0438) Pulse Rate:  [71-82] 71 (12/20 0438) Resp:  [18-20] 18 (12/20 0438) BP: (116-147)/(68-89) 116/68 (12/20 0438) SpO2:  [97 %-98 %] 98 % (12/20 0438) Last BM Date: 12/14/21  Intake/Output from previous day: 12/19 0701 - 12/20 0700 In: 234.3 [P.O.:100; I.V.:134.3] Out: 950 [Urine:950] Intake/Output this shift: No intake/output data recorded.  General appearance: alert, cooperative, and no distress GI: soft, non-tender; bowel sounds normal; no masses,  no organomegaly  Lab Results:  Recent Labs    12/14/21 0515 12/15/21 0509  WBC 11.7* 9.7  HGB 13.2 11.9*  HCT 40.5 36.7*  PLT 203 191   BMET Recent Labs    12/13/21 1521 12/14/21 0515  NA 136 137  K 4.1 4.1  CL 100 101  CO2 29 26  GLUCOSE 126* 144*  BUN 14 15  CREATININE 1.27* 1.17  CALCIUM 9.4 9.5   PT/INR No results for input(s): LABPROT, INR in the last 72 hours.  Studies/Results: CT ABDOMEN PELVIS W CONTRAST  Result Date: 12/13/2021 CLINICAL DATA:  Bowel obstruction suspected. Abdominal pain with nausea with onset last night. Patient has indwelling Foley catheter. EXAM: CT ABDOMEN AND PELVIS WITH CONTRAST TECHNIQUE: Multidetector CT imaging of the abdomen and pelvis was performed using the standard protocol following bolus administration of intravenous contrast. CONTRAST:  131mL OMNIPAQUE IOHEXOL 300 MG/ML  SOLN COMPARISON:  100 mL of Omnipaque 300 intravenous. FINDINGS: Lower chest: No acute abnormality. Hepatobiliary: No focal liver abnormality is seen. No gallstones, gallbladder wall thickening, or biliary dilatation. Pancreas: Dilatation of the main pancreatic duct within the pancreatic head neck and proximal body, measuring up to 0.5 cm in diameter (series  2, images 21, 24). There is diffuse mild parenchymal atrophy. No peripancreatic fat stranding or fluid collection. Spleen: Normal in size without focal abnormality. Adrenals/Urinary Tract: Adrenal glands are unremarkable. A 1.8 cm partially exophytic solid mass with attenuation of 48 Hounsfield units is noted at the upper pole of the right kidney, best appreciated on coronal and sagittal views (series 5, image 82; series 6, image 60). Multiple bilateral simple cortical renal cysts measuring up to 5.8 cm at the lower third of the left kidney (series 2, image 35). No hydronephrosis or nephrolithiasis. Minimally distended urinary bladder with Foley catheter in place. Foley catheter balloon is at the level of the prostatic urethra. Stomach/Bowel: Small hiatal hernia. A 2.1 cm air and fluid-filled diverticulum is noted along the for segment of the duodenum (series 2, image 29). Multiple loops of dilated proximal small bowel measure up to 3.3 cm in diameter. In the mid left abdomen there is a segment of small bowel with small bowel feces sign and gradual tapering to normal caliber (series 2, images 44-55; series 5, image 56). No distinct transition point. There is a trace amount of interloop fluid along this segment of small bowel as well as mild mesenteric edema. The small bowel distal to this area of tapering is decompressed. The appendix appears normal. Vascular/Lymphatic: Aortic atherosclerosis. No enlarged abdominal or pelvic lymph nodes. Reproductive: Stable appearance of the markedly enlarged prostate gland with mass effect on the posterior aspect of the urinary bladder. Other: Trace amount of simple free fluid is noted in the lower abdomen and pelvis. No free intraperitoneal air.  Musculoskeletal: Severe degenerative disc disease at the L2-L5 levels with vacuum disc phenomenon. No acute osseous abnormality or suspicious osseous lesion. IMPRESSION: 1. At least partial small bowel obstruction with gradual tapering of  small bowel caliber in the mid left abdomen. No distinct transition point though the distal small bowel is decompressed. 2. Trace abdominopelvic ascites. 3. Foley catheter balloon is at the level of the prostatic urethra. Recommend repositioning. 4. Dilated main pancreatic duct within the pancreatic head, neck, and proximal body. When the patient is clinically stable and able to follow directions and hold their breath (preferably as an outpatient) further evaluation with dedicated abdominal MRI should be considered. 5. Partially exophytic 1.8 cm solid mass at the superior pole of the right kidney. This can also be evaluated on abdominal MRI. 6. Prostatomegaly, unchanged. Electronically Signed   By: Ileana Roup M.D.   On: 12/13/2021 17:06   ECHOCARDIOGRAM COMPLETE  Result Date: 12/14/2021    ECHOCARDIOGRAM REPORT   Patient Name:   Carlos Leon Date of Exam: 12/14/2021 Medical Rec #:  [id number]       Height:       73.0 in Accession #:    [id number]      Weight:       261.9 lb Date of Birth:  12/06/1946       BSA:          2.413 m Patient Age:    75 years        BP:           127/86 mmHg Patient Gender: M               HR:           89 bpm. Exam Location:  [facility] Procedure: 2D Echo, Cardiac Doppler and Color Doppler Indications:    Atrial Fibrillation  History:        Patient has prior history of Echocardiogram examinations, most                 recent 08/14/2013. Arrythmias:Atrial Fibrillation; Risk                 Factors:Hypertension and Dyslipidemia.  Sonographer:    Wenda Low Referring Phys: [phone] DAVID TAT IMPRESSIONS  1. Left ventricular ejection fraction, by estimation, is 55 to 60%. The left ventricle has normal function. The left ventricle has no regional wall motion abnormalities. Left ventricular diastolic parameters are indeterminate.  2. Right ventricular systolic function is normal. The right ventricular size is normal. There is mildly elevated pulmonary artery systolic pressure.  3.  Left atrial size was severely dilated.  4. Right atrial size was severely dilated.  5. The mitral valve is degenerative. Mild mitral valve regurgitation. No evidence of mitral stenosis. Moderate mitral annular calcification.  6. Tricuspid valve regurgitation is moderate.  7. The aortic valve is tricuspid. There is mild calcification of the aortic valve. Aortic valve regurgitation is trivial. Aortic valve sclerosis/calcification is present, without any evidence of aortic stenosis.  8. The inferior vena cava is normal in size with greater than 50% respiratory variability, suggesting right atrial pressure of 3 mmHg. FINDINGS  Left Ventricle: Left ventricular ejection fraction, by estimation, is 55 to 60%. The left ventricle has normal function. The left ventricle has no regional wall motion abnormalities. The left ventricular internal cavity size was normal in size. There is  no left ventricular hypertrophy. Left ventricular diastolic parameters are indeterminate. Right Ventricle: The right ventricular size is normal. No  increase in right ventricular wall thickness. Right ventricular systolic function is normal. There is mildly elevated pulmonary artery systolic pressure. The tricuspid regurgitant velocity is 2.93  m/s, and with an assumed right atrial pressure of 8 mmHg, the estimated right ventricular systolic pressure is 42.6 mmHg. Left Atrium: Left atrial size was severely dilated. Right Atrium: Right atrial size was severely dilated. Pericardium: There is no evidence of pericardial effusion. Mitral Valve: The mitral valve is degenerative in appearance. There is moderate thickening of the mitral valve leaflet(s). There is moderate calcification of the mitral valve leaflet(s). Moderate mitral annular calcification. Mild mitral valve regurgitation. No evidence of mitral valve stenosis. MV peak gradient, 5.3 mmHg. The mean mitral valve gradient is 1.0 mmHg. Tricuspid Valve: The tricuspid valve is normal in structure.  Tricuspid valve regurgitation is moderate . No evidence of tricuspid stenosis. Aortic Valve: The aortic valve is tricuspid. There is mild calcification of the aortic valve. Aortic valve regurgitation is trivial. Aortic valve sclerosis/calcification is present, without any evidence of aortic stenosis. Aortic valve mean gradient measures 4.0 mmHg. Aortic valve peak gradient measures 6.5 mmHg. Aortic valve area, by VTI measures 2.69 cm. Pulmonic Valve: The pulmonic valve was normal in structure. Pulmonic valve regurgitation is mild. No evidence of pulmonic stenosis. Aorta: The aortic root is normal in size and structure. Venous: The inferior vena cava is normal in size with greater than 50% respiratory variability, suggesting right atrial pressure of 3 mmHg. IAS/Shunts: No atrial level shunt detected by color flow Doppler.  LEFT VENTRICLE PLAX 2D LVIDd:         5.20 cm   Diastology LVIDs:         3.90 cm   LV e' medial:    11.30 cm/s LV PW:         1.30 cm   LV E/e' medial:  8.2 LV IVS:        1.10 cm   LV e' lateral:   14.90 cm/s LVOT diam:     2.20 cm   LV E/e' lateral: 6.2 LV SV:         72 LV SV Index:   30 LVOT Area:     3.80 cm  RIGHT VENTRICLE RV Basal diam:  4.00 cm RV Mid diam:    2.70 cm LEFT ATRIUM              Index        RIGHT ATRIUM           Index LA diam:        5.90 cm  2.45 cm/m   RA Area:     30.50 cm LA Vol (A2C):   140.0 ml 58.02 ml/m  RA Volume:   103.00 ml 42.69 ml/m LA Vol (A4C):   122.0 ml 50.56 ml/m LA Biplane Vol: 132.0 ml 54.71 ml/m  AORTIC VALVE                    PULMONIC VALVE AV Area (Vmax):    2.71 cm     PV Vmax:       0.91 m/s AV Area (Vmean):   2.41 cm     PV Peak grad:  3.3 mmHg AV Area (VTI):     2.69 cm AV Vmax:           127.00 cm/s AV Vmean:          89.300 cm/s AV VTI:  0.267 m AV Peak Grad:      6.5 mmHg AV Mean Grad:      4.0 mmHg LVOT Vmax:         90.70 cm/s LVOT Vmean:        56.500 cm/s LVOT VTI:          0.189 m LVOT/AV VTI ratio: 0.71  AORTA Ao  Root diam: 3.60 cm Ao Asc diam:  3.60 cm MITRAL VALVE               TRICUSPID VALVE MV Area (PHT): 5.09 cm    TR Peak grad:   34.3 mmHg MV Area VTI:   2.97 cm    TR Vmax:        293.00 cm/s MV Peak grad:  5.3 mmHg MV Mean grad:  1.0 mmHg    SHUNTS MV Vmax:       1.15 m/s    Systemic VTI:  0.19 m MV Vmean:      48.9 cm/s   Systemic Diam: 2.20 cm MV Decel Time: 149 msec MV E velocity: 92.50 cm/s Olanrewaju Osborn Rouge MD Electronically signed by Nymir Ringler Rouge MD Signature Date/Time: 12/14/2021/11:52:03 AM    Final     Anti-infectives: Anti-infectives (From admission, onward)    None       Assessment/Plan: Impression: Partial bowel obstruction resolved. Plan: Advance to heart healthy diet.  No need for surgical intervention at this time.  LOS: 1 day    Aviva Signs 12/15/2021

## 2021-12-16 ENCOUNTER — Telehealth: Payer: Self-pay | Admitting: *Deleted

## 2021-12-16 NOTE — Telephone Encounter (Signed)
Transition Care Management Unsuccessful Follow-up Telephone Call  Date of discharge and from where:  12/15/2021 Carlos Leon  Attempts:  1st Attempt  Reason for unsuccessful TCM follow-up call:  Left voice message

## 2021-12-22 ENCOUNTER — Ambulatory Visit: Payer: Medicare HMO

## 2021-12-22 ENCOUNTER — Other Ambulatory Visit: Payer: Self-pay

## 2021-12-22 NOTE — Progress Notes (Deleted)
Patient recently had foley catheter changed on 12/15/2021 at hospital. Appt for today cancelled and new nurse visit for January given to patient. Patient voiced understanding.

## 2021-12-24 ENCOUNTER — Telehealth: Payer: Self-pay

## 2021-12-24 ENCOUNTER — Ambulatory Visit: Payer: Medicare HMO

## 2021-12-24 NOTE — Telephone Encounter (Signed)
Patient called office to ask for his January catheter change appointment be made a voiding trial instead- patient would like to try to have catheter removed in January instead of waiting until February as recommended by Dr. Jeffie Pollock.  Message sent to Dr. Jeffie Pollock and appt changed

## 2021-12-29 MED ORDER — ALFUZOSIN HCL ER 10 MG PO TB24: 10.0000 mg | ORAL_TABLET | Freq: Every day | ORAL | 3 refills | Status: DC

## 2021-12-29 NOTE — Telephone Encounter (Signed)
Per Dr. Jeffie Pollock He needs to be on the finasteride which was prescribed on 10/29/21.    HE should also be put back on the alfuzosin 10mg  po qday.  If he still has some, he can resume it and if not, please send in #30 with refills.   We could get him back for a voiding trial in a couple of weeks.      Patient called and notified. Rx sent to pharmacy. Patient voiced understanding.

## 2022-01-12 ENCOUNTER — Other Ambulatory Visit: Payer: Self-pay

## 2022-01-12 ENCOUNTER — Ambulatory Visit (INDEPENDENT_AMBULATORY_CARE_PROVIDER_SITE_OTHER): Payer: Medicare HMO

## 2022-01-12 DIAGNOSIS — R339 Retention of urine, unspecified: Secondary | ICD-10-CM

## 2022-01-12 NOTE — Progress Notes (Signed)
Fill and Pull Catheter Removal  Patient is present today for a catheter removal.  Patient was cleaned and prepped in a sterile fashion 383ml of sterile water/ saline was instilled into the bladder when the patient felt the urge to urinate. 19ml of water was then drained from the balloon.  A 16 coudeFR foley cath was removed from the bladder no complications were noted .  Patient as then given some time to void on their own.  Patient can void  321ml on their own after some time.  Patient tolerated well.  Performed by: Callyn Severtson LPN  Follow up/ Additional notes: Keep scheduled OV  Uroflow  Peak Flow: 11ml Average Flow: 66ml Voided Volume: 33ml Voiding Time: 36sec Flow Time: 36sec Time to Peak Flow: 6sec

## 2022-01-21 ENCOUNTER — Ambulatory Visit (INDEPENDENT_AMBULATORY_CARE_PROVIDER_SITE_OTHER): Payer: Medicare HMO | Admitting: Family Medicine

## 2022-01-21 ENCOUNTER — Other Ambulatory Visit: Payer: Self-pay

## 2022-01-21 ENCOUNTER — Encounter: Payer: Self-pay | Admitting: Family Medicine

## 2022-01-21 VITALS — BP 120/70 | HR 88 | Ht 73.0 in | Wt 271.0 lb

## 2022-01-21 DIAGNOSIS — D573 Sickle-cell trait: Secondary | ICD-10-CM | POA: Diagnosis not present

## 2022-01-21 DIAGNOSIS — E785 Hyperlipidemia, unspecified: Secondary | ICD-10-CM

## 2022-01-21 DIAGNOSIS — E559 Vitamin D deficiency, unspecified: Secondary | ICD-10-CM

## 2022-01-21 DIAGNOSIS — Z0001 Encounter for general adult medical examination with abnormal findings: Secondary | ICD-10-CM

## 2022-01-21 DIAGNOSIS — I10 Essential (primary) hypertension: Secondary | ICD-10-CM | POA: Diagnosis not present

## 2022-01-21 DIAGNOSIS — R7303 Prediabetes: Secondary | ICD-10-CM | POA: Diagnosis not present

## 2022-01-21 DIAGNOSIS — E119 Type 2 diabetes mellitus without complications: Secondary | ICD-10-CM | POA: Diagnosis not present

## 2022-01-21 DIAGNOSIS — E538 Deficiency of other specified B group vitamins: Secondary | ICD-10-CM | POA: Insufficient documentation

## 2022-01-21 DIAGNOSIS — D649 Anemia, unspecified: Secondary | ICD-10-CM | POA: Insufficient documentation

## 2022-01-21 DIAGNOSIS — E7800 Pure hypercholesterolemia, unspecified: Secondary | ICD-10-CM | POA: Insufficient documentation

## 2022-01-21 LAB — POCT GLYCOSYLATED HEMOGLOBIN (HGB A1C): HbA1c, POC (prediabetic range): 5.7 % (ref 5.7–6.4)

## 2022-01-21 NOTE — Assessment & Plan Note (Signed)

## 2022-01-21 NOTE — Progress Notes (Signed)
° °  Carlos Leon     MRN: [id number]      DOB: 31-Aug-1946   HPI: Patient is in for annual physical exam. No other health concerns are expressed or addressed at the visit. Recent labs, if available are reviewed. Immunization is reviewed , and  updated if needed.    PE; BP 120/70    Pulse 88    Ht 6\' 1"  (1.854 m)    Wt 271 lb 0.6 oz (122.9 kg)    SpO2 98%    BMI 35.76 kg/m   Pleasant male, alert and oriented x 3, in no cardio-pulmonary distress. Afebrile. HEENT No facial trauma or asymetry. Sinuses non tender. EOMI External ears normal,  Neck: supple, no adenopathy,JVD or thyromegaly.No bruits.  Chest: Clear to ascultation bilaterally.No crackles or wheezes. Non tender to palpation  Cardiovascular system; Heart sounds normal,  S1 and  S2 ,no S3.  No murmur, or thrill. Apical beat not displaced, irregularly irregular pulse Peripheral pulses normal.  Abdomen: Soft, non tender, no organomegaly or masses. No bruits. Bowel sounds normal. No guarding, tenderness or rebound.    Musculoskeletal exam: Decreased though adequate ROM of spine, hips , shoulders and knees. deformity ,swelling and  crepitus noted. No muscle wasting or atrophy.   Neurologic: Cranial nerves 2 to 12 intact. Power, tone ,sensation and reflexes normal throughout. No disturbance in gait. No tremor.  Skin: Intact, no ulceration, erythema , scaling or rash noted. Pigmentation normal throughout  Psych; Normal mood and affect. Judgement and concentration normal   Assessment & Plan:  Encounter for Medicare annual examination with abnormal findings Annual exam as documented. Counseling done  re healthy lifestyle involving commitment to 150 minutes exercise per week, heart healthy diet, and attaining healthy weight.The importance of adequate sleep also discussed. Regular seat belt use and home safety, is also discussed. Changes in health habits are decided on by the patient with goals and time  frames  set for achieving them. Immunization and cancer screening needs are specifically addressed at this visit.

## 2022-01-21 NOTE — Patient Instructions (Addendum)
F/u in 6 months, call if you need me sooner  Labs today cBC, lipid, cmp and EGFr, vit D  Thankful that you are doing better  Thanks for choosing [facility], we consider it a privelige to serve you.

## 2022-01-22 LAB — CBC
Hematocrit: 38.4 % (ref 37.5–51.0)
Hemoglobin: 12.5 g/dL — ABNORMAL LOW (ref 13.0–17.7)
MCH: 26.3 pg — ABNORMAL LOW (ref 26.6–33.0)
MCHC: 32.6 g/dL (ref 31.5–35.7)
MCV: 81 fL (ref 79–97)
Platelets: 213 10*3/uL (ref 150–450)
RBC: 4.76 x10E6/uL (ref 4.14–5.80)
RDW: 15.9 % — ABNORMAL HIGH (ref 11.6–15.4)
WBC: 7.6 10*3/uL (ref 3.4–10.8)

## 2022-01-22 LAB — CMP14+EGFR
ALT: 15 IU/L (ref 0–44)
AST: 15 IU/L (ref 0–40)
Albumin/Globulin Ratio: 1.7 (ref 1.2–2.2)
Albumin: 4 g/dL (ref 3.7–4.7)
Alkaline Phosphatase: 108 IU/L (ref 44–121)
BUN/Creatinine Ratio: 12 (ref 10–24)
BUN: 15 mg/dL (ref 8–27)
Bilirubin Total: 0.3 mg/dL (ref 0.0–1.2)
CO2: 23 mmol/L (ref 20–29)
Calcium: 9.3 mg/dL (ref 8.6–10.2)
Chloride: 101 mmol/L (ref 96–106)
Creatinine, Ser: 1.26 mg/dL (ref 0.76–1.27)
Globulin, Total: 2.3 g/dL (ref 1.5–4.5)
Glucose: 119 mg/dL — ABNORMAL HIGH (ref 70–99)
Potassium: 4.8 mmol/L (ref 3.5–5.2)
Sodium: 142 mmol/L (ref 134–144)
Total Protein: 6.3 g/dL (ref 6.0–8.5)
eGFR: 59 mL/min/{1.73_m2} — ABNORMAL LOW (ref >59)

## 2022-01-22 LAB — LIPID PANEL
Chol/HDL Ratio: 3.4 ratio (ref 0.0–5.0)
Cholesterol, Total: 183 mg/dL (ref 100–199)
HDL: 54 mg/dL (ref >39)
LDL Chol Calc (NIH): 109 mg/dL — ABNORMAL HIGH (ref 0–99)
Triglycerides: 109 mg/dL (ref 0–149)
VLDL Cholesterol Cal: 20 mg/dL (ref 5–40)

## 2022-01-22 LAB — VITAMIN D 25 HYDROXY (VIT D DEFICIENCY, FRACTURES): Vit D, 25-Hydroxy: 14.9 ng/mL — ABNORMAL LOW (ref 30.0–100.0)

## 2022-01-25 ENCOUNTER — Ambulatory Visit (INDEPENDENT_AMBULATORY_CARE_PROVIDER_SITE_OTHER): Payer: Medicare HMO

## 2022-01-25 ENCOUNTER — Other Ambulatory Visit: Payer: Self-pay

## 2022-01-25 DIAGNOSIS — Z0000 Encounter for general adult medical examination without abnormal findings: Secondary | ICD-10-CM | POA: Diagnosis not present

## 2022-01-25 NOTE — Progress Notes (Signed)
Subjective:   Carlos Leon is a 76 y.o. male who presents for Medicare Annual/Subsequent preventive examination. I connected with  Carlos Leon on 01/25/22 by a audio enabled telemedicine application and verified that I am speaking with the correct person using two identifiers.  Patient Location: Home  Provider Location: Office/Clinic  I discussed the limitations of evaluation and management by telemedicine. The patient expressed understanding and agreed to proceed.   Review of Systems     Carlos Leon , Thank you for taking time to come for your Medicare Wellness Visit. I appreciate your ongoing commitment to your health goals. Please review the following plan we discussed and let me know if I can assist you in the future.   These are the goals we discussed:  Goals      Patient Stated     Play more golf .        This is a list of the screening recommended for you and due dates:  Health Maintenance  Topic Date Due   Zoster (Shingles) Vaccine (1 of 2) 07/13/2023*   COVID-19 Vaccine (4 - Booster for Pfizer series) 07/21/2023*   Tetanus Vaccine  02/06/2022   Hemoglobin A1C  07/21/2022   Colon Cancer Screening  03/26/2030   Pneumonia Vaccine  Completed   Hepatitis C Screening: USPSTF Recommendation to screen - Ages 18-79 yo.  Completed   HPV Vaccine  Aged Out   Flu Shot  Discontinued  *Topic was postponed. The date shown is not the original due date.    Cardiac Risk Factors include: advanced age (>18men, >33 women);hypertension     Objective:    Today's Vitals   01/25/22 1434 01/25/22 1435  PainSc: 0-No pain 0-No pain   There is no height or weight on file to calculate BMI.  Advanced Directives 01/25/2022 12/13/2021 12/13/2021 10/17/2021 10/16/2021 10/09/2021 10/02/2021  Does Patient Have a Medical Advance Directive? No No No No No No No  Would patient like information on creating a medical advance directive? No - Patient declined No - Patient declined No -  Patient declined No - Patient declined - No - Patient declined No - Patient declined  Pre-existing out of facility DNR order (yellow form or pink MOST form) - - - - - - -    Current Medications (verified) Outpatient Encounter Medications as of 01/25/2022  Medication Sig   alfuzosin (UROXATRAL) 10 MG 24 hr tablet Take 1 tablet (10 mg total) by mouth daily with breakfast.   allopurinol (ZYLOPRIM) 300 MG tablet TAKE 1 TABLET EVERY DAY (Patient taking differently: Take 300 mg by mouth. 3 times a week)   amLODipine (NORVASC) 10 MG tablet Take 1 tablet (10 mg total) by mouth daily.   finasteride (PROSCAR) 5 MG tablet Take 1 tablet (5 mg total) by mouth daily.   No facility-administered encounter medications on file as of 01/25/2022.    Allergies (verified) Chlorthalidone   History: Past Medical History:  Diagnosis Date   Arthritis    Atrial fibrillation ([place])    Present by ECG 01/2012   Colon polyps    Diverticulosis of colon    Erectile dysfunction    Essential hypertension    Gout    Hyperlipidemia    Metabolic syndrome X 0/08/4708   Obesity    Past Surgical History:  Procedure Laterality Date   Colon polyps removed  2002   Dr. Tamala Julian   COLONOSCOPY  2004   Dr. Rourk-->poor prep, ACBE showed diverticulosis   COLONOSCOPY  11/24/2011   Procedure: COLONOSCOPY;  Surgeon: Daneil Dolin, MD;  Location: [facility];  Service: Endoscopy;  Laterality: N/A;  9:10   COLONOSCOPY N/A 03/26/2020   Procedure: COLONOSCOPY;  Surgeon: Daneil Dolin, MD;  Location: [facility];  Service: Endoscopy;  Laterality: N/A;  9:30   HERNIA REPAIR     PROSTATE BIOPSY     Family History  Problem Relation Age of Onset   Hypertension Mother    Cancer Mother        Rectal, approximately 21   Diabetes Father    Alzheimer's disease Father    Liver disease Neg Hx    Colon cancer Neg Hx    Social History   Socioeconomic History   Marital status: Married    Spouse name: Not on file   Number of  children: 4   Years of education: 12   Highest education level: 12th grade  Occupational History   Occupation: Retired  Tobacco Use   Smoking status: Former    Packs/day: 2.00    Years: 5.00    Pack years: 10.00    Types: Cigarettes   Smokeless tobacco: Never  Vaping Use   Vaping Use: Never used  Substance and Sexual Activity   Alcohol use: No   Drug use: No   Sexual activity: Not on file  Other Topics Concern   Not on file  Social History Narrative   Lives with wife alone    Social Determinants of Health   Financial Resource Strain: Low Risk    Difficulty of Paying Living Expenses: Not hard at all  Food Insecurity: No Food Insecurity   Worried About Charity fundraiser in the Last Year: Never true   Arboriculturist in the Last Year: Never true  Transportation Needs: No Transportation Needs   Lack of Transportation (Medical): No   Lack of Transportation (Non-Medical): No  Physical Activity: Insufficiently Active   Days of Exercise per Week: 5 days   Minutes of Exercise per Session: 20 min  Stress: No Stress Concern Present   Feeling of Stress : Not at all  Social Connections: Socially Integrated   Frequency of Communication with Friends and Family: Three times a week   Frequency of Social Gatherings with Friends and Family: Once a week   Attends Religious Services: More than 4 times per year   Active Member of [employer] or Organizations: Yes   Attends Music therapist: More than 4 times per year   Marital Status: Married    Tobacco Counseling Counseling given: Not Answered   Clinical Intake:     Pain : No/denies pain Pain Score: 0-No pain     BMI - recorded: 35.77 Nutritional Status: BMI > 30  Obese Nutritional Risks: None Diabetes: Yes CBG done?: No Did pt. bring in CBG monitor from home?: No  How often do you need to have someone help you when you read instructions, pamphlets, or other written materials from your doctor or pharmacy?: 1 -  Never What is the last grade level you completed in school?: 12  Diabetic? no      Activities of Daily Living In your present state of health, do you have any difficulty performing the following activities: 01/25/2022 12/13/2021  Hearing? Tempie Donning  Vision? N N  Difficulty concentrating or making decisions? N N  Walking or climbing stairs? N N  Dressing or bathing? N N  Doing errands, shopping? N N  Conservation officer, nature and  eating ? N -  Using the Toilet? N -  In the past six months, have you accidently leaked urine? N -  Do you have problems with loss of bowel control? N -  Managing your Medications? N -  Managing your Finances? N -  Housekeeping or managing your Housekeeping? N -  Some recent data might be hidden    Patient Care Team: [name], MD as PCP - General Domenic Polite Aloha Gell, MD as PCP - Cardiology (Cardiology) Gala Romney Cristopher Estimable, MD as Consulting Physician (Gastroenterology) [name], RN as [place] any recent Medical Services you may have received from other than [facility] providers in the past year (date may be approximate).     Assessment:   This is a routine wellness examination for Carlos Leon.  Hearing/Vision screen No results found.  Dietary issues and exercise activities discussed: Current Exercise Habits: Home exercise routine, Type of exercise: walking, Time (Minutes): 20, Frequency (Times/Week): 5, Weekly Exercise (Minutes/Week): 100, Intensity: Mild, Exercise limited by: None identified   Goals Addressed             This Visit's Progress    Patient Stated       Play more golf .      Depression Screen PHQ 2/9 Scores 01/25/2022 01/25/2022 01/21/2022 09/08/2021 09/08/2021 09/08/2021 02/06/2021  PHQ - 2 Score 0 0 0 0 0 0 0  PHQ- 9 Score - - - - - - -    Fall Risk Fall Risk  01/25/2022 01/21/2022 09/08/2021 07/20/2021 02/06/2021  Falls in the past year? 0 0 0 0 0  Comment - - - - -  Number falls in past yr: 0 0 -  0 0  Injury with Fall? 0 0 - 0 0  Risk for fall due to : No Fall Risks No Fall Risks - - No Fall Risks  Risk for fall due to: Comment - - - - -  Follow up Falls evaluation completed Falls evaluation completed - - Falls evaluation completed    FALL RISK PREVENTION PERTAINING TO THE HOME:  Any stairs in or around the home? No  If so, are there any without handrails? No  Home free of loose throw rugs in walkways, pet beds, electrical cords, etc? Yes  Adequate lighting in your home to reduce risk of falls? Yes   ASSISTIVE DEVICES UTILIZED TO PREVENT FALLS:  Life alert? No  Use of a cane, walker or w/c? No  Grab bars in the bathroom? No  Shower chair or bench in shower? No  Elevated toilet seat or a handicapped toilet? Yes    MMSE - Mini Mental State Exam 01/25/2022  Not completed: Unable to complete     6CIT Screen 01/25/2022 01/21/2021 01/18/2020 01/15/2019 01/02/2018  What Year? 0 points 0 points 0 points 0 points 0 points  What month? 0 points 0 points 0 points 0 points 0 points  What time? 0 points 0 points 0 points 0 points 0 points  Count back from 20 0 points 0 points 0 points 0 points 0 points  Months in reverse 0 points 0 points 0 points 0 points 0 points  Repeat phrase 0 points 0 points 0 points 0 points 2 points  Total Score 0 0 0 0 2    Immunizations Immunization History  Administered Date(s) Administered   Influenza-Unspecified 03/16/2007   PFIZER(Purple Top)SARS-COV-2 Vaccination 01/31/2020, 02/21/2020, 10/31/2020   Pneumococcal Conjugate-13 08/20/2014   Pneumococcal Polysaccharide-23 02/07/2012  Tdap 02/07/2012    TDAP status: Up to date  Flu Vaccine status: Declined, Education has been provided regarding the importance of this vaccine but patient still declined. Advised may receive this vaccine at local pharmacy or Health Dept. Aware to provide a copy of the vaccination record if obtained from local pharmacy or Health Dept. Verbalized acceptance and  understanding.  Pneumococcal vaccine status: Up to date  Covid-19 vaccine status: Completed vaccines  Qualifies for Shingles Vaccine? Yes   Zostavax completed No   Shingrix Completed?: No.    Education has been provided regarding the importance of this vaccine. Patient has been advised to call insurance company to determine out of pocket expense if they have not yet received this vaccine. Advised may also receive vaccine at local pharmacy or Health Dept. Verbalized acceptance and understanding.  Screening Tests Health Maintenance  Topic Date Due   Zoster Vaccines- Shingrix (1 of 2) 07/13/2023 (Originally 09/16/1965)   COVID-19 Vaccine (4 - Booster for Pfizer series) 07/21/2023 (Originally 12/26/2020)   TETANUS/TDAP  02/06/2022   HEMOGLOBIN A1C  07/21/2022   COLONOSCOPY (Pts 45-28yrs Insurance coverage will need to be confirmed)  03/26/2030   Pneumonia Vaccine 44+ Years old  Completed   Hepatitis C Screening  Completed   HPV VACCINES  Aged Out   INFLUENZA VACCINE  Discontinued    Health Maintenance  There are no preventive care reminders to display for this patient.  Colorectal cancer screening: Type of screening: Colonoscopy. Completed 03/26/20. Repeat every 10 years  Lung Cancer Screening: (Low Dose CT Chest recommended if Age 52-80 years, 30 pack-year currently smoking OR have quit w/in 15years.) does not qualify.   Lung Cancer Screening Referral:   Additional Screening:  Hepatitis C Screening: does qualify; Completed 05/20/2016  Vision Screening: Recommended annual ophthalmology exams for early detection of glaucoma and other disorders of the eye. Is the patient up to date with their annual eye exam?  yes  Who is the provider or what is the name of the office in which the patient attends annual eye exams? My eye doctor If pt is not established with a provider, would they like to be referred to a provider to establish care? No .   Dental Screening: Recommended annual  dental exams for proper oral hygiene  Community Resource Referral / Chronic Care Management: CRR required this visit?  No   CCM required this visit?  No      Plan:     I have personally reviewed and noted the following in the patients chart:   Medical and social history Use of alcohol, tobacco or illicit drugs  Current medications and supplements including opioid prescriptions. Patient is not currently taking opioid prescriptions. Functional ability and status Nutritional status Physical activity Advanced directives List of other physicians Hospitalizations, surgeries, and ER visits in previous 12 months Vitals Screenings to include cognitive, depression, and falls Referrals and appointments  In addition, I have reviewed and discussed with patient certain preventive protocols, quality metrics, and best practice recommendations. A written personalized care plan for preventive services as well as general preventive health recommendations were provided to patient.     Quentin Angst, [place]   01/25/2022   Nurse Notes:

## 2022-01-25 NOTE — Patient Instructions (Signed)
Carlos Leon , Thank you for taking time to come for your Medicare Wellness Visit. I appreciate your ongoing commitment to your health goals. Please review the following plan we discussed and let me know if I can assist you in the future.   Screening recommendations/referrals: Colonoscopy: complete Recommended yearly ophthalmology/optometry visit for glaucoma screening and checkup Recommended yearly dental visit for hygiene and checkup  Vaccinations: Influenza vaccine: patient declined Pneumococcal vaccine: complete Tdap vaccine:complete Shingles vaccine: due now    Advanced directives: mailing patient information  Conditions/risks identified: hypertension   Next appointment: 1 year   Preventive Care 76 Years and Older, Male Preventive care refers to lifestyle choices and visits with your health care provider that can promote health and wellness. What does preventive care include? A yearly physical exam. This is also called an annual well check. Dental exams once or twice a year. Routine eye exams. Ask your health care provider how often you should have your eyes checked. Personal lifestyle choices, including: Daily care of your teeth and gums. Regular physical activity. Eating a healthy diet. Avoiding tobacco and drug use. Limiting alcohol use. Practicing safe sex. Taking low doses of aspirin every day. Taking vitamin and mineral supplements as recommended by your health care provider. What happens during an annual well check? The services and screenings done by your health care provider during your annual well check will depend on your age, overall health, lifestyle risk factors, and family history of disease. Counseling  Your health care provider may ask you questions about your: Alcohol use. Tobacco use. Drug use. Emotional well-being. Home and relationship well-being. Sexual activity. Eating habits. History of falls. Memory and ability to understand  (cognition). Work and work Statistician. Screening  You may have the following tests or measurements: Height, weight, and BMI. Blood pressure. Lipid and cholesterol levels. These may be checked every 5 years, or more frequently if you are over 76 years old. Skin check. Lung cancer screening. You may have this screening every year starting at age 43 if you have a 30-pack-year history of smoking and currently smoke or have quit within the past 15 years. Fecal occult blood test (FOBT) of the stool. You may have this test every year starting at age 76. Flexible sigmoidoscopy or colonoscopy. You may have a sigmoidoscopy every 5 years or a colonoscopy every 10 years starting at age 76. Prostate cancer screening. Recommendations will vary depending on your family history and other risks. Hepatitis C blood test. Hepatitis B blood test. Sexually transmitted disease (STD) testing. Diabetes screening. This is done by checking your blood sugar (glucose) after you have not eaten for a while (fasting). You may have this done every 1-3 years. Abdominal aortic aneurysm (AAA) screening. You may need this if you are a current or former smoker. Osteoporosis. You may be screened starting at age 76 if you are at high risk. Talk with your health care provider about your test results, treatment options, and if necessary, the need for more tests. Vaccines  Your health care provider may recommend certain vaccines, such as: Influenza vaccine. This is recommended every year. Tetanus, diphtheria, and acellular pertussis (Tdap, Td) vaccine. You may need a Td booster every 10 years. Zoster vaccine. You may need this after age 90. Pneumococcal 13-valent conjugate (PCV13) vaccine. One dose is recommended after age 32. Pneumococcal polysaccharide (PPSV23) vaccine. One dose is recommended after age 62. Talk to your health care provider about which screenings and vaccines you need and how often you need  them. This  information is not intended to replace advice given to you by your health care provider. Make sure you discuss any questions you have with your health care provider. Document Released: 01/09/2016 Document Revised: 09/01/2016 Document Reviewed: 10/14/2015 Elsevier Interactive Patient Education  2017 [place] Prevention in the Home Falls can cause injuries. They can happen to people of all ages. There are many things you can do to make your home safe and to help prevent falls. What can I do on the outside of my home? Regularly fix the edges of walkways and driveways and fix any cracks. Remove anything that might make you trip as you walk through a door, such as a raised step or threshold. Trim any bushes or trees on the path to your home. Use bright outdoor lighting. Clear any walking paths of anything that might make someone trip, such as rocks or tools. Regularly check to see if handrails are loose or broken. Make sure that both sides of any steps have handrails. Any raised decks and porches should have guardrails on the edges. Have any leaves, snow, or ice cleared regularly. Use sand or salt on walking paths during winter. Clean up any spills in your garage right away. This includes oil or grease spills. What can I do in the bathroom? Use night lights. Install grab bars by the toilet and in the tub and shower. Do not use towel bars as grab bars. Use non-skid mats or decals in the tub or shower. If you need to sit down in the shower, use a plastic, non-slip stool. Keep the floor dry. Clean up any water that spills on the floor as soon as it happens. Remove soap buildup in the tub or shower regularly. Attach bath mats securely with double-sided non-slip rug tape. Do not have throw rugs and other things on the floor that can make you trip. What can I do in the bedroom? Use night lights. Make sure that you have a light by your bed that is easy to reach. Do not use any sheets or  blankets that are too big for your bed. They should not hang down onto the floor. Have a firm chair that has side arms. You can use this for support while you get dressed. Do not have throw rugs and other things on the floor that can make you trip. What can I do in the kitchen? Clean up any spills right away. Avoid walking on wet floors. Keep items that you use a lot in easy-to-reach places. If you need to reach something above you, use a strong step stool that has a grab bar. Keep electrical cords out of the way. Do not use floor polish or wax that makes floors slippery. If you must use wax, use non-skid floor wax. Do not have throw rugs and other things on the floor that can make you trip. What can I do with my stairs? Do not leave any items on the stairs. Make sure that there are handrails on both sides of the stairs and use them. Fix handrails that are broken or loose. Make sure that handrails are as long as the stairways. Check any carpeting to make sure that it is firmly attached to the stairs. Fix any carpet that is loose or worn. Avoid having throw rugs at the top or bottom of the stairs. If you do have throw rugs, attach them to the floor with carpet tape. Make sure that you have a light switch at  the top of the stairs and the bottom of the stairs. If you do not have them, ask someone to add them for you. What else can I do to help prevent falls? Wear shoes that: Do not have high heels. Have rubber bottoms. Are comfortable and fit you well. Are closed at the toe. Do not wear sandals. If you use a stepladder: Make sure that it is fully opened. Do not climb a closed stepladder. Make sure that both sides of the stepladder are locked into place. Ask someone to hold it for you, if possible. Clearly mark and make sure that you can see: Any grab bars or handrails. First and last steps. Where the edge of each step is. Use tools that help you move around (mobility aids) if they are  needed. These include: Canes. Walkers. Scooters. Crutches. Turn on the lights when you go into a dark area. Replace any light bulbs as soon as they burn out. Set up your furniture so you have a clear path. Avoid moving your furniture around. If any of your floors are uneven, fix them. If there are any pets around you, be aware of where they are. Review your medicines with your doctor. Some medicines can make you feel dizzy. This can increase your chance of falling. Ask your doctor what other things that you can do to help prevent falls. This information is not intended to replace advice given to you by your health care provider. Make sure you discuss any questions you have with your health care provider. Document Released: 10/09/2009 Document Revised: 05/20/2016 Document Reviewed: 01/17/2015 Elsevier Interactive Patient Education  2017 [employer].

## 2022-01-27 ENCOUNTER — Other Ambulatory Visit: Payer: Self-pay | Admitting: Family Medicine

## 2022-01-27 ENCOUNTER — Other Ambulatory Visit: Payer: Self-pay

## 2022-01-27 MED ORDER — ERGOCALCIFEROL 1.25 MG (50000 UT) PO CAPS: 50000.0000 [IU] | ORAL_CAPSULE | ORAL | 2 refills | Status: DC

## 2022-01-28 ENCOUNTER — Ambulatory Visit: Payer: Medicare HMO | Admitting: Urology

## 2022-01-29 ENCOUNTER — Other Ambulatory Visit: Payer: Self-pay

## 2022-01-29 MED ORDER — ALFUZOSIN HCL ER 10 MG PO TB24: 10.0000 mg | ORAL_TABLET | Freq: Every day | ORAL | 3 refills | Status: DC

## 2022-02-04 ENCOUNTER — Other Ambulatory Visit: Payer: Self-pay

## 2022-02-04 MED ORDER — ALFUZOSIN HCL ER 10 MG PO TB24: 10.0000 mg | ORAL_TABLET | Freq: Every day | ORAL | 3 refills | Status: DC

## 2022-02-11 ENCOUNTER — Ambulatory Visit (INDEPENDENT_AMBULATORY_CARE_PROVIDER_SITE_OTHER): Payer: Medicare HMO | Admitting: Physician Assistant

## 2022-02-11 ENCOUNTER — Other Ambulatory Visit: Payer: Self-pay

## 2022-02-11 VITALS — BP 155/76 | HR 96

## 2022-02-11 DIAGNOSIS — R972 Elevated prostate specific antigen [PSA]: Secondary | ICD-10-CM | POA: Diagnosis not present

## 2022-02-11 DIAGNOSIS — R8281 Pyuria: Secondary | ICD-10-CM

## 2022-02-11 DIAGNOSIS — C61 Malignant neoplasm of prostate: Secondary | ICD-10-CM

## 2022-02-11 DIAGNOSIS — R339 Retention of urine, unspecified: Secondary | ICD-10-CM

## 2022-02-11 LAB — URINALYSIS, ROUTINE W REFLEX MICROSCOPIC
Bilirubin, UA: NEGATIVE
Glucose, UA: NEGATIVE
Ketones, UA: NEGATIVE
Nitrite, UA: NEGATIVE
Specific Gravity, UA: 1.02 (ref 1.005–1.030)
Urobilinogen, Ur: 0.2 mg/dL (ref 0.2–1.0)
pH, UA: 6.5 (ref 5.0–7.5)

## 2022-02-11 LAB — MICROSCOPIC EXAMINATION
Bacteria, UA: NONE SEEN
Epithelial Cells (non renal): NONE SEEN /hpf (ref 0–10)
Renal Epithel, UA: NONE SEEN /hpf
WBC, UA: >30 /hpf — AB (ref 0–5)

## 2022-02-11 NOTE — Progress Notes (Signed)
post void residual=31 

## 2022-02-11 NOTE — Progress Notes (Signed)
Assessment: 1. Urinary retention   2. Elevated PSA   3. Prostate cancer ([place])   4. Pyuria     Plan: Continue alfuzosin and finasteride daily.  He understands that a simple prostatectomy may be indicated if he has recurrence of retention issues due to the size of his enlarged prostate.  Will monitor small volume GG 1 prostate cancer found in 1 core in November 2022 with recheck PSA in 3 to 4 months with PVR and urinalysis at office visit.  Urine sent for culture and will treat with antibiotics pending results.  He is advised to increase fluid intake and understands he may need treatment for UTI if culture is positive.  Discussed signs and symptoms of prostatitis, of which he has none at the moment.  If they should develop or if his burning worsens, he will let us know so that we can initiate treatment for prostatitis.  Chief Complaint: Follow-up urinary retention  HPI: Carlos Leon is a 76 y.o. male who presents for continued evaluation of urinary retention.  He has been doing well on finasteride and alfuzosin and has no complaints of weakness of stream.  He has occasional frequency and urgency but no incontinence.  He voids 2-3 times per night.  He also admits to a 1 week history of intermittent burning while voiding.  No gross hematuria.  No fever, chills, abdominal pain.  He has been doing well since his episode of retention following his prostate biopsy in November 22 when prostate CA diagnosed.  His most recent PSA of 25.2 was pre biopsy.  IPSS score of 5 quality-of-life score 0, delighted UA = greater than 30 white blood cells, 3-10 red blood cells, no bacteria, nitrite negative  02/19/21: Carlos Leon is a 76 yo male who is sent by Dr. Moshe Cipro for an elevated PSA that was 12.8 on 11/19/19 and 16.1 on 11/24/20.   I saw him in 2015 for a PSA of 9.28 and recommended a biopsy.  I had seen him in 2013 as well with a PSA of 7.0 and recommended a biopsy.  His PSA in 2012 was 5.99.  He  reports that he had a biopsy done at [facility] about 5 years ago.  The biopsy was negative.  He is voiding with moderate LUTS and an IPSS of 18/2.   He has nocturia x 3 and some frequency and urgency.  He has had no hematuria or dysuria.  I have reviewed the records and labs from his ER visit.    10/15/21: Carlos Leon returns today in f/u.  He was seen in the ER on 10/6 and was found to be in retention with 1050ml PVR.  He has moderate LUTS with nocturia x3 and the retention was sudden in onset.  He has known BPH with a high PSA but negative prior biopsy.  HIs last PSA was 16.1 but he never returned for the MRI that I ordered.  He had >50 RBC's but not gross hematuria.  He returned to the ER the next day for gross hematuria and had to have the catheter irrigated.   A culture was negative but he was started on keflex and the UA had >50 WBC.  The urine is now clear for the past week.  He is on no BPH meds.    10/29/21: Carlos Leon returns today in f/u.  He had to return to the ER after his visit on 10/20 for foley replacement.  The PSA done on 10/15/21 was up to  25 from 16.  He had Klebsiella on a culture from 10/16/21.   He has completed the antibiotic he was given and is doing well with the foley now.   He remains on alfuzosin.  He hasn't had the CT hematuria study that I ordered.    11/05/21: [name] returns today in f/u.  His foley was repositioned after the CT showed that the foley balloon was in the prostatic urethra and he came in earlier this week and had it repositioned.  He is have some leakage around the catheter but only if he gets an urge and tries to void.  He has no hematuria since the initial manipulation.  He remains on finasteride and alfuzosin.      11/26/21:   Carlos Leon returns today in f/u to discuss his prostate biopsy.  His Prostate volume was 263ml and he had a single core at the left base with 5% GG1 disease.   He remains on finasteride and has a foley in place for retention.     Portions of the above documentation were copied from a prior visit for review purposes only.  Allergies: Allergies  Allergen Reactions   Chlorthalidone Rash    PMH: Past Medical History:  Diagnosis Date   Arthritis    Atrial fibrillation ([place])    Present by ECG 01/2012   Colon polyps    Diverticulosis of colon    Erectile dysfunction    Essential hypertension    Gout    Hyperlipidemia    Metabolic syndrome X 07/28/4234   Obesity     PSH: Past Surgical History:  Procedure Laterality Date   Colon polyps removed  2002   Dr. Tamala Julian   COLONOSCOPY  2004   Dr. Misty Stanley prep, ACBE showed diverticulosis   COLONOSCOPY  11/24/2011   Procedure: COLONOSCOPY;  Surgeon: Daneil Dolin, MD;  Location: [facility];  Service: Endoscopy;  Laterality: N/A;  9:10   COLONOSCOPY N/A 03/26/2020   Procedure: COLONOSCOPY;  Surgeon: Daneil Dolin, MD;  Location: [facility];  Service: Endoscopy;  Laterality: N/A;  9:30   HERNIA REPAIR     PROSTATE BIOPSY      SH: Social History   Tobacco Use   Smoking status: Former    Packs/day: 2.00    Years: 5.00    Pack years: 10.00    Types: Cigarettes   Smokeless tobacco: Never  Vaping Use   Vaping Use: Never used  Substance Use Topics   Alcohol use: No   Drug use: No    ROS: Constitutional:  Negative for fever, chills, weight loss CV: Negative for chest pain, previous MI, hypertension Respiratory:  Negative for shortness of breath, wheezing, sleep apnea, frequent cough GI:  Negative for nausea, vomiting, bloody stool, GERD  PE: BP (!) 155/76    Pulse 96  GENERAL APPEARANCE:  Well appearing, well developed, well nourished, NAD HEENT:  Atraumatic, normocephalic NECK:  Supple. Trachea midline ABDOMEN:  Soft, non-tender, no masses EXTREMITIES:  Moves all extremities well, without clubbing, cyanosis, or edema NEUROLOGIC:  Alert and oriented x 3, normal gait, CN II-XII grossly intact MENTAL STATUS:  appropriate BACK:  Non-tender to  palpation, No CVAT SKIN:  Warm, dry, and intact   Results: Laboratory Data: Lab Results  Component Value Date   WBC 7.6 01/21/2022   HGB 12.5 (L) 01/21/2022   HCT 38.4 01/21/2022   MCV 81 01/21/2022   PLT 213 01/21/2022    Lab Results  Component  Value Date   CREATININE 1.26 01/21/2022    Lab Results  Component Value Date   PSA 12.8 (H) 11/19/2019   PSA 12.6 (H) 10/03/2018   PSA 10.6 (H) 10/01/2017    No results found for: TESTOSTERONE  Lab Results  Component Value Date   HGBA1C 5.7 01/21/2022    Urinalysis    Component Value Date/Time   COLORURINE BROWN (A) 10/16/2021 1116   APPEARANCEUR Hazy (A) 11/05/2021 1005   LABSPEC 1.015 10/16/2021 1116   PHURINE 7.0 10/16/2021 1116   GLUCOSEU Negative 11/05/2021 1005   HGBUR LARGE (A) 10/16/2021 1116   BILIRUBINUR Negative 11/05/2021 1005   KETONESUR NEGATIVE 10/16/2021 1116   PROTEINUR 3+ (A) 11/05/2021 1005   PROTEINUR 100 (A) 10/16/2021 1116   UROBILINOGEN 0.2 09/27/2018 1131   NITRITE Positive (A) 11/05/2021 1005   NITRITE POSITIVE (A) 10/16/2021 1116   LEUKOCYTESUR 3+ (A) 11/05/2021 1005   LEUKOCYTESUR SMALL (A) 10/16/2021 1116    Lab Results  Component Value Date   LABMICR See below: 11/05/2021   WBCUA >30 (A) 11/05/2021   LABEPIT 0-10 11/05/2021   MUCUS Present 11/05/2021   BACTERIA Many (A) 11/05/2021    Pertinent Imaging:  No results found for this or any previous visit.  No results found for this or any previous visit.  No results found for this or any previous visit.  No results found for this or any previous visit.  Results for orders placed during the hospital encounter of 07/30/13  US Renal  Narrative *RADIOLOGY REPORT*  Clinical Data: Proteinuria.  Hypertension.  Diabetes.  RENAL/URINARY TRACT ULTRASOUND COMPLETE  Comparison:  With  Findings:  Right Kidney:  11.6 cm. No hydronephrosis.  Normal renal cortical thickness.  A suspicion of mildly increased echogenicity,  as evidenced by similarity to the adjacent liver.  Probable small cyst identified.  Upper pole 1.7 cm lesion demonstrates marked hypoechogenicity and enhanced through transmission.  Left Kidney:  4.5 cm. No hydronephrosis.  Left renal cysts.  The largest is in the lower pole and measures 3.0 cm.  Bladder:  Within normal limits.  Prostatomegaly incidentally noted.  IMPRESSION: No hydronephrosis or significant cortical thinning.  Suspicion of mildly increased renal echogenicity, as can be seen with medical renal disease.  Bilateral renal lesions which are likely cysts.  Favor complex cyst in the upper pole right kidney.  Consider ultrasound evaluation at 6 months to confirm stability.  If definitive characterization is desired, pre and post contrast MRI could be performed.  Prostatomegaly.   Original Report Authenticated By: Abigail Miyamoto, M.D.  No results found for this or any previous visit.  Results for orders placed during the hospital encounter of 10/30/21  CT HEMATURIA WORKUP  Narrative CLINICAL DATA:  Gross hematuria.  EXAM: CT ABDOMEN AND PELVIS WITHOUT AND WITH CONTRAST  TECHNIQUE: Multidetector CT imaging of the abdomen and pelvis was performed following the standard protocol before and following the bolus administration of intravenous contrast.  CONTRAST:  165mL OMNIPAQUE IOHEXOL 300 MG/ML  SOLN  COMPARISON:  None.  FINDINGS: Lower Chest: No acute findings.  Hepatobiliary: No hepatic masses identified. Gallbladder is unremarkable. No evidence of biliary ductal dilatation.  Pancreas:  No mass or inflammatory changes.  Spleen: Within normal limits in size and appearance.  Adrenals/Urinary Tract: No adrenal masses identified. No evidence of urolithiasis or hydronephrosis. Multiple benign-appearing renal cysts are seen bilaterally. No complex cystic or solid renal masses identified. No masses seen involving the collecting systems, ureters, or bladder.  Diffuse bladder  wall thickening is seen, consistent with chronic bladder outlet obstruction. Markedly enlarged prostate gland shows mass effect on bladder base.  Stomach/Bowel: No evidence of obstruction, inflammatory process or abnormal fluid collections. Normal appendix visualized.  Vascular/Lymphatic: No pathologically enlarged lymph nodes. No acute vascular findings. Aortic atherosclerotic calcification noted.  Reproductive: Markedly enlarged prostate gland is seen with mass effect on the bladder base. A Foley catheter is seen in place with the balloon located in the prostatic portion of the urethra.  Other:  None.  Musculoskeletal:  No suspicious bone lesions identified.  IMPRESSION: No radiographic evidence of urinary tract neoplasm, urolithiasis, or hydronephrosis.  Markedly enlarged prostate, with findings of chronic bladder outlet obstruction.  Foley catheter balloon located in the prostatic portion of the urethra. Consider repositioning.  Aortic Atherosclerosis (ICD10-I70.0).   Electronically Signed By: Marlaine Hind M.D. On: 10/30/2021 13:55  No results found for this or any previous visit.  No results found for this or any previous visit (from the past 24 hour(s)).

## 2022-02-13 LAB — URINE CULTURE

## 2022-02-18 ENCOUNTER — Telehealth: Payer: Medicare HMO

## 2022-02-18 ENCOUNTER — Telehealth: Payer: Self-pay | Admitting: *Deleted

## 2022-02-18 NOTE — Telephone Encounter (Signed)
°  Care Management   Follow Up Note   02/18/2022 Name: Carlos Leon MRN: [id number] DOB: January 08, 1946   Referred by: [name], MD Reason for referral : Chronic Care Management (HTN, HLD)   An unsuccessful telephone outreach was attempted today. The patient was referred to the case management team for assistance with care management and care coordination.   Follow Up Plan: Telephone follow up appointment with care management team member scheduled for:  upon care guide rescheduling.  Jacqlyn Larsen [facility], BSN RN Case Manager [facility] [phone]

## 2022-02-23 ENCOUNTER — Telehealth: Payer: Self-pay

## 2022-02-23 NOTE — Telephone Encounter (Signed)
-----   Message from Irine Seal, MD sent at 02/22/2022  5:04 PM EST ----- Cx is negative.  He just has chronic pyuria.  ----- Message ----- From: Dorisann Frames, RN Sent: 02/22/2022   4:02 PM EST To: Irine Seal, MD, #  Please review in [place] absence

## 2022-02-23 NOTE — Telephone Encounter (Signed)
Patient called and notified. Voiced understanding.  

## 2022-05-20 ENCOUNTER — Other Ambulatory Visit: Payer: Medicare HMO

## 2022-05-27 ENCOUNTER — Ambulatory Visit: Payer: Medicare HMO | Admitting: Urology

## 2022-05-27 IMAGING — CT CT ABD-PEL WO/W CM
5 of 12 series · 14 of 46 positions shown, 19 images · IV contrast (omnipaque)
Comparison: None.

CLINICAL DATA: Gross hematuria.

EXAM:
CT ABDOMEN AND PELVIS WITHOUT AND WITH CONTRAST
TECHNIQUE: Multidetector CT imaging of the abdomen and pelvis was performed
following the standard protocol before and following the bolus
administration of intravenous contrast.
CONTRAST:  100mL OMNIPAQUE IOHEXOL 300 MG/ML  SOLN

[Series 2: axial pre · axial · non-contrast · 0.77mm/px · z∈[+963,+1213]mm · 3 of 101 slices shown, 7 images]
[im 26/101  soft-tissue]
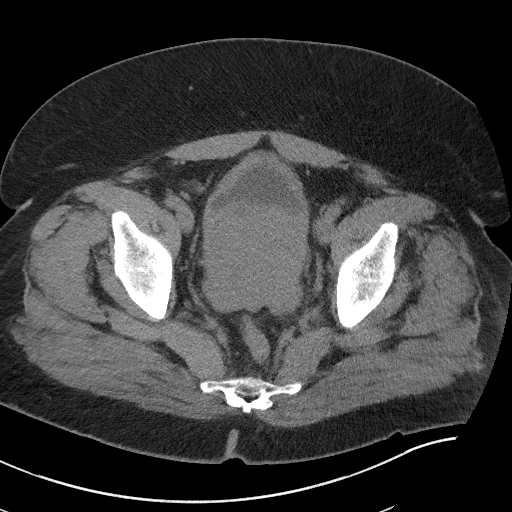
[im 26/101  lung]
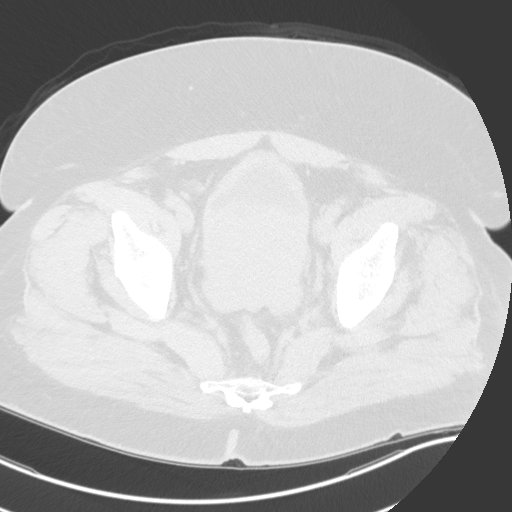
[im 26/101  bone]
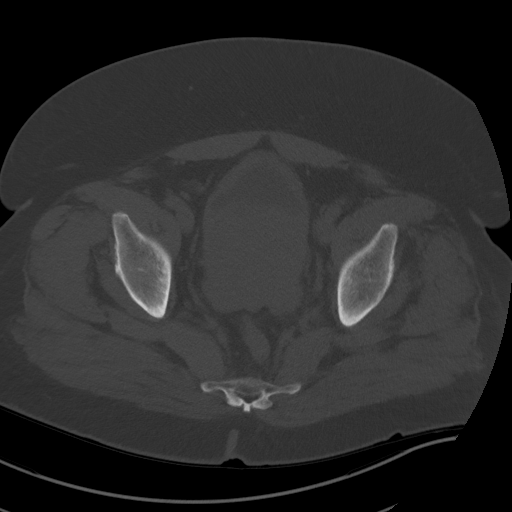
[im 51/101  soft-tissue]
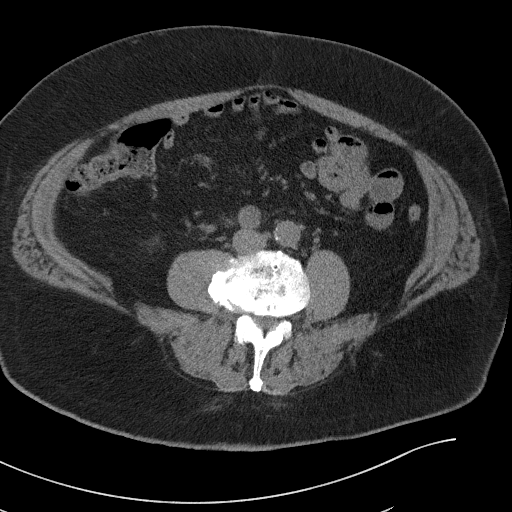
[im 51/101  lung]
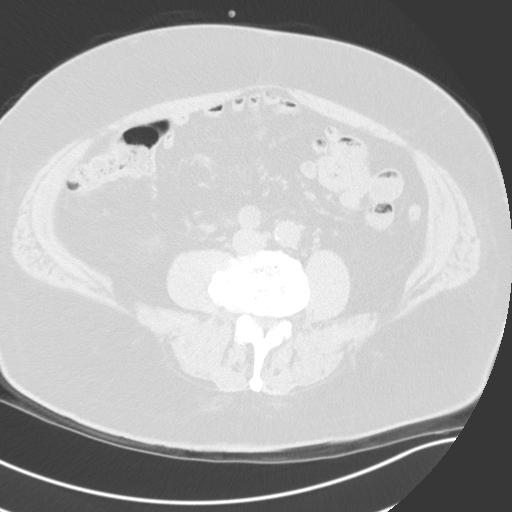
[im 76/101  soft-tissue]
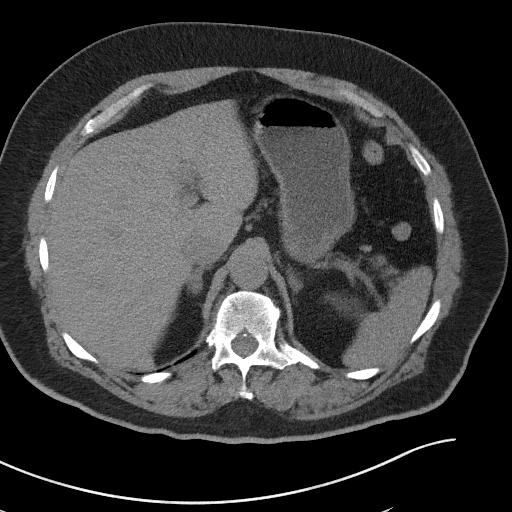
[im 76/101  lung]
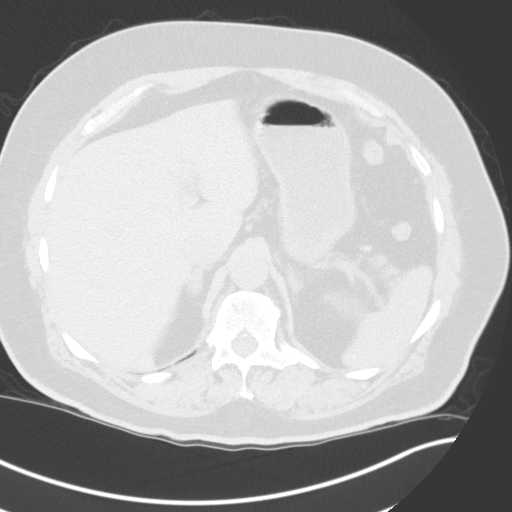

[Series 4: lung pre · axial · non-contrast · 0.77mm/px · z∈[+884,+1136]mm · 5 of 253 slices shown]
[im 26/253  bone]
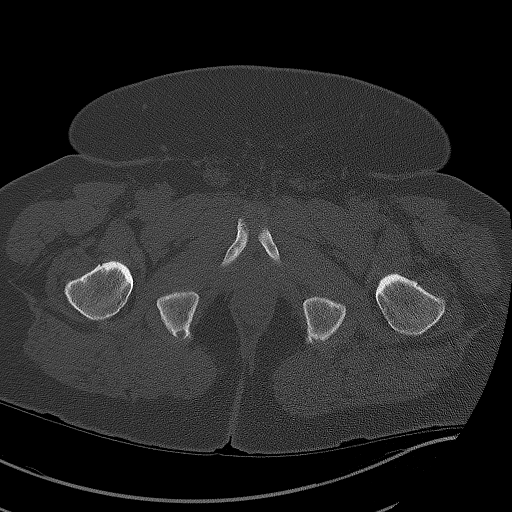
[im 51/253  bone]
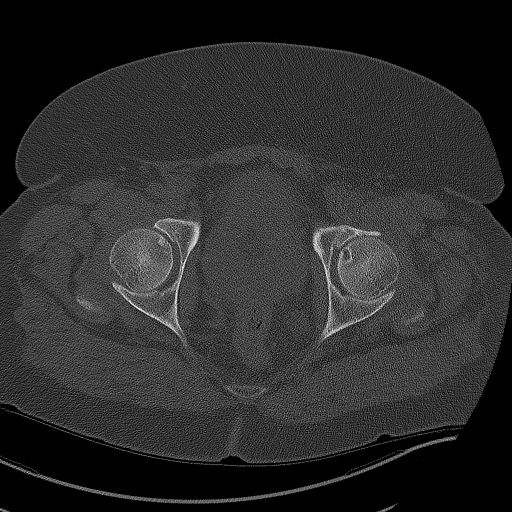
[im 76/253  bone]
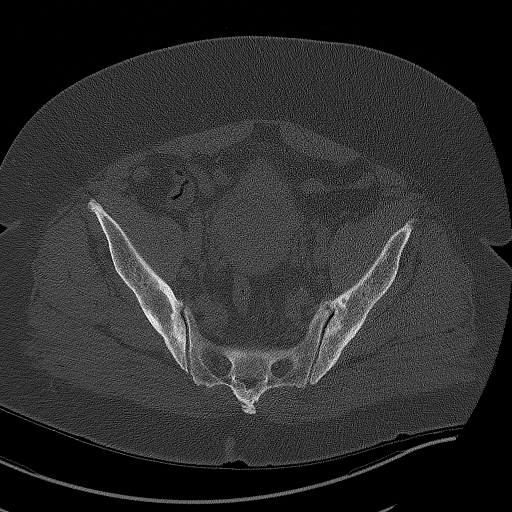
[im 101/253  bone]
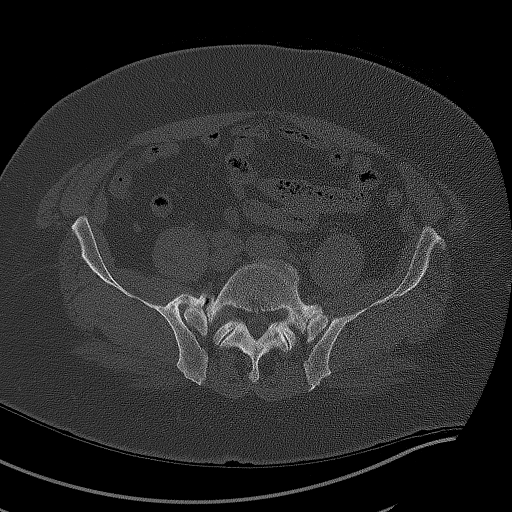
[im 152/253  bone]
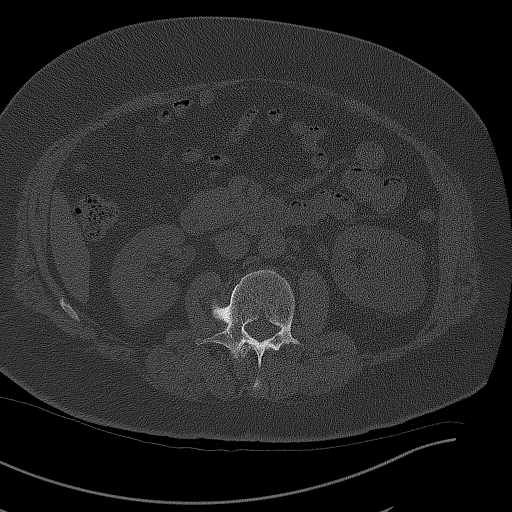

[Series 5: coronal pre · coronal · non-contrast · 1.00mm/px · 2 of 108 slices shown, 3 images]
[im 36/108  soft-tissue]
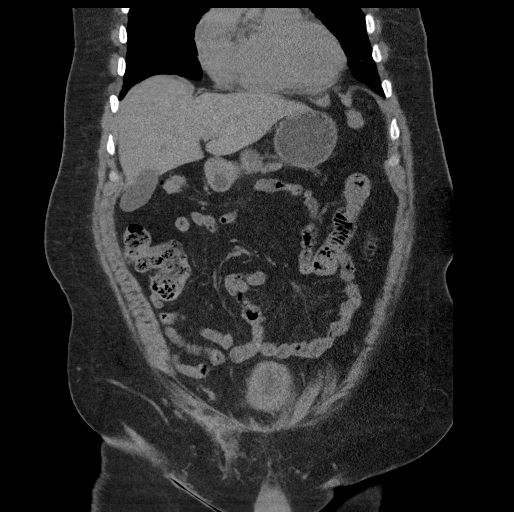
[im 36/108  bone]
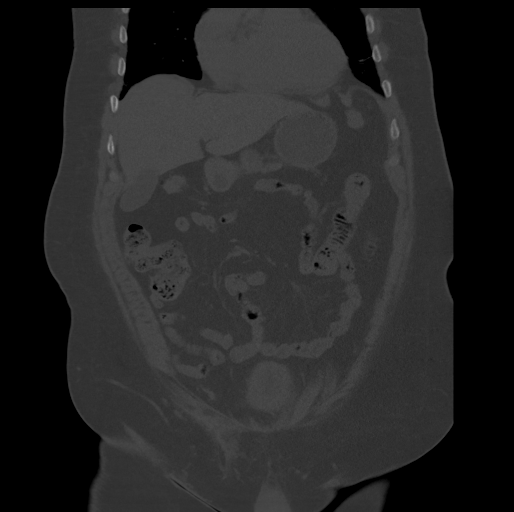
[im 72/108  soft-tissue]
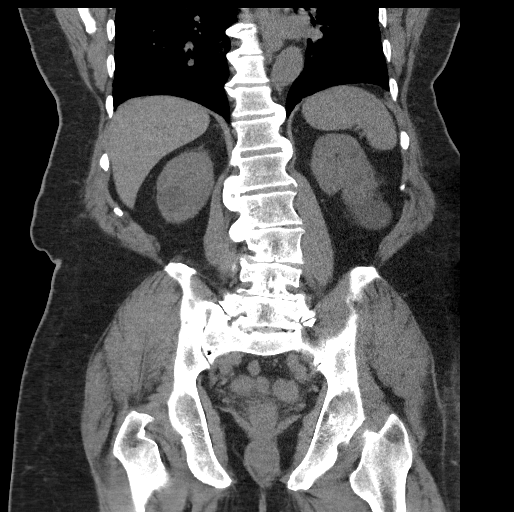

[Series 7: axial post · axial · 0.77mm/px · z∈[+1003,+1168]mm · 2 of 101 slices shown]
[im 34/101  soft-tissue]
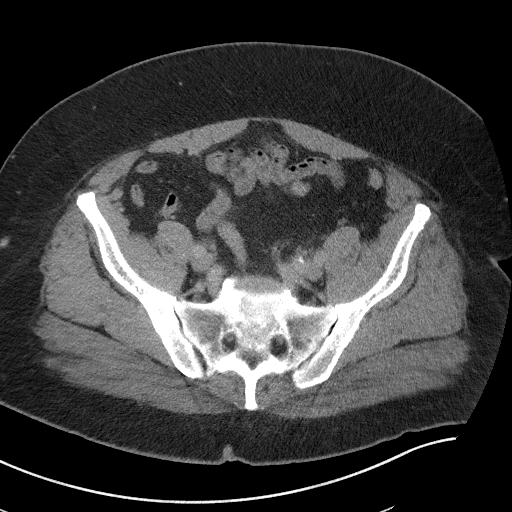
[im 67/101  soft-tissue]
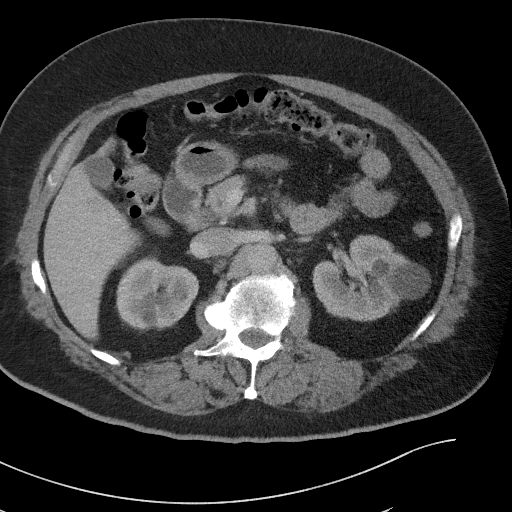

[Series 8: axial delay · axial · delayed · 0.77mm/px · z∈[+1003,+1168]mm · 2 of 101 slices shown]
[im 34/101  soft-tissue]
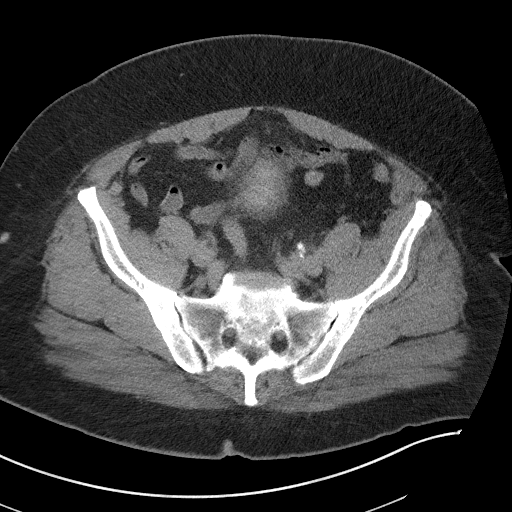
[im 67/101  soft-tissue]
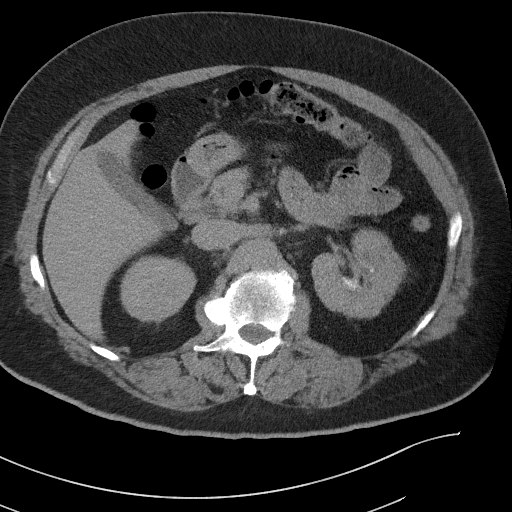

[14 of 46 positions shown; findings below may reference images not displayed]

FINDINGS: Lower Chest: No acute findings.

Hepatobiliary: No hepatic masses identified. Gallbladder is
unremarkable. No evidence of biliary ductal dilatation.

Pancreas:  No mass or inflammatory changes.

Spleen: Within normal limits in size and appearance.

Adrenals/Urinary Tract: No adrenal masses identified. No evidence of
urolithiasis or hydronephrosis. Multiple benign-appearing renal
cysts are seen bilaterally. No complex cystic or solid renal masses
identified. No masses seen involving the collecting systems,
ureters, or bladder. Diffuse bladder wall thickening is seen,
consistent with chronic bladder outlet obstruction. Markedly
enlarged prostate gland shows mass effect on bladder base.

Stomach/Bowel: No evidence of obstruction, inflammatory process or
abnormal fluid collections. Normal appendix visualized.

Vascular/Lymphatic: No pathologically enlarged lymph nodes. No acute
vascular findings. Aortic atherosclerotic calcification noted.

Reproductive: Markedly enlarged prostate gland is seen with mass
effect on the bladder base. A Foley catheter is seen in place with
the balloon located in the prostatic portion of the urethra.

Other:  None.

Musculoskeletal:  No suspicious bone lesions identified.
IMPRESSION: No radiographic evidence of urinary tract neoplasm, urolithiasis, or
hydronephrosis.

Markedly enlarged prostate, with findings of chronic bladder outlet
obstruction.

Foley catheter balloon located in the prostatic portion of the
urethra. Consider repositioning.

Aortic Atherosclerosis ([id number].C).

## 2022-06-05 ENCOUNTER — Other Ambulatory Visit: Payer: Self-pay | Admitting: Family Medicine

## 2022-06-10 ENCOUNTER — Ambulatory Visit: Payer: Medicare HMO | Admitting: Urology

## 2022-06-10 DIAGNOSIS — R972 Elevated prostate specific antigen [PSA]: Secondary | ICD-10-CM

## 2022-07-15 ENCOUNTER — Other Ambulatory Visit: Payer: Medicare HMO

## 2022-07-15 DIAGNOSIS — C61 Malignant neoplasm of prostate: Secondary | ICD-10-CM

## 2022-07-15 DIAGNOSIS — R972 Elevated prostate specific antigen [PSA]: Secondary | ICD-10-CM

## 2022-07-16 LAB — PSA: Prostate Specific Ag, Serum: 12.5 ng/mL — ABNORMAL HIGH (ref 0.0–4.0)

## 2022-07-21 ENCOUNTER — Ambulatory Visit (INDEPENDENT_AMBULATORY_CARE_PROVIDER_SITE_OTHER): Payer: Medicare HMO | Admitting: Family Medicine

## 2022-07-21 ENCOUNTER — Encounter: Payer: Self-pay | Admitting: Family Medicine

## 2022-07-21 VITALS — BP 160/90 | HR 74 | Resp 16 | Ht 74.0 in | Wt 272.1 lb

## 2022-07-21 DIAGNOSIS — M109 Gout, unspecified: Secondary | ICD-10-CM

## 2022-07-21 DIAGNOSIS — R3912 Poor urinary stream: Secondary | ICD-10-CM | POA: Diagnosis not present

## 2022-07-21 DIAGNOSIS — I4891 Unspecified atrial fibrillation: Secondary | ICD-10-CM | POA: Diagnosis not present

## 2022-07-21 DIAGNOSIS — I10 Essential (primary) hypertension: Secondary | ICD-10-CM

## 2022-07-21 DIAGNOSIS — R7303 Prediabetes: Secondary | ICD-10-CM | POA: Diagnosis not present

## 2022-07-21 MED ORDER — BENAZEPRIL HCL 40 MG PO TABS: 40.0000 mg | ORAL_TABLET | Freq: Every day | ORAL | 3 refills | Status: DC

## 2022-07-21 NOTE — Assessment & Plan Note (Signed)
Unchanged  Patient re-educated about  the importance of commitment to a  minimum of 150 minutes of exercise per week as able.  The importance of healthy food choices with portion control discussed, as well as eating regularly and within a 12 hour window most days. The need to choose "clean , green" food 50 to 75% of the time is discussed, as well as to make water the primary drink and set a goal of 64 ounces water daily.       07/21/2022    8:07 AM 01/21/2022    9:53 AM 12/13/2021    8:49 PM  Weight /BMI  Weight 272 lb 1.9 oz 271 lb 0.6 oz 261 lb 14.5 oz  Height '6\' 2"'$  (1.88 m) '6\' 1"'$  (1.854 m) '6\' 1"'$  (1.854 m)  BMI 34.94 kg/m2 35.76 kg/m2 34.55 kg/m2

## 2022-07-21 NOTE — Assessment & Plan Note (Signed)
Chronic and unchanged, will not take anticoagullant though aware of increased stroke risk

## 2022-07-21 NOTE — Assessment & Plan Note (Signed)
Improved on medication managed by Urology

## 2022-07-21 NOTE — Assessment & Plan Note (Signed)
Up[date uric acid level, no recent flares

## 2022-07-21 NOTE — Assessment & Plan Note (Signed)
Patient educated about the importance of limiting  Carbohydrate intake , the need to commit to daily physical activity for a minimum of 30 minutes , and to commit weight loss. The fact that changes in all these areas will reduce or eliminate all together the development of diabetes is stressed.      Latest Ref Rng & Units 01/21/2022   11:01 AM 01/21/2022   10:05 AM 12/15/2021    5:09 AM 12/14/2021    5:15 AM 12/13/2021    3:21 PM  Diabetic Labs  HbA1c 5.7 - 6.4 %  5.7      Chol 100 - 199 mg/dL 183       HDL >39 mg/dL 54       Calc LDL 0 - 99 mg/dL 109       Triglycerides 0 - 149 mg/dL 109       Creatinine 0.76 - 1.27 mg/dL 1.26   1.33  1.17  1.27       07/21/2022    8:27 AM 07/21/2022    8:07 AM 02/11/2022   10:02 AM 01/21/2022   10:38 AM 01/21/2022    9:53 AM 12/15/2021    1:25 PM 12/15/2021    4:38 AM  BP/Weight  Systolic BP 244 010 272 536 644 034 742  Diastolic BP 90 90 76 70 91 73 68  Wt. (Lbs)  272.12   271.04    BMI  34.94 kg/m2   35.76 kg/m2        05/20/2016    3:15 PM  Foot/eye exam completion dates  Foot Form Completion Done    Updated lab needed at/ before next visit.

## 2022-07-21 NOTE — Patient Instructions (Addendum)
F/U in 2 months re eval blood pressure  Resume benazepril 40 mg daily , blood pressure is high, continue amlodipine  Nurse please give info on hRTN at checkout  Chem 7 and eGFr, CBC and uric acid level today  It is important that you exercise regularly at least 30 minutes 5 times a week. If you develop chest pain, have severe difficulty breathing, or feel very tired, stop exercising immediately and seek medical attention  Think about what you will eat, plan ahead. Choose " clean, green, fresh or frozen" over canned, processed or packaged foods which are more sugary, salty and fatty. 70 to 75% of food eaten should be vegetables and fruit. Three meals at set times with snacks allowed between meals, but they must be fruit or vegetables. Aim to eat over a 12 hour period , example 7 am to 7 pm, and STOP after  your last meal of the day. Drink water,generally about 64 ounces per day, no other drink is as healthy. Fruit juice is best enjoyed in a healthy way, by EATING the fruit.   Thanks for choosing [facility], we consider it a privelige to serve you.  

## 2022-07-21 NOTE — Assessment & Plan Note (Signed)
,   needs to resume benazerpril, re eval in 2 months DASH diet and commitment to daily physical activity for a minimum of 30 minutes discussed and encouraged, as a part of hypertension management. The importance of attaining a healthy weight is also discussed.     07/21/2022    8:27 AM 07/21/2022    8:07 AM 02/11/2022   10:02 AM 01/21/2022   10:38 AM 01/21/2022    9:53 AM 12/15/2021    1:25 PM 12/15/2021    4:38 AM  BP/Weight  Systolic BP 514 604 799 872 158 727 618  Diastolic BP 90 90 76 70 91 73 68  Wt. (Lbs)  272.12   271.04    BMI  34.94 kg/m2   35.76 kg/m2

## 2022-07-21 NOTE — Progress Notes (Signed)
Carlos Leon     MRN: [id number]      DOB: 03/05/1946   HPI [name] is here for follow up and re-evaluation of chronic medical conditions, medication management and review of any available recent lab and radiology data.  has been off benazepril for over 1 month, stated I had d/c same which is not the case AND HI bp IS HIGH Feels well AND HAS NO CONCERNS EXCEPT IS AWARE OF THE NEED TO RECOMMIT TO REGULAR EXERCISE AND WEIGHT LOSS  ROS Denies recent fever or chills. Denies sinus pressure, nasal congestion, ear pain or sore throat. Denies chest congestion, productive cough or wheezing. Denies chest pains, palpitations and leg swelling Denies abdominal pain, nausea, vomiting,diarrhea or constipation.   Denies dysuria, frequency, hesitancy or incontinence. Denies uncontrolled  joint pain, swelling and limitation in mobility. Denies headaches, seizures, numbness, or tingling. Denies depression, anxiety or insomnia. Denies skin break down or rash.   PE   BP (!) 160/90   Pulse 74   Resp 16   Ht '6\' 2"'$  (1.88 m)   Wt 272 lb 1.9 oz (123.4 kg)   SpO2 97%   BMI 34.94 kg/m   Patient alert and oriented and in no cardiopulmonary distress.  HEENT: No facial asymmetry, EOMI,     Neck supple .  Chest: Clear to auscultation bilaterally.  CVS: S1, S2 no murmurs, no S3.irRegular rate.  ABD: Soft non tender.   Ext: No edema  MS: Adequate ROM spine, shoulders, hips and knees.  Skin: Intact, no ulcerations or rash noted.  Psych: Good eye contact, normal affect. Memory intact not anxious or depressed appearing.  CNS: CN 2-12 intact, power,  normal throughout.no focal deficits noted.   Assessment & Plan  Poor urinary stream Improved on medication managed by Urology  Prediabetes Patient educated about the importance of limiting  Carbohydrate intake , the need to commit to daily physical activity for a minimum of 30 minutes , and to commit weight loss. The fact that changes in  all these areas will reduce or eliminate all together the development of diabetes is stressed.      Latest Ref Rng & Units 01/21/2022   11:01 AM 01/21/2022   10:05 AM 12/15/2021    5:09 AM 12/14/2021    5:15 AM 12/13/2021    3:21 PM  Diabetic Labs  HbA1c 5.7 - 6.4 %  5.7      Chol 100 - 199 mg/dL 183       HDL >39 mg/dL 54       Calc LDL 0 - 99 mg/dL 109       Triglycerides 0 - 149 mg/dL 109       Creatinine 0.76 - 1.27 mg/dL 1.26   1.33  1.17  1.27       07/21/2022    8:27 AM 07/21/2022    8:07 AM 02/11/2022   10:02 AM 01/21/2022   10:38 AM 01/21/2022    9:53 AM 12/15/2021    1:25 PM 12/15/2021    4:38 AM  BP/Weight  Systolic BP 892 119 417 408 144 818 563  Diastolic BP 90 90 76 70 91 73 68  Wt. (Lbs)  272.12   271.04    BMI  34.94 kg/m2   35.76 kg/m2        05/20/2016    3:15 PM  Foot/eye exam completion dates  Foot Form Completion Done    Updated lab needed at/ before next visit.   Gout,  unspecified Up[date uric acid level, no recent flares  Essential hypertension, benign , needs to resume benazerpril, re eval in 2 months DASH diet and commitment to daily physical activity for a minimum of 30 minutes discussed and encouraged, as a part of hypertension management. The importance of attaining a healthy weight is also discussed.     07/21/2022    8:27 AM 07/21/2022    8:07 AM 02/11/2022   10:02 AM 01/21/2022   10:38 AM 01/21/2022    9:53 AM 12/15/2021    1:25 PM 12/15/2021    4:38 AM  BP/Weight  Systolic BP 656 812 751 700 174 944 967  Diastolic BP 90 90 76 70 91 73 68  Wt. (Lbs)  272.12   271.04    BMI  34.94 kg/m2   35.76 kg/m2         Atrial fibrillation ([facility]) Chronic and unchanged, will not take anticoagullant though aware of increased stroke risk  Morbid obesity ([place]) Unchanged  Patient re-educated about  the importance of commitment to a  minimum of 150 minutes of exercise per week as able.  The importance of healthy food choices with portion  control discussed, as well as eating regularly and within a 12 hour window most days. The need to choose "clean , green" food 50 to 75% of the time is discussed, as well as to make water the primary drink and set a goal of 64 ounces water daily.       07/21/2022    8:07 AM 01/21/2022    9:53 AM 12/13/2021    8:49 PM  Weight /BMI  Weight 272 lb 1.9 oz 271 lb 0.6 oz 261 lb 14.5 oz  Height '6\' 2"'$  (1.88 m) '6\' 1"'$  (1.854 m) '6\' 1"'$  (1.854 m)  BMI 34.94 kg/m2 35.76 kg/m2 34.55 kg/m2

## 2022-07-22 ENCOUNTER — Encounter: Payer: Self-pay | Admitting: Urology

## 2022-07-22 ENCOUNTER — Ambulatory Visit: Payer: Medicare HMO | Admitting: Urology

## 2022-07-22 VITALS — BP 149/88 | HR 82

## 2022-07-22 DIAGNOSIS — Z87440 Personal history of urinary (tract) infections: Secondary | ICD-10-CM

## 2022-07-22 DIAGNOSIS — N403 Nodular prostate with lower urinary tract symptoms: Secondary | ICD-10-CM | POA: Diagnosis not present

## 2022-07-22 DIAGNOSIS — R972 Elevated prostate specific antigen [PSA]: Secondary | ICD-10-CM | POA: Diagnosis not present

## 2022-07-22 DIAGNOSIS — C61 Malignant neoplasm of prostate: Secondary | ICD-10-CM

## 2022-07-22 DIAGNOSIS — N2889 Other specified disorders of kidney and ureter: Secondary | ICD-10-CM

## 2022-07-22 DIAGNOSIS — R339 Retention of urine, unspecified: Secondary | ICD-10-CM | POA: Diagnosis not present

## 2022-07-22 DIAGNOSIS — R351 Nocturia: Secondary | ICD-10-CM

## 2022-07-22 LAB — BMP8+EGFR
BUN/Creatinine Ratio: 11 (ref 10–24)
BUN: 18 mg/dL (ref 8–27)
CO2: 23 mmol/L (ref 20–29)
Calcium: 9.3 mg/dL (ref 8.6–10.2)
Chloride: 103 mmol/L (ref 96–106)
Creatinine, Ser: 1.61 mg/dL — ABNORMAL HIGH (ref 0.76–1.27)
Glucose: 123 mg/dL — ABNORMAL HIGH (ref 70–99)
Potassium: 5 mmol/L (ref 3.5–5.2)
Sodium: 139 mmol/L (ref 134–144)
eGFR: 44 mL/min/{1.73_m2} — ABNORMAL LOW (ref >59)

## 2022-07-22 LAB — CBC
Hematocrit: 42 % (ref 37.5–51.0)
Hemoglobin: 13.7 g/dL (ref 13.0–17.7)
MCH: 25.8 pg — ABNORMAL LOW (ref 26.6–33.0)
MCHC: 32.6 g/dL (ref 31.5–35.7)
MCV: 79 fL (ref 79–97)
Platelets: 185 10*3/uL (ref 150–450)
RBC: 5.31 x10E6/uL (ref 4.14–5.80)
RDW: 15.8 % — ABNORMAL HIGH (ref 11.6–15.4)
WBC: 7.5 10*3/uL (ref 3.4–10.8)

## 2022-07-22 LAB — URIC ACID: Uric Acid: 8.7 mg/dL — ABNORMAL HIGH (ref 3.8–8.4)

## 2022-07-22 LAB — BLADDER SCAN: Scan Result: 116

## 2022-07-22 MED ORDER — FINASTERIDE 5 MG PO TABS: 5.0000 mg | ORAL_TABLET | Freq: Every day | ORAL | 3 refills | Status: DC

## 2022-07-22 NOTE — Progress Notes (Signed)
Subjective: 1. 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 Nocturia   3. Urinary retention   4. Personal history of urinary infection   5. Prostate cancer ([place])   6. Elevated PSA   7. Renal mass   8. Other specified disorders of kidney and ureter        02/19/21: Carlos Leon is a 76 yo male who is sent by Dr. Moshe Cipro for an elevated PSA that was 12.8 on 11/19/19 and 16.1 on 11/24/20.   I saw him in 2015 for a PSA of 9.28 and recommended a biopsy.  I had seen him in 2013 as well with a PSA of 7.0 and recommended a biopsy.  His PSA in 2012 was 5.99.  He reports that he had a biopsy done at [facility] about 5 years ago.  The biopsy was negative.  He is voiding with moderate LUTS and an IPSS of 18/2.   He has nocturia x 3 and some frequency and urgency.  He has had no hematuria or dysuria.  I have reviewed the records and labs from his ER visit.   10/15/21: Carlos Leon returns today in f/u.  He was seen in the ER on 10/6 and was found to be in retention with 1017m PVR.  He has moderate LUTS with nocturia x3 and the retention was sudden in onset.  He has known BPH with a high PSA but negative prior biopsy.  HIs last PSA was 16.1 but he never returned for the MRI that I ordered.  He had >50 RBC's but not gross hematuria.  He returned to the ER the next day for gross hematuria and had to have the catheter irrigated.   A culture was negative but he was started on keflex and the UA had >50 WBC.  The urine is now clear for the past week.  He is on no BPH meds.   10/29/21: Carlos Leon today in f/u.  He had to return to the ER after his visit on 10/20 for foley replacement.  The PSA done on 10/15/21 was up to 25 from 16.  He had Klebsiella on a culture from 10/16/21.   He has completed the antibiotic he was given and is doing well with the foley now.   He remains on alfuzosin.  He hasn't had the CT hematuria study that I ordered.   11/05/21: Carlos Leon today in f/u.  His foley was  repositioned after the CT showed that the foley balloon was in the prostatic urethra and he came in earlier this week and had it repositioned.  He is have some leakage around the catheter but only if he gets an urge and tries to void.  He has no hematuria since the initial manipulation.  He remains on finasteride and alfuzosin.     11/26/21:   Carlos Leon today in f/u to discuss his prostate biopsy.  His Prostate volume was 2975mand he had a single core at the left base with 5% GG1 disease.   He remains on finasteride and has a foley in place for retention.   07/22/22: Carlos Leon today in f/u.   He stopped the alfuzosin and finasteride in early May and he is voiding well off of the meds. His IPSS is 3 with nocturia x 2.  His PSA is 12.5 prior to this visit.  He has a 29572mrostate with a history of UTI's with a PSA to 25 with an infection and  prior retention.   He has GG1 very low volume prostate cancer with a single core with 5% involvement.  His UA today is clear.  His Cr was up to 1.61 on 7/26.    He had a 1.8cm RUP renal mass on CT in 12/22 along with some dilation of a pancreatic duct and an MRI was recommended but I don't see that that has been done.  His PVR is 119m.    IPSS     Row Name 07/22/22 1300         International Prostate Symptom Score   How often have you had the sensation of not emptying your bladder? Not at All     How often have you had to urinate less than every two hours? Not at All     How often have you found you stopped and started again several times when you urinated? Not at All     How often have you found it difficult to postpone urination? Less than 1 in 5 times     How often have you had a weak urinary stream? Not at All     How often have you had to strain to start urination? Not at All     How many times did you typically get up at night to urinate? 2 Times     Total IPSS Score 3       Quality of Life due to urinary symptoms   If you were to  spend the rest of your life with your urinary condition just the way it is now how would you feel about that? Delighted               ROS:  ROS  Allergies  Allergen Reactions   Chlorthalidone Rash    Past Medical History:  Diagnosis Date   Arthritis    [facility]    Present by ECG 01/2012   Colon polyps    Diverticulosis of colon    Erectile dysfunction    Essential hypertension    Gout    Hyperlipidemia    Metabolic syndrome X 50/02/4741  Obesity     Past Surgical History:  Procedure Laterality Date   Colon polyps removed  2002   Dr. STamala Julian  COLONOSCOPY  2004   Dr. RMisty Stanleyprep, ACBE showed diverticulosis   COLONOSCOPY  11/24/2011   Procedure: COLONOSCOPY;  Surgeon: RDaneil Dolin MD;  [facility];  Service: Endoscopy;  Laterality: N/A;  9:10   COLONOSCOPY N/A 03/26/2020   Procedure: COLONOSCOPY;  Surgeon: RDaneil Dolin MD;  Location: [facility];  Service: Endoscopy;  Laterality: N/A;  9:30   HERNIA REPAIR     PROSTATE BIOPSY      Social History   Socioeconomic History   Marital status: Married    Spouse name: Not on file   Number of children: 4   Years of education: 12   Highest education level: 12th grade  Occupational History   Occupation: Retired  Tobacco Use   Smoking status: Former    Packs/day: 2.00    Years: 5.00    Total pack years: 10.00    Types: Cigarettes   Smokeless tobacco: Never  Vaping Use   Vaping Use: Never used  Substance and Sexual Activity   Alcohol use: No   Drug use: No   Sexual activity: Not on file  Other Topics Concern   Not on file  Social History Narrative  Lives with wife alone    Social Determinants of Health   Financial Resource Strain: Low Risk  (01/25/2022)   Overall Financial Resource Strain (CARDIA)    Difficulty of Paying Living Expenses: Not hard at all  Food Insecurity: No Food Insecurity (01/25/2022)   Hunger Vital Sign    Worried About Running Out of Food in  the Last Year: Never true    Ran Out of Food in the Last Year: Never true  Transportation Needs: No Transportation Needs (01/25/2022)   PRAPARE - Hydrologist (Medical): No    Lack of Transportation (Non-Medical): No  Physical Activity: Insufficiently Active (01/25/2022)   Exercise Vital Sign    Days of Exercise per Week: 5 days    Minutes of Exercise per Session: 20 min  Stress: No Stress Concern Present (01/25/2022)   [place]    Feeling of Stress : Not at all  Social Connections: [place] (01/25/2022)   Social Connection and Isolation Panel [NHANES]    Frequency of Communication with Friends and Family: Three times a week    Frequency of Social Gatherings with Friends and Family: Once a week    Attends Religious Services: More than 4 times per year    Active Member of [employer] or Organizations: Yes    Attends Music therapist: More than 4 times per year    Marital Status: Married  Human resources officer Violence: Not At Risk (01/25/2022)   Humiliation, Afraid, Rape, and Kick questionnaire    Fear of Current or Ex-Partner: No    Emotionally Abused: No    Physically Abused: No    Sexually Abused: No    Family History  Problem Relation Age of Onset   Hypertension Mother    Cancer Mother        Rectal, approximately 40   Diabetes Father    Alzheimer's disease Father    Liver disease Neg Hx    Colon cancer Neg Hx     Anti-infectives: Anti-infectives (From admission, onward)    None       Current Outpatient Medications  Medication Sig Dispense Refill   allopurinol (ZYLOPRIM) 300 MG tablet TAKE 1 TABLET EVERY DAY (Patient taking differently: Take 300 mg by mouth. 3 times a week) 90 tablet 1   amLODipine (NORVASC) 10 MG tablet TAKE 1 TABLET (10 MG TOTAL) BY MOUTH DAILY. 90 tablet 3   benazepril (LOTENSIN) 40 MG tablet Take 1 tablet (40 mg total) by mouth daily. 90  tablet 3   ergocalciferol (VITAMIN D2) 1.25 MG (50000 UT) capsule Take 1 capsule (50,000 Units total) by mouth once a week. One capsule once weekly 12 capsule 2   finasteride (PROSCAR) 5 MG tablet Take 1 tablet (5 mg total) by mouth daily. 90 tablet 3   No current facility-administered medications for this visit.     Objective: Vital signs in last 24 hours: BP (!) 149/88   Pulse 82   Intake/Output from previous day: No intake/output data recorded. Intake/Output this shift: @IOTHISSHIFT @   Physical Exam  Lab Results:  Results for orders placed or performed in visit on 07/22/22 (from the past 24 hour(s))  Urinalysis, Routine w reflex microscopic     Status: Abnormal   Collection Time: 07/22/22  3:03 PM  Result Value Ref Range   Specific Gravity, UA 1.015 1.005 - 1.030   pH, UA 5.5 5.0 - 7.5   Color, UA Yellow Yellow  Appearance Ur Clear Clear   Leukocytes,UA Negative Negative   Protein,UA 3+ (A) Negative/Trace   Glucose, UA Negative Negative   Ketones, UA Negative Negative   RBC, UA Trace (A) Negative   Bilirubin, UA Negative Negative   Urobilinogen, Ur 0.2 0.2 - 1.0 mg/dL   Nitrite, UA Negative Negative   Microscopic Examination See below:    Narrative   Performed at:  [facility] [address], [place], [place]  [id number] Lab Director: Mina Marble MT, Phone:  [phone]  Microscopic Examination     Status: None   Collection Time: 07/22/22  3:03 PM   Urine  Result Value Ref Range   WBC, UA None seen 0 - 5 /hpf   RBC, Urine 0-2 0 - 2 /hpf   Epithelial Cells (non renal) None seen 0 - 10 /hpf   Renal Epithel, UA None seen None seen /hpf   Mucus, UA Present Not Estab.   Bacteria, UA None seen None seen/Few   Narrative   Performed at:  [facility] [address] [place], [place]  [id number] Lab Director: Mina Marble MT, Phone:  [phone]      BMET Recent Results (from the past 2160 hour(s))  PSA     Status: Abnormal    Collection Time: 07/15/22 11:04 AM  Result Value Ref Range   Prostate Specific Ag, Serum 12.5 (H) 0.0 - 4.0 ng/mL    Comment: Roche ECLIA methodology. According to the American Urological Association, Serum PSA should decrease and remain at undetectable levels after radical prostatectomy. The AUA defines biochemical recurrence as an initial PSA value 0.2 ng/mL or greater followed by a subsequent confirmatory PSA value 0.2 ng/mL or greater. Values obtained with different assay methods or kits cannot be used interchangeably. Results cannot be interpreted as absolute evidence of the presence or absence of malignant disease.   BMP8+EGFR     Status: Abnormal   Collection Time: 07/21/22  8:41 AM  Result Value Ref Range   Glucose 123 (H) 70 - 99 mg/dL   BUN 18 8 - 27 mg/dL   Creatinine, Ser 1.61 (H) 0.76 - 1.27 mg/dL   eGFR 44 (L) >59 mL/min/1.73   BUN/Creatinine Ratio 11 10 - 24   Sodium 139 134 - 144 mmol/L   Potassium 5.0 3.5 - 5.2 mmol/L   Chloride 103 96 - 106 mmol/L   CO2 23 20 - 29 mmol/L   Calcium 9.3 8.6 - 10.2 mg/dL  CBC     Status: Abnormal   Collection Time: 07/21/22  8:41 AM  Result Value Ref Range   WBC 7.5 3.4 - 10.8 x10E3/uL   RBC 5.31 4.14 - 5.80 x10E6/uL   Hemoglobin 13.7 13.0 - 17.7 g/dL   Hematocrit 42.0 37.5 - 51.0 %   MCV 79 79 - 97 fL   MCH 25.8 (L) 26.6 - 33.0 pg   MCHC 32.6 31.5 - 35.7 g/dL   RDW 15.8 (H) 11.6 - 15.4 %   Platelets 185 150 - 450 x10E3/uL  Uric acid     Status: Abnormal   Collection Time: 07/21/22  8:41 AM  Result Value Ref Range   Uric Acid 8.7 (H) 3.8 - 8.4 mg/dL    Comment:            Therapeutic target for gout patients: <6.0  Bladder scan     Status: None   Collection Time: 07/22/22  1:50 PM  Result Value Ref Range   Scan Result  116   Urinalysis, Routine w reflex microscopic     Status: Abnormal   Collection Time: 07/22/22  3:03 PM  Result Value Ref Range   Specific Gravity, UA 1.015 1.005 - 1.030   pH, UA 5.5 5.0 - 7.5    Color, UA Yellow Yellow   Appearance Ur Clear Clear   Leukocytes,UA Negative Negative   Protein,UA 3+ (A) Negative/Trace   Glucose, UA Negative Negative   Ketones, UA Negative Negative   RBC, UA Trace (A) Negative   Bilirubin, UA Negative Negative   Urobilinogen, Ur 0.2 0.2 - 1.0 mg/dL   Nitrite, UA Negative Negative   Microscopic Examination See below:   Microscopic Examination     Status: None   Collection Time: 07/22/22  3:03 PM   Urine  Result Value Ref Range   WBC, UA None seen 0 - 5 /hpf   RBC, Urine 0-2 0 - 2 /hpf   Epithelial Cells (non renal) None seen 0 - 10 /hpf   Renal Epithel, UA None seen None seen /hpf   Mucus, UA Present Not Estab.   Bacteria, UA None seen None seen/Few    Studies/Results: No results found.    Path report reviewed.   Assessment/Plan: Nodular prostate with retention and elevated PSA and small volume GG1 prostate cancer in 1 core.   He is voiding better but has stopped the meds.  I think it is ok for him to stay off of the alfuzosin but I would like him to get back on the finasteride because it can prevent retention and potentially suppress the low grade prostate cancer.   Prostate cancer.  He will return in 6 months with a PSA.     Meds ordered this encounter  Medications   finasteride (PROSCAR) 5 MG tablet    Sig: Take 1 tablet (5 mg total) by mouth daily.    Dispense:  90 tablet    Refill:  3        Orders Placed This Encounter  Procedures   Microscopic Examination   Carlos ABDOMEN W WO CONTRAST    Standing Status:   Future    Standing Expiration Date:   07/23/2023    Order Specific Question:   If indicated for the ordered procedure, I authorize the administration of contrast media per Radiology protocol    Answer:   Yes    Order Specific Question:   What is the patient's sedation requirement?    Answer:   No Sedation    Order Specific Question:   Does the patient have a pacemaker or implanted devices?    Answer:   No    Order  Specific Question:   Radiology Contrast Protocol - do NOT remove file path    Answer:   [email or web address]    Order Specific Question:   Preferred imaging location?    Answer:   [facility] (table limit [phone])   Urinalysis, Routine w reflex microscopic   PSA    Standing Status:   Future    Standing Expiration Date:   07/23/2023   Bladder scan       Return in about 6 months (around 01/22/2023).    CC: Dr. Tula Nakayama.      Carlos Leon 07/23/2022 [phone] ID: Carlos Leon, male   DOB: 05/14/1946, 76 y.o.   MRN: [id number] Patient ID: Carlos Leon, male   DOB: May 14, 1946, 76 y.o.   MRN: [id number]

## 2022-07-22 NOTE — Progress Notes (Signed)
post void residual =138m

## 2022-07-23 LAB — MICROSCOPIC EXAMINATION
Bacteria, UA: NONE SEEN
Epithelial Cells (non renal): NONE SEEN /hpf (ref 0–10)
Renal Epithel, UA: NONE SEEN /hpf
WBC, UA: NONE SEEN /hpf (ref 0–5)

## 2022-07-23 LAB — URINALYSIS, ROUTINE W REFLEX MICROSCOPIC
Bilirubin, UA: NEGATIVE
Glucose, UA: NEGATIVE
Ketones, UA: NEGATIVE
Leukocytes,UA: NEGATIVE
Nitrite, UA: NEGATIVE
Specific Gravity, UA: 1.015 (ref 1.005–1.030)
Urobilinogen, Ur: 0.2 mg/dL (ref 0.2–1.0)
pH, UA: 5.5 (ref 5.0–7.5)

## 2022-07-29 ENCOUNTER — Ambulatory Visit (INDEPENDENT_AMBULATORY_CARE_PROVIDER_SITE_OTHER): Payer: Medicare HMO | Admitting: *Deleted

## 2022-07-29 DIAGNOSIS — E1122 Type 2 diabetes mellitus with diabetic chronic kidney disease: Secondary | ICD-10-CM | POA: Diagnosis not present

## 2022-07-29 DIAGNOSIS — E785 Hyperlipidemia, unspecified: Secondary | ICD-10-CM

## 2022-07-29 DIAGNOSIS — I10 Essential (primary) hypertension: Secondary | ICD-10-CM

## 2022-07-29 NOTE — Patient Instructions (Signed)
Visit Information  Thank you for taking time to visit with me today. Please don't hesitate to contact me if I can be of assistance to you before our next scheduled telephone appointment.  Following are the goals we discussed today:  Take medications as prescribed   Attend all scheduled provider appointments Call pharmacy for medication refills 3-7 days in advance of running out of medications Perform all self care activities independently  Perform IADL's (shopping, preparing meals, housekeeping, managing finances) independently Call provider office for new concerns or questions  check blood pressure daily keep a blood pressure log take blood pressure log to all doctor appointments keep all doctor appointments take medications for blood pressure exactly as prescribed begin an exercise program report new symptoms to your doctor - call for medicine refill 2 or 3 days before it runs out - call doctor with any symptoms you believe are related to your medicine - adhere to prescribed diet: heart healthy, low sodium Drink adequate fluids (preferably water) Practice good handwashing Try to do some type of exercise daily Case closure today, please let your doctor know if you have any needs in the future for case management   Please call the care guide team at [phone] if you need to cancel or reschedule your appointment.   If you are experiencing a Mental Health or [place] or need someone to talk to, please call the Suicide and Crisis Lifeline: 988 call the [place] National Suicide Prevention Lifeline: (215)773-2225 or TTY: 913-722-6307 TTY 816-748-4894) to talk to a trained counselor call 1-800-273-TALK (toll free, 24 hour hotline) go to [facility] [address] [place] [phone]) call the [place]: [phone] call 911   Patient verbalizes understanding of instructions and care plan provided today and  agrees to view in [place]. Active MyChart status and patient understanding of how to access instructions and care plan via MyChart confirmed with patient.     Jacqlyn Larsen [facility], BSN RN Case Manager [facility] [phone]

## 2022-07-29 NOTE — Chronic Care Management (AMB) (Signed)
Chronic Care Management   CCM RN Visit Note  07/29/2022 Name: Carlos Leon MRN: [id number] DOB: 06-08-1946  Subjective: Carlos Leon is a 76 y.o. year old male who is a primary care patient of [name], MD. The care management team was consulted for assistance with disease management and care coordination needs.    Engaged with patient by telephone for follow up visit in response to provider referral for case management and/or care coordination services.   Consent to Services:  The patient was given information about Chronic Care Management services, agreed to services, and gave verbal consent prior to initiation of services.  Please see initial visit note for detailed documentation.   Patient agreed to services and verbal consent obtained.   Assessment: Review of patient past medical history, allergies, medications, health status, including review of consultants reports, laboratory and other test data, was performed as part of comprehensive evaluation and provision of chronic care management services.   SDOH (Social Determinants of Health) assessments and interventions performed:    CCM Care Plan  Allergies  Allergen Reactions   Chlorthalidone Rash    Outpatient Encounter Medications as of 07/29/2022  Medication Sig   allopurinol (ZYLOPRIM) 300 MG tablet TAKE 1 TABLET EVERY DAY (Patient taking differently: Take 300 mg by mouth. 3 times a week)   amLODipine (NORVASC) 10 MG tablet TAKE 1 TABLET (10 MG TOTAL) BY MOUTH DAILY.   benazepril (LOTENSIN) 40 MG tablet Take 1 tablet (40 mg total) by mouth daily.   ergocalciferol (VITAMIN D2) 1.25 MG (50000 UT) capsule Take 1 capsule (50,000 Units total) by mouth once a week. One capsule once weekly   finasteride (PROSCAR) 5 MG tablet Take 1 tablet (5 mg total) by mouth daily.   No facility-administered encounter medications on file as of 07/29/2022.    Patient Active Problem List   Diagnosis Date Noted   Anemia 01/21/2022    Other B-complex deficiencies 01/21/2022   Pure hypercholesterolemia 01/21/2022   Sickle cell trait ([place]) 01/21/2022   Type 2 or unspecified type diabetes mellitus 01/21/2022   CKD (chronic kidney disease) stage 3, GFR 30-59 ml/min (HCC) 12/14/2021   Unspecified atrial fibrillation ([place]) 12/14/2021   Right kidney mass 12/13/2021   Dilated pancreatic duct 12/13/2021   Encounter for Medicare annual examination with abnormal findings 11/25/2020   Refused H1N1 influenza virus immunization 09/17/2020   Hearing loss 11/25/2019   IDA (iron deficiency anemia) 11/16/2018   Poor urinary stream 09/30/2018   Asymptomatic proteinuria 08/08/2013   Elevated PSA 11/19/2012   Atrial fibrillation ([place]) 02/07/2012   Tubular adenoma of colon 10/27/2011   Hyperlipemia 08/26/2009   Morbid obesity ([place]) 04/18/2009   Prediabetes 10/21/2008   Gout, unspecified 10/21/2008   Essential hypertension, benign 10/21/2008    Conditions to be addressed/monitored:HTN and HLD  Care Plan : RN Care Manager Plan of Care  Updates made by [name], RN since 07/29/2022 12:00 AM     Problem: No plan of care established for management of chronic disease states  (HTN, HLD)   Priority: High     Long-Range Goal: Development of plan of care for chronic disease management (HTN, HLD)   Start Date: 12/03/2021  Expected End Date: 06/01/2022  Priority: High  Note:   Current Barriers:  Knowledge Deficits related to plan of care for management of HTN and HLD  Patient lives with wife, is independent in all aspects of his care, remains active in the community, plays golf regularly, attends church weekly, does  not always eat heart healthy but is trying, eats fast food at times, reports has all medications and taking as prescribed. Reports no longer has foley catheter , continues to follow up with urology as needed. Pt reports he now checks blood pressure daily and "readings are good". ASCVD risk enhancing conditions: age >66,  pre-diabetes, HTN No new concerns reported today  RNCM Clinical Goal(s):  Patient will verbalize understanding of plan for management of HTN and HLD as evidenced by pt report, review of EHR and  through collaboration with RN Care manager, provider, and care team.   Interventions: 1:1 collaboration with primary care provider regarding development and update of comprehensive plan of care as evidenced by provider attestation and co-signature Inter-disciplinary care team collaboration (see longitudinal plan of care) Evaluation of current treatment plan related to  self management and patient's adherence to plan as established by provider   Hyperlipidemia:  (Status: Goal on Track (progressing): YES.) Long Term Goal  Lab Results  Component Value Date   CHOL 177 07/06/2021   HDL 55 07/06/2021   LDLCALC 109 (H) 07/06/2021   LDLDIRECT 125 05/20/2016   TRIG 68 07/06/2021   CHOLHDL 3.2 07/06/2021     Medication review performed; medication list updated in electronic medical record.  Counseled on importance of regular laboratory monitoring as prescribed; Reviewed role and benefits of statin for ASCVD risk reduction; Reviewed importance of limiting foods high in cholesterol; Reviewed exercise goals and target of 150 minutes per week; Encouraged to look over and complete advanced directives Reviewed all upcoming scheduled appointments Reviewed plan of care with patient including case closure today   Hypertension Interventions:  (Status:  Goal on track:  Yes.) Long Term Goal Last practice recorded BP readings:  BP Readings from Last 3 Encounters:  11/12/21 (!) 147/95  11/05/21 (!) 143/79  10/29/21 (!) 149/79  Most recent eGFR/CrCl:  Lab Results  Component Value Date   EGFR 46 (L) 10/15/2021    No components found for: CRCL  Reviewed medications with patient and discussed importance of compliance Counseled on the importance of exercise goals with target of 150 minutes per week Advised  patient, providing education and rationale, to monitor blood pressure daily and record, calling PCP for findings outside established parameters Discussed complications of poorly controlled blood pressure such as heart disease, stroke, circulatory complications, vision complications, kidney impairment, sexual dysfunction  Reinforced low sodium diet and limiting fast food Reviewed importance of drinking adequate fluids (preferably water)   Patient Goals/Self-Care Activities: Take medications as prescribed   Attend all scheduled provider appointments Call pharmacy for medication refills 3-7 days in advance of running out of medications Perform all self care activities independently  Perform IADL's (shopping, preparing meals, housekeeping, managing finances) independently Call provider office for new concerns or questions  check blood pressure daily keep a blood pressure log take blood pressure log to all doctor appointments keep all doctor appointments take medications for blood pressure exactly as prescribed begin an exercise program report new symptoms to your doctor - call for medicine refill 2 or 3 days before it runs out - call doctor with any symptoms you believe are related to your medicine - adhere to prescribed diet: heart healthy, low sodium Drink adequate fluids (preferably water) Practice good handwashing Try to do some type of exercise daily Case closure today, please let your doctor know if you have any needs in the future for case management       Plan:No further follow up required: case  closure today  Jacqlyn Larsen [facility], BSN RN Case Advertising copywriter Primary Care [phone]

## 2022-08-10 ENCOUNTER — Ambulatory Visit (HOSPITAL_COMMUNITY)
Admission: RE | Admit: 2022-08-10 | Discharge: 2022-08-10 | Disposition: A | Discharge status: Home / Self Care | Payer: Medicare HMO | Source: Ambulatory Visit | Attending: Urology | Admitting: Urology

## 2022-08-10 ENCOUNTER — Encounter (HOSPITAL_COMMUNITY): Payer: Self-pay

## 2022-08-10 DIAGNOSIS — N2889 Other specified disorders of kidney and ureter: Secondary | ICD-10-CM

## 2022-08-20 ENCOUNTER — Other Ambulatory Visit: Payer: Self-pay | Admitting: Urology

## 2022-08-20 DIAGNOSIS — N403 Nodular prostate with lower urinary tract symptoms: Secondary | ICD-10-CM

## 2022-08-26 DIAGNOSIS — E785 Hyperlipidemia, unspecified: Secondary | ICD-10-CM | POA: Diagnosis not present

## 2022-08-26 DIAGNOSIS — I10 Essential (primary) hypertension: Secondary | ICD-10-CM

## 2022-09-28 ENCOUNTER — Ambulatory Visit (INDEPENDENT_AMBULATORY_CARE_PROVIDER_SITE_OTHER): Payer: Medicare HMO | Admitting: Family Medicine

## 2022-09-28 ENCOUNTER — Encounter: Payer: Self-pay | Admitting: Family Medicine

## 2022-09-28 VITALS — BP 128/70 | HR 68 | Ht 74.0 in | Wt 277.1 lb

## 2022-09-28 DIAGNOSIS — H9190 Unspecified hearing loss, unspecified ear: Secondary | ICD-10-CM | POA: Diagnosis not present

## 2022-09-28 DIAGNOSIS — E785 Hyperlipidemia, unspecified: Secondary | ICD-10-CM

## 2022-09-28 DIAGNOSIS — R7303 Prediabetes: Secondary | ICD-10-CM | POA: Diagnosis not present

## 2022-09-28 DIAGNOSIS — H9312 Tinnitus, left ear: Secondary | ICD-10-CM

## 2022-09-28 DIAGNOSIS — M109 Gout, unspecified: Secondary | ICD-10-CM | POA: Diagnosis not present

## 2022-09-28 DIAGNOSIS — I10 Essential (primary) hypertension: Secondary | ICD-10-CM | POA: Diagnosis not present

## 2022-09-28 DIAGNOSIS — E559 Vitamin D deficiency, unspecified: Secondary | ICD-10-CM

## 2022-09-28 LAB — POCT GLYCOSYLATED HEMOGLOBIN (HGB A1C): HbA1c, POC (controlled diabetic range): 6 % (ref 0.0–7.0)

## 2022-09-28 NOTE — Patient Instructions (Addendum)
Annual exam Jan 27 or after  Flush left ear in office  glycoHb in office today if 6.5 or more needs microalb today also  Fasting lipid, cmp and eGFR, cBC and Uric acid AN D VIT D 1 WEEK BEFORE jAN APPT  YOU ARE REFERRED TO ENT RE LEFT EAR SYMPTOMS  PLEASE START SWIMMING AND INCREASE PLANT BASED DIET  RECONSIDER VACCINES  CUT BACK ON  ICE CREAM, YOU ARE UP [place]  Thanks for choosing [facility], we consider it a privelige to serve you.

## 2022-10-02 ENCOUNTER — Other Ambulatory Visit: Payer: Self-pay | Admitting: Family Medicine

## 2022-10-04 ENCOUNTER — Encounter: Payer: Self-pay | Admitting: Family Medicine

## 2022-10-04 NOTE — Assessment & Plan Note (Signed)
  Patient re-educated about  the importance of commitment to a  minimum of 150 minutes of exercise per week as able.  The importance of healthy food choices with portion control discussed, as well as eating regularly and within a 12 hour window most days. The need to choose "clean , green" food 50 to 75% of the time is discussed, as well as to make water the primary drink and set a goal of 64 ounces water daily.       09/28/2022    8:52 AM 07/21/2022    8:07 AM 01/21/2022    9:53 AM  Weight /BMI  Weight 277 lb 1.9 oz 272 lb 1.9 oz 271 lb 0.6 oz  Height '6\' 2"'$  (1.88 m) '6\' 2"'$  (1.88 m) '6\' 1"'$  (1.854 m)  BMI 35.58 kg/m2 34.94 kg/m2 35.76 kg/m2

## 2022-10-04 NOTE — Assessment & Plan Note (Signed)
Patient educated about the importance of limiting  Carbohydrate intake , the need to commit to daily physical activity for a minimum of 30 minutes , and to commit weight loss. The fact that changes in all these areas will reduce or eliminate all together the development of diabetes is stressed.      Latest Ref Rng & Units 09/28/2022    9:39 AM 07/21/2022    8:41 AM 01/21/2022   11:01 AM 01/21/2022   10:05 AM 12/15/2021    5:09 AM  Diabetic Labs  HbA1c 0.0 - 7.0 % 6.0    5.7    Chol 100 - 199 mg/dL   183     HDL >39 mg/dL   54     Calc LDL 0 - 99 mg/dL   109     Triglycerides 0 - 149 mg/dL   109     Creatinine 0.76 - 1.27 mg/dL  1.61  1.26   1.33       09/28/2022    9:18 AM 09/28/2022    8:53 AM 09/28/2022    8:52 AM 07/22/2022    1:29 PM 07/21/2022    8:27 AM 07/21/2022    8:07 AM 02/11/2022   10:02 AM  BP/Weight  Systolic BP 956 387 564 332 951 884 166  Diastolic BP 70 72 79 88 90 90 76  Wt. (Lbs)   277.12   272.12   BMI   35.58 kg/m2   34.94 kg/m2       05/20/2016    3:15 PM  Foot/eye exam completion dates  Foot Form Completion Done

## 2022-10-04 NOTE — Progress Notes (Signed)
Carlos Leon     MRN: [id number]      DOB: 1946-03-26   HPI Mr. Splitt is here for follow up and re-evaluation of chronic medical conditions, medication management and review of any available recent lab and radiology data.  Preventive health is updated, specifically  Cancer screening and Immunization.   Questions or concerns regarding consultations or procedures which the PT has had in the interim are  addressed. The PT denies any adverse reactions to current medications since the last visit.  C/o pressure , pain and buzzing in left ar with reduced hearing   ROS Denies recent fever or chills. Denies sinus pressure, nasal congestion, sore throat. Denies chest congestion, productive cough or wheezing. Denies chest pains, palpitations and leg swelling Denies abdominal pain, nausea, vomiting,diarrhea or constipation.   Denies dysuria, frequency, hesitancy or incontinence. Denies joint pain, swelling and limitation in mobility. Denies headaches, seizures, numbness, or tingling. Denies depression, anxiety or insomnia. Denies skin break down or rash.   PE  BP 128/70   Pulse 68   Ht '6\' 2"'$  (1.88 m)   Wt 277 lb 1.9 oz (125.7 kg)   SpO2 97%   BMI 35.58 kg/m   Patient alert and oriented and in no cardiopulmonary distress.  HEENT: No facial asymmetry, EOMI,     Neck supple Left ear impacted with cerumen, unable to flush in office   Chest: Clear to auscultation bilaterally.  CVS: S1, S2 no murmurs, no S3.IrRegular rate.  ABD: Soft non tender.   Ext: No edema  MS: Adequate ROM spine, shoulders, hips and knees.  Skin: Intact, no ulcerations or rash noted.  Psych: Good eye contact, normal affect. Memory intact not anxious or depressed appearing.  CNS: CN 2-12 intact, power,  normal throughout.no focal deficits noted.   Assessment & Plan  Essential hypertension, benign Controlled, no change in medication DASH diet and commitment to daily physical activity for a minimum  of 30 minutes discussed and encouraged, as a part of hypertension management. The importance of attaining a healthy weight is also discussed.     09/28/2022    9:18 AM 09/28/2022    8:53 AM 09/28/2022    8:52 AM 07/22/2022    1:29 PM 07/21/2022    8:27 AM 07/21/2022    8:07 AM 02/11/2022   10:02 AM  BP/Weight  Systolic BP 709 628 366 294 765 465 035  Diastolic BP 70 72 79 88 90 90 76  Wt. (Lbs)   277.12   272.12   BMI   35.58 kg/m2   34.94 kg/m2        Morbid obesity ([place])  Patient re-educated about  the importance of commitment to a  minimum of 150 minutes of exercise per week as able.  The importance of healthy food choices with portion control discussed, as well as eating regularly and within a 12 hour window most days. The need to choose "clean , green" food 50 to 75% of the time is discussed, as well as to make water the primary drink and set a goal of 64 ounces water daily.       09/28/2022    8:52 AM 07/21/2022    8:07 AM 01/21/2022    9:53 AM  Weight /BMI  Weight 277 lb 1.9 oz 272 lb 1.9 oz 271 lb 0.6 oz  Height '6\' 2"'$  (1.88 m) '6\' 2"'$  (1.88 m) '6\' 1"'$  (1.854 m)  BMI 35.58 kg/m2 34.94 kg/m2 35.76 kg/m2  Prediabetes Patient educated about the importance of limiting  Carbohydrate intake , the need to commit to daily physical activity for a minimum of 30 minutes , and to commit weight loss. The fact that changes in all these areas will reduce or eliminate all together the development of diabetes is stressed.      Latest Ref Rng & Units 09/28/2022    9:39 AM 07/21/2022    8:41 AM 01/21/2022   11:01 AM 01/21/2022   10:05 AM 12/15/2021    5:09 AM  Diabetic Labs  HbA1c 0.0 - 7.0 % 6.0    5.7    Chol 100 - 199 mg/dL   183     HDL >39 mg/dL   54     Calc LDL 0 - 99 mg/dL   109     Triglycerides 0 - 149 mg/dL   109     Creatinine 0.76 - 1.27 mg/dL  1.61  1.26   1.33       09/28/2022    9:18 AM 09/28/2022    8:53 AM 09/28/2022    8:52 AM 07/22/2022    1:29 PM 07/21/2022     8:27 AM 07/21/2022    8:07 AM 02/11/2022   10:02 AM  BP/Weight  Systolic BP 021 115 520 802 233 612 244  Diastolic BP 70 72 79 88 90 90 76  Wt. (Lbs)   277.12   272.12   BMI   35.58 kg/m2   34.94 kg/m2       05/20/2016    3:15 PM  Foot/eye exam completion dates  Foot Form Completion Done

## 2022-10-04 NOTE — Assessment & Plan Note (Signed)
Controlled, no change in medication DASH diet and commitment to daily physical activity for a minimum of 30 minutes discussed and encouraged, as a part of hypertension management. The importance of attaining a healthy weight is also discussed.     09/28/2022    9:18 AM 09/28/2022    8:53 AM 09/28/2022    8:52 AM 07/22/2022    1:29 PM 07/21/2022    8:27 AM 07/21/2022    8:07 AM 02/11/2022   10:02 AM  BP/Weight  Systolic BP 673 419 379 024 097 353 299  Diastolic BP 70 72 79 88 90 90 76  Wt. (Lbs)   277.12   272.12   BMI   35.58 kg/m2   34.94 kg/m2

## 2022-10-21 DIAGNOSIS — H903 Sensorineural hearing loss, bilateral: Secondary | ICD-10-CM | POA: Diagnosis not present

## 2022-10-21 DIAGNOSIS — H9312 Tinnitus, left ear: Secondary | ICD-10-CM | POA: Diagnosis not present

## 2022-11-23 DIAGNOSIS — H2513 Age-related nuclear cataract, bilateral: Secondary | ICD-10-CM | POA: Diagnosis not present

## 2022-11-23 DIAGNOSIS — H52223 Regular astigmatism, bilateral: Secondary | ICD-10-CM | POA: Diagnosis not present

## 2022-11-23 DIAGNOSIS — H40013 Open angle with borderline findings, low risk, bilateral: Secondary | ICD-10-CM | POA: Diagnosis not present

## 2022-11-23 DIAGNOSIS — H524 Presbyopia: Secondary | ICD-10-CM | POA: Diagnosis not present

## 2022-12-22 DIAGNOSIS — H401130 Primary open-angle glaucoma, bilateral, stage unspecified: Secondary | ICD-10-CM | POA: Diagnosis not present

## 2022-12-22 DIAGNOSIS — H35412 Lattice degeneration of retina, left eye: Secondary | ICD-10-CM | POA: Diagnosis not present

## 2022-12-22 DIAGNOSIS — H11153 Pinguecula, bilateral: Secondary | ICD-10-CM | POA: Diagnosis not present

## 2022-12-22 DIAGNOSIS — H25093 Other age-related incipient cataract, bilateral: Secondary | ICD-10-CM | POA: Diagnosis not present

## 2023-01-20 ENCOUNTER — Ambulatory Visit: Payer: Medicare HMO | Admitting: Urology

## 2023-01-24 ENCOUNTER — Encounter: Payer: Medicare HMO | Admitting: Family Medicine

## 2023-01-25 ENCOUNTER — Encounter: Payer: Self-pay | Admitting: Family Medicine

## 2023-01-25 ENCOUNTER — Ambulatory Visit (INDEPENDENT_AMBULATORY_CARE_PROVIDER_SITE_OTHER): Payer: Medicare HMO | Admitting: Family Medicine

## 2023-01-25 VITALS — BP 140/78 | HR 71 | Ht 74.0 in | Wt 279.1 lb

## 2023-01-25 DIAGNOSIS — I4811 Longstanding persistent atrial fibrillation: Secondary | ICD-10-CM | POA: Diagnosis not present

## 2023-01-25 DIAGNOSIS — I10 Essential (primary) hypertension: Secondary | ICD-10-CM

## 2023-01-25 DIAGNOSIS — E785 Hyperlipidemia, unspecified: Secondary | ICD-10-CM | POA: Diagnosis not present

## 2023-01-25 DIAGNOSIS — Z0001 Encounter for general adult medical examination with abnormal findings: Secondary | ICD-10-CM

## 2023-01-25 DIAGNOSIS — Z2821 Immunization not carried out because of patient refusal: Secondary | ICD-10-CM | POA: Diagnosis not present

## 2023-01-25 DIAGNOSIS — M109 Gout, unspecified: Secondary | ICD-10-CM | POA: Diagnosis not present

## 2023-01-25 DIAGNOSIS — E559 Vitamin D deficiency, unspecified: Secondary | ICD-10-CM | POA: Diagnosis not present

## 2023-01-25 DIAGNOSIS — R7303 Prediabetes: Secondary | ICD-10-CM

## 2023-01-25 NOTE — Assessment & Plan Note (Signed)
  Patient re-educated about  the importance of commitment to a  minimum of 150 minutes of exercise per week as able.  The importance of healthy food choices with portion control discussed, as well as eating regularly and within a 12 hour window most days. The need to choose "clean , green" food 50 to 75% of the time is discussed, as well as to make water the primary drink and set a goal of 64 ounces water daily.       01/25/2023    8:28 AM 09/28/2022    8:52 AM 07/21/2022    8:07 AM  Weight /BMI  Weight 279 lb 1.9 oz 277 lb 1.9 oz 272 lb 1.9 oz  Height '6\' 2"'$  (1.88 m) '6\' 2"'$  (1.88 m) '6\' 2"'$  (1.88 m)  BMI 35.84 kg/m2 35.58 kg/m2 34.94 kg/m2

## 2023-01-25 NOTE — Assessment & Plan Note (Signed)
Patient educated about the importance of limiting  Carbohydrate intake , the need to commit to daily physical activity for a minimum of 30 minutes , and to commit weight loss. The fact that changes in all these areas will reduce or eliminate all together the development of diabetes is stressed.      Latest Ref Rng & Units 09/28/2022    9:39 AM 07/21/2022    8:41 AM 01/21/2022   11:01 AM 01/21/2022   10:05 AM 12/15/2021    5:09 AM  Diabetic Labs  HbA1c 0.0 - 7.0 % 6.0    5.7    Chol 100 - 199 mg/dL   183     HDL >39 mg/dL   54     Calc LDL 0 - 99 mg/dL   109     Triglycerides 0 - 149 mg/dL   109     Creatinine 0.76 - 1.27 mg/dL  1.61  1.26   1.33       01/25/2023    9:07 AM 01/25/2023    8:30 AM 01/25/2023    8:28 AM 09/28/2022    9:18 AM 09/28/2022    8:53 AM 09/28/2022    8:52 AM 07/22/2022    1:29 PM  BP/Weight  Systolic BP 750 518 335 825 189 842 103  Diastolic BP 78 80 77 70 72 79 88  Wt. (Lbs)   279.12   277.12   BMI   35.84 kg/m2   35.58 kg/m2       05/20/2016    3:15 PM  Foot/eye exam completion dates  Foot Form Completion Done

## 2023-01-25 NOTE — Assessment & Plan Note (Signed)
EKG at visit, remains in a fib, though rate is controlled, past ischemia on eKG, now agreeing to anti coagulation, refer to Cardiology

## 2023-01-25 NOTE — Assessment & Plan Note (Signed)
Not at goal, will readdress after Cardiology eval DASH diet and commitment to daily physical activity for a minimum of 30 minutes discussed and encouraged, as a part of hypertension management. The importance of attaining a healthy weight is also discussed.     01/25/2023    9:07 AM 01/25/2023    8:30 AM 01/25/2023    8:28 AM 09/28/2022    9:18 AM 09/28/2022    8:53 AM 09/28/2022    8:52 AM 07/22/2022    1:29 PM  BP/Weight  Systolic BP 929 574 734 037 096 438 381  Diastolic BP 78 80 77 70 72 79 88  Wt. (Lbs)   279.12   277.12   BMI   35.84 kg/m2   35.58 kg/m2

## 2023-01-25 NOTE — Assessment & Plan Note (Signed)
Offered at visit still refusing

## 2023-01-25 NOTE — Progress Notes (Signed)
Carlos Leon     MRN: [id number]      DOB: March 31, 1946   HPI: Patient is in for annual physical exam. A fibb, and elevated PSA are addressedImmunization is reviewed , and  updated if needed.    PE; BP (!) 140/78   Pulse 71   Ht '6\' 2"'$  (1.88 m)   Wt 279 lb 1.9 oz (126.6 kg)   SpO2 97%   BMI 35.84 kg/m   Pleasant male, alert and oriented x 3, in no cardio-pulmonary distress. Afebrile. HEENT No facial trauma or asymetry. Sinuses non tender. EOMI External ears normal,  Neck: supple, no adenopathy,JVD or thyromegaly.No bruits.  Chest: Clear to ascultation bilaterally.No crackles or wheezes. Non tender to palpation  Cardiovascular system; Heart sounds normal,  S1 and  S2 ,no S3.  No murmur, or thrill.Irregularly , irregular pulse Apical beat not displaced Peripheral pulses normal.  Abdomen: Soft, non tender, no organomegaly or masses. No bruits. Bowel sounds normal. No guarding, tenderness or rebound.    Musculoskeletal exam: Full ROM of spine, hips , shoulders and reduced in left knee Mild  deformity ,swelling and or crepitus noted.of left knee No muscle wasting or atrophy.   Neurologic: Cranial nerves 2 to 12 intact. Power, tone ,sensation  normal throughout. No disturbance in gait. No tremor.  Skin: Intact, no ulceration, erythema , scaling or rash noted. Pigmentation normal throughout  Psych; Normal mood and affect. Judgement and concentration normal   Assessment & Plan:  Encounter for Medicare annual examination with abnormal findings Annual exam as documented. Counseling done  re healthy lifestyle involving commitment to 150 minutes exercise per week, heart healthy diet, and attaining healthy weight.The importance of adequate sleep also discussed. Regular seat belt use and home safety, is also discussed. Changes in health habits are decided on by the patient with goals and time frames  set for achieving them. Immunization and cancer screening  needs are specifically addressed at this visit.Does not want any immunization   Morbid obesity ([place])  Patient re-educated about  the importance of commitment to a  minimum of 150 minutes of exercise per week as able.  The importance of healthy food choices with portion control discussed, as well as eating regularly and within a 12 hour window most days. The need to choose "clean , green" food 50 to 75% of the time is discussed, as well as to make water the primary drink and set a goal of 64 ounces water daily.       01/25/2023    8:28 AM 09/28/2022    8:52 AM 07/21/2022    8:07 AM  Weight /BMI  Weight 279 lb 1.9 oz 277 lb 1.9 oz 272 lb 1.9 oz  Height '6\' 2"'$  (1.88 m) '6\' 2"'$  (1.88 m) '6\' 2"'$  (1.88 m)  BMI 35.84 kg/m2 35.58 kg/m2 34.94 kg/m2      Refused H1N1 influenza virus immunization Offered at visit still refusing  Atrial fibrillation ([place]) EKG at visit, remains in a fib, though rate is controlled, past ischemia on eKG, now agreeing to anti coagulation, refer to Cardiology  Essential hypertension, benign Not at goal, will readdress after Cardiology eval DASH diet and commitment to daily physical activity for a minimum of 30 minutes discussed and encouraged, as a part of hypertension management. The importance of attaining a healthy weight is also discussed.     01/25/2023    9:07 AM 01/25/2023    8:30 AM 01/25/2023    8:28 AM 09/28/2022  9:18 AM 09/28/2022    8:53 AM 09/28/2022    8:52 AM 07/22/2022    1:29 PM  BP/Weight  Systolic BP 308 657 846 962 952 841 324  Diastolic BP 78 80 77 70 72 79 88  Wt. (Lbs)   279.12   277.12   BMI   35.84 kg/m2   35.58 kg/m2        Prediabetes Patient educated about the importance of limiting  Carbohydrate intake , the need to commit to daily physical activity for a minimum of 30 minutes , and to commit weight loss. The fact that changes in all these areas will reduce or eliminate all together the development of diabetes is stressed.       Latest Ref Rng & Units 09/28/2022    9:39 AM 07/21/2022    8:41 AM 01/21/2022   11:01 AM 01/21/2022   10:05 AM 12/15/2021    5:09 AM  Diabetic Labs  HbA1c 0.0 - 7.0 % 6.0    5.7    Chol 100 - 199 mg/dL   183     HDL >39 mg/dL   54     Calc LDL 0 - 99 mg/dL   109     Triglycerides 0 - 149 mg/dL   109     Creatinine 0.76 - 1.27 mg/dL  1.61  1.26   1.33       01/25/2023    9:07 AM 01/25/2023    8:30 AM 01/25/2023    8:28 AM 09/28/2022    9:18 AM 09/28/2022    8:53 AM 09/28/2022    8:52 AM 07/22/2022    1:29 PM  BP/Weight  Systolic BP 401 027 253 664 403 474 259  Diastolic BP 78 80 77 70 72 79 88  Wt. (Lbs)   279.12   277.12   BMI   35.84 kg/m2   35.58 kg/m2       05/20/2016    3:15 PM  Foot/eye exam completion dates  Foot Form Completion Done

## 2023-01-25 NOTE — Assessment & Plan Note (Signed)
Annual exam as documented. Counseling done  re healthy lifestyle involving commitment to 150 minutes exercise per week, heart healthy diet, and attaining healthy weight.The importance of adequate sleep also discussed. Regular seat belt use and home safety, is also discussed. Changes in health habits are decided on by the patient with goals and time frames  set for achieving them. Immunization and cancer screening needs are specifically addressed at this visit.Does not want any immunization

## 2023-01-25 NOTE — Patient Instructions (Signed)
Follow-up in 12 weeks, call if you need me sooner.  EKG in office today because of atrial fibrillation, and you are referred to cardiology as we discussed.  Labs to be obtained today which have been ordered in October, nurse please add HbA1c to the labs already ordered.  Continue to work on change in food choices and starting some regular physical activity as able to improve health.  Please do call Urology as you said you would regarding your prostate  Thanks for choosing  Primary Care, we consider it a privelige to serve you.

## 2023-01-26 ENCOUNTER — Encounter: Payer: Self-pay | Admitting: Internal Medicine

## 2023-01-26 ENCOUNTER — Ambulatory Visit (INDEPENDENT_AMBULATORY_CARE_PROVIDER_SITE_OTHER): Payer: Medicare HMO | Admitting: Internal Medicine

## 2023-01-26 DIAGNOSIS — Z0000 Encounter for general adult medical examination without abnormal findings: Secondary | ICD-10-CM

## 2023-01-26 LAB — CBC
Hematocrit: 42.2 % (ref 37.5–51.0)
Hemoglobin: 14 g/dL (ref 13.0–17.7)
MCH: 26.3 pg — ABNORMAL LOW (ref 26.6–33.0)
MCHC: 33.2 g/dL (ref 31.5–35.7)
MCV: 79 fL (ref 79–97)
Platelets: 188 10*3/uL (ref 150–450)
RBC: 5.32 x10E6/uL (ref 4.14–5.80)
RDW: 13.7 % (ref 11.6–15.4)
WBC: 7.8 10*3/uL (ref 3.4–10.8)

## 2023-01-26 LAB — CMP14+EGFR
ALT: 20 IU/L (ref 0–44)
AST: 19 IU/L (ref 0–40)
Albumin/Globulin Ratio: 1.8 (ref 1.2–2.2)
Albumin: 4.3 g/dL (ref 3.8–4.8)
Alkaline Phosphatase: 116 IU/L (ref 44–121)
BUN/Creatinine Ratio: 10 (ref 10–24)
BUN: 15 mg/dL (ref 8–27)
Bilirubin Total: 0.4 mg/dL (ref 0.0–1.2)
CO2: 21 mmol/L (ref 20–29)
Calcium: 9.6 mg/dL (ref 8.6–10.2)
Chloride: 103 mmol/L (ref 96–106)
Creatinine, Ser: 1.49 mg/dL — ABNORMAL HIGH (ref 0.76–1.27)
Globulin, Total: 2.4 g/dL (ref 1.5–4.5)
Glucose: 123 mg/dL — ABNORMAL HIGH (ref 70–99)
Potassium: 4.6 mmol/L (ref 3.5–5.2)
Sodium: 140 mmol/L (ref 134–144)
Total Protein: 6.7 g/dL (ref 6.0–8.5)
eGFR: 48 mL/min/{1.73_m2} — ABNORMAL LOW (ref >59)

## 2023-01-26 LAB — VITAMIN D 25 HYDROXY (VIT D DEFICIENCY, FRACTURES): Vit D, 25-Hydroxy: 33.7 ng/mL (ref 30.0–100.0)

## 2023-01-26 LAB — HEMOGLOBIN A1C
Est. average glucose Bld gHb Est-mCnc: 140 mg/dL
Hgb A1c MFr Bld: 6.5 % — ABNORMAL HIGH (ref 4.8–5.6)

## 2023-01-26 LAB — LIPID PANEL
Chol/HDL Ratio: 4.2 ratio (ref 0.0–5.0)
Cholesterol, Total: 213 mg/dL — ABNORMAL HIGH (ref 100–199)
HDL: 51 mg/dL (ref >39)
LDL Chol Calc (NIH): 138 mg/dL — ABNORMAL HIGH (ref 0–99)
Triglycerides: 137 mg/dL (ref 0–149)
VLDL Cholesterol Cal: 24 mg/dL (ref 5–40)

## 2023-01-26 LAB — URIC ACID: Uric Acid: 6.3 mg/dL (ref 3.8–8.4)

## 2023-01-26 NOTE — Patient Instructions (Signed)
  Carlos Leon , Thank you for taking time to come for your Medicare Wellness Visit. I appreciate your ongoing commitment to your health goals. Please review the following plan we discussed and let me know if I can assist you in the future.   These are the goals we discussed: He will pick up information on Advance Directive at next visit. He is going to continue staying active playing golf and enjoying his retirement with his wife.     This is a list of the screening recommended for you and due dates:  Health Maintenance  Topic Date Due   DTaP/Tdap/Td vaccine (2 - Td or Tdap) 02/06/2022   Medicare Annual Wellness Visit  01/25/2023   COVID-19 Vaccine (4 - 2023-24 season) 02/10/2023*   Flu Shot  03/27/2023*   Zoster (Shingles) Vaccine (1 of 2) 07/13/2023*   Yearly kidney health urinalysis for diabetes  10/05/2027*   Hemoglobin A1C  07/26/2023   Yearly kidney function blood test for diabetes  01/26/2024   Pneumonia Vaccine  Completed   Hepatitis C Screening: USPSTF Recommendation to screen - Ages 87-79 yo.  Completed   HPV Vaccine  Aged Out   Colon Cancer Screening  Discontinued  *Topic was postponed. The date shown is not the original due date.

## 2023-01-26 NOTE — Progress Notes (Signed)
Subjective:  This is a telephone encounter between Carlos Leon and Carlos Leon on 01/26/2023 for AWV. The visit was conducted with the patient located at home and Carlos Leon at [facility]. The patient's identity was confirmed using their DOB and current address. The patient has consented to being evaluated through a telephone encounter and understands the associated risks (an examination cannot be done and the patient may need to come in for an appointment) / benefits (allows the patient to remain at home, decreasing exposure to coronavirus).     Carlos Leon is a 77 y.o. male who presents for Medicare Annual/Subsequent preventive examination.  Review of Systems    Review of Systems  All other systems reviewed and are negative.    Objective:    Today's Vitals   01/26/23 0829  PainSc: 0-No pain   There is no height or weight on file to calculate BMI.     01/26/2023    8:30 AM 01/25/2022    2:39 PM 12/13/2021    9:48 PM 12/13/2021    2:07 PM 10/17/2021   10:22 PM 10/16/2021    9:09 AM 10/09/2021   11:20 AM  Advanced Directives  Does Patient Have a Medical Advance Directive? No No No No No No No  Would patient like information on creating a medical advance directive? Yes (MAU/Ambulatory/Procedural Areas - Information given) No - Patient declined No - Patient declined No - Patient declined No - Patient declined  No - Patient declined    Current Medications (verified) Outpatient Encounter Medications as of 01/26/2023  Medication Sig   allopurinol (ZYLOPRIM) 300 MG tablet TAKE 1 TABLET EVERY DAY (Patient taking differently: Take 300 mg by mouth. 3 times a week)   amLODipine (NORVASC) 10 MG tablet TAKE 1 TABLET (10 MG TOTAL) BY MOUTH DAILY.   benazepril (LOTENSIN) 40 MG tablet Take 1 tablet (40 mg total) by mouth daily.   Vitamin D, Ergocalciferol, (DRISDOL) 1.25 MG (50000 UNIT) CAPS capsule TAKE 1 CAPSULE (50,000 UNITS TOTAL) BY MOUTH ONCE A WEEK.   No facility-administered  encounter medications on file as of 01/26/2023.    Allergies (verified) Chlorthalidone   History: Past Medical History:  Diagnosis Date   Arthritis    Atrial fibrillation ([place])    Present by ECG 01/2012   Colon polyps    Diverticulosis of colon    Erectile dysfunction    Essential hypertension    Gout    Hyperlipidemia    Metabolic syndrome X 06/03/1274   Obesity    Past Surgical History:  Procedure Laterality Date   Colon polyps removed  2002   Dr. Tamala Julian   COLONOSCOPY  2004   Dr. Misty Stanley prep, ACBE showed diverticulosis   COLONOSCOPY  11/24/2011   Procedure: COLONOSCOPY;  Surgeon: Daneil Dolin, MD;  [facility];  Service: Endoscopy;  Laterality: N/A;  9:10   COLONOSCOPY N/A 03/26/2020   Procedure: COLONOSCOPY;  Surgeon: Daneil Dolin, MD;  Location: [facility];  Service: Endoscopy;  Laterality: N/A;  9:30   HERNIA REPAIR     PROSTATE BIOPSY     Family History  Problem Relation Age of Onset   Hypertension Mother    Cancer Mother        Rectal, approximately 28   Diabetes Father    Alzheimer's disease Father    Liver disease Neg Hx    Colon cancer Neg Hx    Social History   Socioeconomic History   Marital status: Married  Spouse name: Not on file   Number of children: 4   Years of education: 55   Highest education level: 12th grade  Occupational History   Occupation: Retired  Tobacco Use   Smoking status: Former    Packs/day: 2.00    Years: 5.00    Total pack years: 10.00    Types: Cigarettes   Smokeless tobacco: Never  Vaping Use   Vaping Use: Never used  Substance and Sexual Activity   Alcohol use: No   Drug use: No   Sexual activity: Not on file  Other Topics Concern   Not on file  Social History Narrative   Lives with wife alone    Social Determinants of Health   Financial Resource Strain: Low Risk  (01/25/2022)   Overall Financial Resource Strain (CARDIA)    Difficulty of Paying Living Expenses: Not hard at all  Food  Insecurity: No Food Insecurity (01/25/2022)   Hunger Vital Sign    Worried About Running Out of Food in the Last Year: Never true    [place] in the Last Year: Never true  Transportation Needs: No Transportation Needs (01/25/2022)   PRAPARE - Hydrologist (Medical): No    Lack of Transportation (Non-Medical): No  Physical Activity: Insufficiently Active (01/25/2022)   Exercise Vital Sign    Days of Exercise per Week: 5 days    Minutes of Exercise per Session: 20 min  Stress: No Stress Concern Present (01/25/2022)   [place]    Feeling of Stress : Not at all  Social Connections: [place] (01/25/2022)   Social Connection and Isolation Panel [NHANES]    Frequency of Communication with Friends and Family: Three times a week    Frequency of Social Gatherings with Friends and Family: Once a week    Attends Religious Services: More than 4 times per year    Active Member of [employer] or Organizations: Yes    Attends Music therapist: More than 4 times per year    Marital Status: Married    Tobacco Counseling Counseling given: Not Answered   Clinical Intake:  Pre-visit preparation completed: Yes  Pain : No/denies pain Pain Score: 0-No pain     Nutritional Status: BMI > 30  Obese  How often do you need to have someone help you when you read instructions, pamphlets, or other written materials from your doctor or pharmacy?: 1 - Never What is the last grade level you completed in school?: 12th grade  Diabetic?No    Activities of Daily Living    01/26/2023    8:31 AM  In your present state of health, do you have any difficulty performing the following activities:  Hearing? 0  Vision? 0  Difficulty concentrating or making decisions? 0  Walking or climbing stairs? 0  Dressing or bathing? 0  Doing errands, shopping? 0    Patient Care Team: [name], MD as PCP - General Domenic Polite Aloha Gell, MD as PCP - Cardiology (Cardiology) Gala Romney Cristopher Estimable, MD as Consulting Physician (Gastroenterology)  Indicate any recent Medical Services you may have received from other than [facility] providers in the past year (date may be approximate).     Assessment:   This is a routine wellness examination for Carlos Leon.  Hearing/Vision screen No results found.  Dietary issues and exercise activities discussed:     Goals Addressed   None    Depression  Screen    01/26/2023    8:31 AM 01/25/2023    8:30 AM 09/28/2022    8:53 AM 01/25/2022    2:39 PM 01/25/2022    2:37 PM 01/21/2022    9:54 AM 09/08/2021    1:13 PM  PHQ 2/9 Scores  PHQ - 2 Score 0 0 0 0 0 0 0    Fall Risk    01/26/2023    8:31 AM 01/25/2023    8:29 AM 09/28/2022    8:53 AM 07/21/2022    8:08 AM 01/25/2022    2:39 PM  Fall Risk   Falls in the past year? 0 0 0 0 0  Number falls in past yr: 0 0 0 0 0  Injury with Fall? 0 0 0 0 0  Risk for fall due to :  No Fall Risks No Fall Risks  No Fall Risks  Follow up   Falls evaluation completed  Falls evaluation completed    FALL RISK PREVENTION PERTAINING TO THE HOME:  Any stairs in or around the home? No  If so, are there any without handrails? No  Home free of loose throw rugs in walkways, pet beds, electrical cords, etc? Yes  Adequate lighting in your home to reduce risk of falls? Yes   ASSISTIVE DEVICES UTILIZED TO PREVENT FALLS:  Life alert? No  Use of a cane, walker or w/c? No  Grab bars in the bathroom? Yes  Shower chair or bench in shower? No  Elevated toilet seat or a handicapped toilet? Yes     Cognitive Function:    01/25/2022    2:40 PM  MMSE - Mini Mental State Exam  Not completed: Unable to complete        01/25/2022    2:40 PM 01/21/2021    8:38 AM 01/18/2020    8:07 AM 01/15/2019   10:31 AM 01/02/2018    2:28 PM  6CIT Screen  What Year? 0 points 0 points 0 points 0 points 0 points  What month? 0 points 0 points 0  points 0 points 0 points  What time? 0 points 0 points 0 points 0 points 0 points  Count back from 20 0 points 0 points 0 points 0 points 0 points  Months in reverse 0 points 0 points 0 points 0 points 0 points  Repeat phrase 0 points 0 points 0 points 0 points 2 points  Total Score 0 points 0 points 0 points 0 points 2 points    Immunizations Immunization History  Administered Date(s) Administered   Influenza-Unspecified 03/16/2007   PFIZER(Purple Top)SARS-COV-2 Vaccination 01/31/2020, 02/21/2020, 10/31/2020   Pneumococcal Conjugate-13 08/20/2014   Pneumococcal Polysaccharide-23 02/07/2012   Tdap 02/07/2012    TDAP status: Due, Education has been provided regarding the importance of this vaccine. Advised may receive this vaccine at local pharmacy or Health Dept. Aware to provide a copy of the vaccination record if obtained from local pharmacy or Health Dept. Verbalized acceptance and understanding.  Flu Vaccine status: Declined, Education has been provided regarding the importance of this vaccine but patient still declined. Advised may receive this vaccine at local pharmacy or Health Dept. Aware to provide a copy of the vaccination record if obtained from local pharmacy or Health Dept. Verbalized acceptance and understanding.  Pneumococcal vaccine status: Due, Education has been provided regarding the importance of this vaccine. Advised may receive this vaccine at local pharmacy or Health Dept. Aware to provide a copy of the vaccination record if obtained from  local pharmacy or Health Dept. Verbalized acceptance and understanding.  Covid-19 vaccine status: Declined, Education has been provided regarding the importance of this vaccine but patient still declined. Advised may receive this vaccine at local pharmacy or Health Dept.or vaccine clinic. Aware to provide a copy of the vaccination record if obtained from local pharmacy or Health Dept. Verbalized acceptance and understanding. Qualifies  for Shingles Vaccine? Yes   Zostavax completed No   Shingrix Completed?: No.    Education has been provided regarding the importance of this vaccine. Patient has been advised to call insurance company to determine out of pocket expense if they have not yet received this vaccine. Advised may also receive vaccine at local pharmacy or Health Dept. Verbalized acceptance and understanding.  Screening Tests Health Maintenance  Topic Date Due   DTaP/Tdap/Td (2 - Td or Tdap) 02/06/2022   Medicare Annual Wellness (AWV)  01/25/2023   COVID-19 Vaccine (4 - 2023-24 season) 02/10/2023 (Originally 08/27/2022)   INFLUENZA VACCINE  03/27/2023 (Originally 07/27/2022)   Zoster Vaccines- Shingrix (1 of 2) 07/13/2023 (Originally 09/16/1965)   Diabetic kidney evaluation - Urine ACR  10/05/2027 (Originally 10/23/2009)   HEMOGLOBIN A1C  07/26/2023   Diabetic kidney evaluation - eGFR measurement  01/26/2024   Pneumonia Vaccine 60+ Years old  Completed   Hepatitis C Screening  Completed   HPV VACCINES  Aged Out   COLONOSCOPY (Pts 45-59yr Insurance coverage will need to be confirmed)  Discontinued    Health Maintenance  Health Maintenance Due  Topic Date Due   DTaP/Tdap/Td (2 - Td or Tdap) 02/06/2022   Medicare Annual Wellness (AWV)  01/25/2023    Colorectal cancer screening: No longer required.   Lung Cancer Screening: (Low Dose CT Chest recommended if Age 77-80years, 30 pack-year currently smoking OR have quit w/in 15years.) does not qualify.     Additional Screening:  Hepatitis C Screening: does not qualify; Completed 05/20/2016  Vision Screening: Recommended annual ophthalmology exams for early detection of glaucoma and other disorders of the eye. Is the patient up to date with their annual eye exam?  Yes  Who is the provider or what is the name of the office in which the patient attends annual eye exams? [facility] If pt is not established with a provider, would they like to be referred to a provider  to establish care? No .   Dental Screening: Recommended annual dental exams for proper oral hygiene  Community Resource Referral / Chronic Care Management: CRR required this visit?  No   CCM required this visit?  No      Plan:     I have personally reviewed and noted the following in the patient's chart:   Medical and social history Use of alcohol, tobacco or illicit drugs  Current medications and supplements including opioid prescriptions. Patient is not currently taking opioid prescriptions. Functional ability and status Nutritional status Physical activity Advanced directives List of other physicians Hospitalizations, surgeries, and ER visits in previous 12 months Vitals Screenings to include cognitive, depression, and falls Referrals and appointments  In addition, I have reviewed and discussed with patient certain preventive protocols, quality metrics, and best practice recommendations. A written personalized care plan for preventive services as well as general preventive health recommendations were provided to patient.     JLorene Dy MD   01/26/2023

## 2023-02-15 NOTE — Progress Notes (Unsigned)
Cardiology Office Note  Date: 02/16/2023   ID: Carlos Leon, DOB 02-15-1946, MRN [id number]  PCP:  [name], MD  Cardiologist:  Rozann Lesches, MD Electrophysiologist:  None   Chief Complaint  Patient presents with   Cardiac follow-up    History of Present Illness: Carlos Leon is a 77 y.o. male last seen in May 2022.  He is referred back to the office by Dr. Moshe Cipro for further discussion of permanent atrial fibrillation and stroke risk.  He is asymptomatic in terms of palpitations, heart rate is reasonable in the absence of AV nodal blockers as well.  I did review his recent ECG from January.  His CHA2DS2-VASc score is 3.  We have discussed stroke risk and indication for anticoagulation in the past as well as again today.  As before he remains adamant that he does not want to take any anticoagulation.  He states that he is comfortable with what ever happens and takes life day by day, not worried about the future.  He states that his siblings as well as father have all had atrial fibrillation, none of them have taken blood thinners.  Past Medical History:  Diagnosis Date   Arthritis    Atrial fibrillation [facility])    Present by ECG 01/2012   Colon polyps    Diverticulosis of colon    Erectile dysfunction    Essential hypertension    Gout    Hyperlipidemia    Metabolic syndrome X [id number]   Obesity     Current Outpatient Medications  Medication Sig Dispense Refill   allopurinol (ZYLOPRIM) 300 MG tablet TAKE 1 TABLET EVERY DAY (Patient taking differently: Take 300 mg by mouth daily.) 90 tablet 1   amLODipine (NORVASC) 10 MG tablet TAKE 1 TABLET (10 MG TOTAL) BY MOUTH DAILY. 90 tablet 3   benazepril (LOTENSIN) 40 MG tablet Take 1 tablet (40 mg total) by mouth daily. 90 tablet 3   Vitamin D, Ergocalciferol, (DRISDOL) 1.25 MG (50000 UNIT) CAPS capsule TAKE 1 CAPSULE (50,000 UNITS TOTAL) BY MOUTH ONCE A WEEK. 12 capsule 10   No current facility-administered  medications for this visit.   Allergies:  Chlorthalidone   ROS: No orthopnea or PND.  Physical Exam: VS:  BP 136/84   Pulse 85   Ht 6' 2"$  (1.88 m)   Wt 283 lb (128.4 kg)   SpO2 97%   BMI 36.34 kg/m , BMI Body mass index is 36.34 kg/m.  Wt Readings from Last 3 Encounters:  02/16/23 283 lb (128.4 kg)  01/25/23 279 lb 1.9 oz (126.6 kg)  09/28/22 277 lb 1.9 oz (125.7 kg)    General: Patient appears comfortable at rest. HEENT: Conjunctiva and lids normal. Lungs: Clear to auscultation, nonlabored breathing at rest. Cardiac: Irregularly irregular without gallop.  ECG:  An ECG dated 01/25/2023 was personally reviewed today and demonstrated:  Rate controlled atrial fibrillation with nonspecific T wave changes and poor R wave progression.  Recent Labwork: 01/25/2023: ALT 20; AST 19; BUN 15; Creatinine, Ser 1.49; Hemoglobin 14.0; Platelets 188; Potassium 4.6; Sodium 140     Component Value Date/Time   CHOL 213 (H) 01/25/2023 0953   TRIG 137 01/25/2023 0953   HDL 51 01/25/2023 0953   CHOLHDL 4.2 01/25/2023 0953   CHOLHDL 4.2 11/19/2019 1020   VLDL 21 04/29/2017 0814   LDLCALC 138 (H) 01/25/2023 0953   LDLCALC 124 (H) 11/19/2019 1020   LDLDIRECT 125 05/20/2016 1645    Other Studies Reviewed Today:  Echocardiogram  12/14/2021:  1. Left ventricular ejection fraction, by estimation, is 55 to 60%. The  left ventricle has normal function. The left ventricle has no regional  wall motion abnormalities. Left ventricular diastolic parameters are  indeterminate.   2. Right ventricular systolic function is normal. The right ventricular  size is normal. There is mildly elevated pulmonary artery systolic  pressure.   3. Left atrial size was severely dilated.   4. Right atrial size was severely dilated.   5. The mitral valve is degenerative. Mild mitral valve regurgitation. No  evidence of mitral stenosis. Moderate mitral annular calcification.   6. Tricuspid valve regurgitation is  moderate.   7. The aortic valve is tricuspid. There is mild calcification of the  aortic valve. Aortic valve regurgitation is trivial. Aortic valve  sclerosis/calcification is present, without any evidence of aortic  stenosis.   8. The inferior vena cava is normal in size with greater than 50%  respiratory variability, suggesting right atrial pressure of 3 mmHg.   Assessment and Plan:  1.  Permanent atrial fibrillation with CHA2DS2-VASc score of 3.  As discussed above, he remains quite clear that he does not want to take any form of anticoagulation.  Aspirin would not reduce stroke risk in the setting of atrial fibrillation.  His heart rate is reasonable in the absence of AV nodal blockers and he is asymptomatic in terms of palpitations.  We did discuss the rationale for stroke prophylaxis and available options, he does not want to pursue any change in his current course and will otherwise keep follow-up with Dr. Moshe Cipro for now.  2.  Essential hypertension, currently on Norvasc and Lotensin.  No changes were made today.  Keep follow-up with Dr. Moshe Cipro.  Medication Adjustments/Labs and Tests Ordered: Current medicines are reviewed at length with the patient today.  Concerns regarding medicines are outlined above.   Tests Ordered: No orders of the defined types were placed in this encounter.   Medication Changes: No orders of the defined types were placed in this encounter.   Disposition:  Follow up prn  Signed, Satira Sark, MD, [facility] 02/16/2023 10:55 AM    [facility]. [address], [place], [place] [address] Phone: [phone]; Fax: [phone]

## 2023-02-16 ENCOUNTER — Encounter: Payer: Self-pay | Admitting: Cardiology

## 2023-02-16 ENCOUNTER — Ambulatory Visit: Payer: Medicare HMO | Attending: Cardiology | Admitting: Cardiology

## 2023-02-16 VITALS — BP 136/84 | HR 85 | Ht 74.0 in | Wt 283.0 lb

## 2023-02-16 DIAGNOSIS — I4821 Permanent atrial fibrillation: Secondary | ICD-10-CM

## 2023-02-16 DIAGNOSIS — I10 Essential (primary) hypertension: Secondary | ICD-10-CM | POA: Diagnosis not present

## 2023-02-16 NOTE — Patient Instructions (Signed)
Medication Instructions:  Your physician recommends that you continue on your current medications as directed. Please refer to the Current Medication list given to you today.   Labwork: None  Testing/Procedures: None  Follow-Up: Follow up with Dr. McDowell as needed.   Any Other Special Instructions Will Be Listed Below (If Applicable).     If you need a refill on your cardiac medications before your next appointment, please call your pharmacy.  

## 2023-02-24 ENCOUNTER — Encounter: Payer: Self-pay | Admitting: Radiology

## 2023-02-24 ENCOUNTER — Ambulatory Visit: Payer: Medicare HMO | Admitting: Urology

## 2023-03-03 ENCOUNTER — Other Ambulatory Visit: Payer: Self-pay | Admitting: Nurse Practitioner

## 2023-03-30 ENCOUNTER — Ambulatory Visit: Payer: Medicare HMO | Admitting: Cardiology

## 2023-04-19 ENCOUNTER — Encounter: Payer: Self-pay | Admitting: Family Medicine

## 2023-04-19 ENCOUNTER — Ambulatory Visit (INDEPENDENT_AMBULATORY_CARE_PROVIDER_SITE_OTHER): Payer: Medicare HMO | Admitting: Family Medicine

## 2023-04-19 VITALS — BP 160/70 | HR 70 | Ht 74.0 in | Wt 264.0 lb

## 2023-04-19 DIAGNOSIS — E7800 Pure hypercholesterolemia, unspecified: Secondary | ICD-10-CM

## 2023-04-19 DIAGNOSIS — E785 Hyperlipidemia, unspecified: Secondary | ICD-10-CM | POA: Diagnosis not present

## 2023-04-19 DIAGNOSIS — I4891 Unspecified atrial fibrillation: Secondary | ICD-10-CM

## 2023-04-19 DIAGNOSIS — N183 Chronic kidney disease, stage 3 unspecified: Secondary | ICD-10-CM | POA: Diagnosis not present

## 2023-04-19 DIAGNOSIS — R7303 Prediabetes: Secondary | ICD-10-CM

## 2023-04-19 DIAGNOSIS — M109 Gout, unspecified: Secondary | ICD-10-CM

## 2023-04-19 DIAGNOSIS — N1831 Chronic kidney disease, stage 3a: Secondary | ICD-10-CM

## 2023-04-19 DIAGNOSIS — I10 Essential (primary) hypertension: Secondary | ICD-10-CM | POA: Diagnosis not present

## 2023-04-19 DIAGNOSIS — D573 Sickle-cell trait: Secondary | ICD-10-CM | POA: Diagnosis not present

## 2023-04-19 MED ORDER — VITAMIN D (ERGOCALCIFEROL) 1.25 MG (50000 UNIT) PO CAPS: 50000.0000 [IU] | ORAL_CAPSULE | ORAL | 3 refills | Status: DC

## 2023-04-19 MED ORDER — APIXABAN 5 MG PO TABS: 5.0000 mg | ORAL_TABLET | Freq: Two times a day (BID) | ORAL | 3 refills | Status: DC

## 2023-04-19 MED ORDER — AMLODIPINE BESYLATE 10 MG PO TABS: 10.0000 mg | ORAL_TABLET | Freq: Every day | ORAL | 3 refills | Status: DC

## 2023-04-19 MED ORDER — BENAZEPRIL HCL 40 MG PO TABS: 40.0000 mg | ORAL_TABLET | Freq: Every day | ORAL | 3 refills | Status: DC

## 2023-04-19 NOTE — Assessment & Plan Note (Addendum)
Uncontrolled, re eval in 6 to 8 weeks DASH diet and commitment to daily physical activity for a minimum of 30 minutes discussed and encouraged, as a part of hypertension management. The importance of attaining a healthy weight is also discussed.     04/19/2023    9:36 AM 04/19/2023    9:06 AM 02/16/2023   10:26 AM 01/25/2023    9:07 AM 01/25/2023    8:30 AM 01/25/2023    8:28 AM 09/28/2022    9:18 AM  BP/Weight  Systolic BP 160 177 136 140 162 167 128  Diastolic BP 70 73 84 78 80 77 70  Wt. (Lbs)  264 283   279.12   BMI  33.9 kg/m2 36.34 kg/m2   35.84 kg/m2

## 2023-04-19 NOTE — Patient Instructions (Addendum)
F/U in 6 to 8  weeks, re evaluate blood pressure and lab review  Fasting lipid CMP and EGFR and HbA1c to be obtained 3 to 5 days before your next visit.  CONGRATS on weight loss ,  Very important that you take your blood pressure medication at the same time every day consistently, your blood pressure is high at this visit because you reported forgetting some of to take your medicine.  Very thankful that you have decided to start the blood thinner to reduce your risk of stroke I will prescribe this through your mail order.  Please commit to starting exercise for at least 5 days/week 30 minutes per session, this will contribute towards very good health.

## 2023-04-19 NOTE — Assessment & Plan Note (Signed)
Hyperlipidemia:Low fat diet discussed and encouraged.   Lipid Panel  Lab Results  Component Value Date   CHOL 213 (H) 01/25/2023   HDL 51 01/25/2023   LDLCALC 138 (H) 01/25/2023   LDLDIRECT 125 05/20/2016   TRIG 137 01/25/2023   CHOLHDL 4.2 01/25/2023     Updated lab needed at/ before next visit.

## 2023-04-19 NOTE — Assessment & Plan Note (Signed)
Updated lab needed at/ before next visit.  Patient educated about the importance of limiting  Carbohydrate intake , the need to commit to daily physical activity for a minimum of 30 minutes , and to commit weight loss. The fact that changes in all these areas will reduce or eliminate all together the development of diabetes is stressed.      Latest Ref Rng & Units 01/25/2023    9:53 AM 09/28/2022    9:39 AM 07/21/2022    8:41 AM 01/21/2022   11:01 AM 01/21/2022   10:05 AM  Diabetic Labs  HbA1c 4.8 - 5.6 % 6.5  6.0    5.7   Chol 100 - 199 mg/dL 161    096    HDL >04 mg/dL 51    54    Calc LDL 0 - 99 mg/dL 540    981    Triglycerides 0 - 149 mg/dL 191    478    Creatinine 0.76 - 1.27 mg/dL 2.95   6.21  3.08        04/19/2023    9:36 AM 04/19/2023    9:06 AM 02/16/2023   10:26 AM 01/25/2023    9:07 AM 01/25/2023    8:30 AM 01/25/2023    8:28 AM 09/28/2022    9:18 AM  BP/Weight  Systolic BP 160 177 136 140 162 167 128  Diastolic BP 70 73 84 78 80 77 70  Wt. (Lbs)  264 283   279.12   BMI  33.9 kg/m2 36.34 kg/m2   35.84 kg/m2       05/20/2016    3:15 PM  Foot/eye exam completion dates  Foot Form Completion Done

## 2023-04-24 ENCOUNTER — Encounter: Payer: Self-pay | Admitting: Family Medicine

## 2023-04-24 NOTE — Assessment & Plan Note (Signed)
  Patient re-educated about  the importance of commitment to a  minimum of 150 minutes of exercise per week as able.  The importance of healthy food choices with portion control discussed, as well as eating regularly and within a 12 hour window most days. The need to choose "clean , green" food 50 to 75% of the time is discussed, as well as to make water the primary drink and set a goal of 64 ounces water daily.       04/19/2023    9:06 AM 02/16/2023   10:26 AM 01/25/2023    8:28 AM  Weight /BMI  Weight 264 lb 283 lb 279 lb 1.9 oz  Height 6\' 2"  (1.88 m) 6\' 2"  (1.88 m) 6\' 2"  (1.88 m)  BMI 33.9 kg/m2 36.34 kg/m2 35.84 kg/m2   Improved, congratulated on thias and encouraged to continue good health habits

## 2023-04-24 NOTE — Assessment & Plan Note (Signed)
Plant based diet and regular exercise with water intake of 64 oz or more / day encouraged

## 2023-04-24 NOTE — Assessment & Plan Note (Signed)
Rate controlled , now decided to start eliquis, same prescribed

## 2023-04-24 NOTE — Assessment & Plan Note (Signed)
No recent flares and uric acid at target level

## 2023-04-24 NOTE — Progress Notes (Addendum)
Carlos Leon     MRN: [id number]      DOB: Sep 09, 1946  Chief Complaint  Patient presents with   Hypertension    HPI [name] is here for follow up and re-evaluation of chronic medical conditions, medication management and review of any available recent lab and radiology data.  Preventive health is updated, specifically  Cancer screening and Immunization.   Questions or concerns regarding consultations or procedures which the PT has had in the interim are  addressed.saw Cardiology, decided against eliquis at that visit, however states he has decided to take it now at this visit The PT denies any adverse reactions to current medications since the last visit.  There are no new concerns.  There are no specific complaints   ROS Denies recent fever or chills. Denies sinus pressure, nasal congestion, ear pain or sore throat. Denies chest congestion, productive cough or wheezing. Denies chest pains, palpitations and leg swelling Denies abdominal pain, nausea, vomiting,diarrhea or constipation.   Denies dysuria, frequency, hesitancy or incontinence. Denies joint pain, swelling and limitation in mobility. Denies headaches, seizures, numbness, or tingling. Denies depression, anxiety or insomnia. Denies skin break down or rash.   PE  BP (!) 160/70   Pulse 70   Ht 6\' 2"  (1.88 m)   Wt 264 lb (119.7 kg)   SpO2 98%   BMI 33.90 kg/m   Patient alert and oriented and in no cardiopulmonary distress.  HEENT: No facial asymmetry, EOMI,     Neck supple .  Chest: Clear to auscultation bilaterally.  CVS: S1, S2 no murmurs, no S3.Iregulalrly irregular pulse  ABD: Soft non tender.   Ext: No edema  MS: Adequate ROM spine, shoulders, hips and knees.  Skin: Intact, no ulcerations or rash noted.  Psych: Good eye contact, normal affect. Memory intact not anxious or depressed appearing.  CNS: CN 2-12 intact, power,  normal throughout.no focal deficits noted.   Assessment &  Plan  Prediabetes Updated lab needed at/ before next visit.  Patient educated about the importance of limiting  Carbohydrate intake , the need to commit to daily physical activity for a minimum of 30 minutes , and to commit weight loss. The fact that changes in all these areas will reduce or eliminate all together the development of diabetes is stressed.      Latest Ref Rng & Units 01/25/2023    9:53 AM 09/28/2022    9:39 AM 07/21/2022    8:41 AM 01/21/2022   11:01 AM 01/21/2022   10:05 AM  Diabetic Labs  HbA1c 4.8 - 5.6 % 6.5  6.0    5.7   Chol 100 - 199 mg/dL 409    811    HDL >91 mg/dL 51    54    Calc LDL 0 - 99 mg/dL 478    295    Triglycerides 0 - 149 mg/dL 621    308    Creatinine 0.76 - 1.27 mg/dL 6.57   8.46  9.62        04/19/2023    9:36 AM 04/19/2023    9:06 AM 02/16/2023   10:26 AM 01/25/2023    9:07 AM 01/25/2023    8:30 AM 01/25/2023    8:28 AM 09/28/2022    9:18 AM  BP/Weight  Systolic BP 160 177 136 140 162 167 128  Diastolic BP 70 73 84 78 80 77 70  Wt. (Lbs)  264 283   279.12   BMI  33.9 kg/m2 36.34  kg/m2   35.84 kg/m2       05/20/2016    3:15 PM  Foot/eye exam completion dates  Foot Form Completion Done      Pure hypercholesterolemia Hyperlipidemia:Low fat diet discussed and encouraged.   Lipid Panel  Lab Results  Component Value Date   CHOL 213 (H) 01/25/2023   HDL 51 01/25/2023   LDLCALC 138 (H) 01/25/2023   LDLDIRECT 125 05/20/2016   TRIG 137 01/25/2023   CHOLHDL 4.2 01/25/2023     Updated lab needed at/ before next visit.   Essential hypertension, benign Uncontrolled, re eval in 6 to 8 weeks DASH diet and commitment to daily physical activity for a minimum of 30 minutes discussed and encouraged, as a part of hypertension management. The importance of attaining a healthy weight is also discussed.     04/19/2023    9:36 AM 04/19/2023    9:06 AM 02/16/2023   10:26 AM 01/25/2023    9:07 AM 01/25/2023    8:30 AM 01/25/2023    8:28 AM  09/28/2022    9:18 AM  BP/Weight  Systolic BP 160 177 136 140 162 167 128  Diastolic BP 70 73 84 78 80 77 70  Wt. (Lbs)  264 283   279.12   BMI  33.9 kg/m2 36.34 kg/m2   35.84 kg/m2        Atrial fibrillation (HCC) Rate controlled , now decided to start eliquis, same prescribed  CKD (chronic kidney disease) stage 3, GFR 30-59 ml/min (HCC) Plant based diet and regular exercise with water intake of 64 oz or more / day encouraged  Hyperlipemia Hyperlipidemia:Low fat diet discussed and encouraged.   Lipid Panel  Lab Results  Component Value Date   CHOL 213 (H) 01/25/2023   HDL 51 01/25/2023   LDLCALC 138 (H) 01/25/2023   LDLDIRECT 125 05/20/2016   TRIG 137 01/25/2023   CHOLHDL 4.2 01/25/2023     Updated lab needed at/ before next visit. Needs to reduce ditary fat  Gout, unspecified No recent flares and uric acid at target level

## 2023-04-24 NOTE — Assessment & Plan Note (Signed)
Hyperlipidemia:Low fat diet discussed and encouraged.   Lipid Panel  Lab Results  Component Value Date   CHOL 213 (H) 01/25/2023   HDL 51 01/25/2023   LDLCALC 138 (H) 01/25/2023   LDLDIRECT 125 05/20/2016   TRIG 137 01/25/2023   CHOLHDL 4.2 01/25/2023     Updated lab needed at/ before next visit. Needs to reduce ditary fat

## 2023-05-16 ENCOUNTER — Telehealth: Payer: Self-pay | Admitting: Family Medicine

## 2023-05-16 NOTE — Telephone Encounter (Signed)
Pt called and requested a medication for sinus drainage. Will schedule an appt if needed.

## 2023-05-16 NOTE — Telephone Encounter (Signed)
Patient aware will try otc products first no fever or chills

## 2023-06-08 ENCOUNTER — Ambulatory Visit: Payer: Medicare HMO | Admitting: Family Medicine

## 2023-07-22 ENCOUNTER — Ambulatory Visit: Payer: Medicare HMO | Admitting: Family Medicine

## 2023-08-01 DIAGNOSIS — R7303 Prediabetes: Secondary | ICD-10-CM | POA: Diagnosis not present

## 2023-08-09 ENCOUNTER — Ambulatory Visit (INDEPENDENT_AMBULATORY_CARE_PROVIDER_SITE_OTHER): Payer: Medicare HMO | Admitting: Family Medicine

## 2023-08-09 ENCOUNTER — Encounter: Payer: Self-pay | Admitting: Family Medicine

## 2023-08-09 VITALS — BP 149/75 | HR 64 | Ht 74.0 in | Wt 267.1 lb

## 2023-08-09 DIAGNOSIS — E785 Hyperlipidemia, unspecified: Secondary | ICD-10-CM | POA: Diagnosis not present

## 2023-08-09 DIAGNOSIS — I4891 Unspecified atrial fibrillation: Secondary | ICD-10-CM

## 2023-08-09 DIAGNOSIS — I10 Essential (primary) hypertension: Secondary | ICD-10-CM

## 2023-08-09 DIAGNOSIS — M109 Gout, unspecified: Secondary | ICD-10-CM

## 2023-08-09 DIAGNOSIS — R7303 Prediabetes: Secondary | ICD-10-CM

## 2023-08-09 MED ORDER — CARVEDILOL 3.125 MG PO TABS: 3.1250 mg | ORAL_TABLET | Freq: Two times a day (BID) | ORAL | 3 refills | Status: DC

## 2023-08-09 NOTE — Patient Instructions (Addendum)
F/U in 8 weeks , re wevaluate blood pressure  New med for bP start today, carvedilol 3.125 mmg take at 9am and 9pm Take your other 2 bP meds at 9a every day    [facility] lab this  week lipid, cmp an deGFr , Uric acd level  As discussed you will start a statin after I get update labs to help to protect your heart  Keep up regular exercise , work at it   Blood sugar is better and will continue to improve  New pharmacy [facility]  Thanks for choosing [facility], we consider it a privelige to serve you.

## 2023-08-13 ENCOUNTER — Encounter: Payer: Self-pay | Admitting: Family Medicine

## 2023-08-13 NOTE — Progress Notes (Signed)
Carlos Leon     MRN: [id number]      DOB: 02-23-1946  Chief Complaint  Patient presents with   Follow-up    Follow up patient reports not taking a blood thinner    HPI Carlos Leon is here for follow up and re-evaluation of chronic medical conditions, medication management and review of any available recent lab and radiology data.  Preventive health is updated, specifically  Cancer screening and Immunization.   Questions or concerns regarding consultations or procedures which the PT has had in the interim are  addressed. The PT denies any adverse reactions to current medications since the last visit. Decided against eliquis  There are no new concerns.  There are no specific complaints   ROS Denies recent fever or chills. Denies sinus pressure, nasal congestion, ear pain or sore throat. Denies chest congestion, productive cough or wheezing. Denies chest pains, palpitations and leg swelling Denies abdominal pain, nausea, vomiting,diarrhea or constipation.   Denies dysuria, frequency, hesitancy or incontinence. Denies joint pain, swelling and limitation in mobility. Denies headaches, seizures, numbness, or tingling. Denies depression, anxiety or insomnia. Denies skin break down or rash.   PE  BP (!) 149/75 (BP Location: Left Arm, Patient Position: Sitting, Cuff Size: Large)   Pulse 64   Ht 6\' 2"  (1.88 m)   Wt 267 lb 1.3 oz (121.1 kg)   SpO2 97%   BMI 34.29 kg/m   Patient alert and oriented and in no cardiopulmonary distress.  HEENT: No facial asymmetry, EOMI,     Neck supple .  Chest: Clear to auscultation bilaterally.  CVS: S1, S2 no murmurs, no S3.IrRegular rate.  ABD: Soft non tender.   Ext: No edema  MS: Adequate ROM spine, shoulders, hips and knees.  Skin: Intact, no ulcerations or rash noted.  Psych: Good eye contact, normal affect. Memory intact not anxious or depressed appearing.  CNS: CN 2-12 intact, power,  normal throughout.no focal deficits  noted.   Assessment & Plan  Essential hypertension, benign Not at goal, add coreg DASH diet and commitment to daily physical activity for a minimum of 30 minutes discussed and encouraged, as a part of hypertension management. The importance of attaining a healthy weight is also discussed.     08/09/2023   11:10 AM 08/09/2023   11:08 AM 04/19/2023    9:36 AM 04/19/2023    9:06 AM 02/16/2023   10:26 AM 01/25/2023    9:07 AM 01/25/2023    8:30 AM  BP/Weight  Systolic BP 149 145 160 177 136 140 162  Diastolic BP 75 72 70 73 84 78 80  Wt. (Lbs)  267.08  264 283    BMI  34.29 kg/m2  33.9 kg/m2 36.34 kg/m2         Hyperlipemia Hyperlipidemia:Low fat diet discussed and encouraged.   Lipid Panel  Lab Results  Component Value Date   CHOL 213 (H) 01/25/2023   HDL 51 01/25/2023   LDLCALC 138 (H) 01/25/2023   LDLDIRECT 125 05/20/2016   TRIG 137 01/25/2023   CHOLHDL 4.2 01/25/2023     Updated lab needed at/ before next visit. Will start statin reportedly  Prediabetes Patient educated about the importance of limiting  Carbohydrate intake , the need to commit to daily physical activity for a minimum of 30 minutes , and to commit weight loss. The fact that changes in all these areas will reduce or eliminate all together the development of diabetes is stressed.  Latest Ref Rng & Units 08/01/2023    9:29 AM 01/25/2023    9:53 AM 09/28/2022    9:39 AM 07/21/2022    8:41 AM 01/21/2022   11:01 AM  Diabetic Labs  HbA1c 4.8 - 5.6 % 6.4  6.5  6.0     Chol 100 - 199 mg/dL  161    096   HDL >04 mg/dL  51    54   Calc LDL 0 - 99 mg/dL  540    981   Triglycerides 0 - 149 mg/dL  191    478   Creatinine 0.76 - 1.27 mg/dL  2.95   6.21  3.08       08/09/2023   11:10 AM 08/09/2023   11:08 AM 04/19/2023    9:36 AM 04/19/2023    9:06 AM 02/16/2023   10:26 AM 01/25/2023    9:07 AM 01/25/2023    8:30 AM  BP/Weight  Systolic BP 149 145 160 177 136 140 162  Diastolic BP 75 72 70 73 84 78 80   Wt. (Lbs)  267.08  264 283    BMI  34.29 kg/m2  33.9 kg/m2 36.34 kg/m2        05/20/2016    3:15 PM  Foot/eye exam completion dates  Foot Form Completion Done    improved  Atrial fibrillation (HCC) Refuses eliquis , rate controlled   Morbid obesity (HCC)  Patient re-educated about  the importance of commitment to a  minimum of 150 minutes of exercise per week as able.  The importance of healthy food choices with portion control discussed, as well as eating regularly and within a 12 hour window most days. The need to choose "clean , green" food 50 to 75% of the time is discussed, as well as to make water the primary drink and set a goal of 64 ounces water daily.       08/09/2023   11:08 AM 04/19/2023    9:06 AM 02/16/2023   10:26 AM  Weight /BMI  Weight 267 lb 1.3 oz 264 lb 283 lb  Height 6\' 2"  (1.88 m) 6\' 2"  (1.88 m) 6\' 2"  (1.88 m)  BMI 34.29 kg/m2 33.9 kg/m2 36.34 kg/m2    Improved overall, he is applauded on thois

## 2023-08-13 NOTE — Assessment & Plan Note (Signed)
Refuses eliquis , rate controlled

## 2023-08-13 NOTE — Assessment & Plan Note (Signed)
  Patient re-educated about  the importance of commitment to a  minimum of 150 minutes of exercise per week as able.  The importance of healthy food choices with portion control discussed, as well as eating regularly and within a 12 hour window most days. The need to choose "clean , green" food 50 to 75% of the time is discussed, as well as to make water the primary drink and set a goal of 64 ounces water daily.       08/09/2023   11:08 AM 04/19/2023    9:06 AM 02/16/2023   10:26 AM  Weight /BMI  Weight 267 lb 1.3 oz 264 lb 283 lb  Height 6\' 2"  (1.88 m) 6\' 2"  (1.88 m) 6\' 2"  (1.88 m)  BMI 34.29 kg/m2 33.9 kg/m2 36.34 kg/m2    Improved overall, he is applauded on thois

## 2023-08-13 NOTE — Assessment & Plan Note (Signed)
Not at goal, add coreg DASH diet and commitment to daily physical activity for a minimum of 30 minutes discussed and encouraged, as a part of hypertension management. The importance of attaining a healthy weight is also discussed.     08/09/2023   11:10 AM 08/09/2023   11:08 AM 04/19/2023    9:36 AM 04/19/2023    9:06 AM 02/16/2023   10:26 AM 01/25/2023    9:07 AM 01/25/2023    8:30 AM  BP/Weight  Systolic BP 149 145 160 177 136 140 162  Diastolic BP 75 72 70 73 84 78 80  Wt. (Lbs)  267.08  264 283    BMI  34.29 kg/m2  33.9 kg/m2 36.34 kg/m2

## 2023-08-13 NOTE — Assessment & Plan Note (Signed)
Hyperlipidemia:Low fat diet discussed and encouraged.   Lipid Panel  Lab Results  Component Value Date   CHOL 213 (H) 01/25/2023   HDL 51 01/25/2023   LDLCALC 138 (H) 01/25/2023   LDLDIRECT 125 05/20/2016   TRIG 137 01/25/2023   CHOLHDL 4.2 01/25/2023     Updated lab needed at/ before next visit. Will start statin reportedly

## 2023-08-13 NOTE — Assessment & Plan Note (Signed)
Patient educated about the importance of limiting  Carbohydrate intake , the need to commit to daily physical activity for a minimum of 30 minutes , and to commit weight loss. The fact that changes in all these areas will reduce or eliminate all together the development of diabetes is stressed.      Latest Ref Rng & Units 08/01/2023    9:29 AM 01/25/2023    9:53 AM 09/28/2022    9:39 AM 07/21/2022    8:41 AM 01/21/2022   11:01 AM  Diabetic Labs  HbA1c 4.8 - 5.6 % 6.4  6.5  6.0     Chol 100 - 199 mg/dL  254    270   HDL >62 mg/dL  51    54   Calc LDL 0 - 99 mg/dL  376    283   Triglycerides 0 - 149 mg/dL  151    761   Creatinine 0.76 - 1.27 mg/dL  6.07   3.71  0.62       08/09/2023   11:10 AM 08/09/2023   11:08 AM 04/19/2023    9:36 AM 04/19/2023    9:06 AM 02/16/2023   10:26 AM 01/25/2023    9:07 AM 01/25/2023    8:30 AM  BP/Weight  Systolic BP 149 145 160 177 136 140 162  Diastolic BP 75 72 70 73 84 78 80  Wt. (Lbs)  267.08  264 283    BMI  34.29 kg/m2  33.9 kg/m2 36.34 kg/m2        05/20/2016    3:15 PM  Foot/eye exam completion dates  Foot Form Completion Done    improved

## 2023-10-06 DIAGNOSIS — H524 Presbyopia: Secondary | ICD-10-CM | POA: Diagnosis not present

## 2023-10-14 ENCOUNTER — Encounter: Payer: Self-pay | Admitting: Family Medicine

## 2023-10-14 ENCOUNTER — Ambulatory Visit (INDEPENDENT_AMBULATORY_CARE_PROVIDER_SITE_OTHER): Payer: Medicare HMO | Admitting: Family Medicine

## 2023-10-14 VITALS — BP 128/82 | HR 75 | Ht 74.0 in | Wt 268.0 lb

## 2023-10-14 DIAGNOSIS — I10 Essential (primary) hypertension: Secondary | ICD-10-CM | POA: Diagnosis not present

## 2023-10-14 DIAGNOSIS — M109 Gout, unspecified: Secondary | ICD-10-CM

## 2023-10-14 DIAGNOSIS — I4891 Unspecified atrial fibrillation: Secondary | ICD-10-CM

## 2023-10-14 DIAGNOSIS — E785 Hyperlipidemia, unspecified: Secondary | ICD-10-CM | POA: Diagnosis not present

## 2023-10-14 DIAGNOSIS — R972 Elevated prostate specific antigen [PSA]: Secondary | ICD-10-CM

## 2023-10-14 DIAGNOSIS — D126 Benign neoplasm of colon, unspecified: Secondary | ICD-10-CM

## 2023-10-14 DIAGNOSIS — H9190 Unspecified hearing loss, unspecified ear: Secondary | ICD-10-CM | POA: Diagnosis not present

## 2023-10-14 DIAGNOSIS — N1831 Chronic kidney disease, stage 3a: Secondary | ICD-10-CM | POA: Diagnosis not present

## 2023-10-14 NOTE — Patient Instructions (Addendum)
Annual exam end January to mid February, call if you need me sooner  Return at 1 pm,today, Nurse will let you knwo if you need the labs today  It is important that you exercise regularly at least 30 minutes 5 times a week. If you develop chest pain, have severe difficulty breathing, or feel very tired, stop exercising immediately and seek medical attention

## 2023-10-15 LAB — CMP14+EGFR
ALT: 22 [IU]/L (ref 0–44)
AST: 21 [IU]/L (ref 0–40)
Albumin: 4.2 g/dL (ref 3.8–4.8)
Alkaline Phosphatase: 119 [IU]/L (ref 44–121)
BUN/Creatinine Ratio: 11 (ref 10–24)
BUN: 17 mg/dL (ref 8–27)
Bilirubin Total: 0.5 mg/dL (ref 0.0–1.2)
CO2: 24 mmol/L (ref 20–29)
Calcium: 9.5 mg/dL (ref 8.6–10.2)
Chloride: 104 mmol/L (ref 96–106)
Creatinine, Ser: 1.54 mg/dL — ABNORMAL HIGH (ref 0.76–1.27)
Globulin, Total: 2.4 g/dL (ref 1.5–4.5)
Glucose: 97 mg/dL (ref 70–99)
Potassium: 4.8 mmol/L (ref 3.5–5.2)
Sodium: 142 mmol/L (ref 134–144)
Total Protein: 6.6 g/dL (ref 6.0–8.5)
eGFR: 46 mL/min/{1.73_m2} — ABNORMAL LOW (ref >59)

## 2023-10-15 LAB — LIPID PANEL
Chol/HDL Ratio: 4 {ratio} (ref 0.0–5.0)
Cholesterol, Total: 197 mg/dL (ref 100–199)
HDL: 49 mg/dL (ref >39)
LDL Chol Calc (NIH): 124 mg/dL — ABNORMAL HIGH (ref 0–99)
Triglycerides: 135 mg/dL (ref 0–149)
VLDL Cholesterol Cal: 24 mg/dL (ref 5–40)

## 2023-10-15 LAB — URIC ACID: Uric Acid: 5.9 mg/dL (ref 3.8–8.4)

## 2023-10-16 NOTE — Assessment & Plan Note (Signed)
No recent flare and uric acid effectively controlled with current dose allopurinol

## 2023-10-16 NOTE — Assessment & Plan Note (Signed)
Persitent, rate controlled , refuses anticoagulation

## 2023-10-16 NOTE — Assessment & Plan Note (Addendum)
Mostt recent colonoscopy in 2012 GI , Dr Kendell Bane recommended against further colonoscopies unless symptomatic

## 2023-10-16 NOTE — Assessment & Plan Note (Signed)
No f/u per pt,

## 2023-10-16 NOTE — Assessment & Plan Note (Signed)
  Patient re-educated about  the importance of commitment to a  minimum of 150 minutes of exercise per week as able.  The importance of healthy food choices with portion control discussed, as well as eating regularly and within a 12 hour window most days. The need to choose "clean , green" food 50 to 75% of the time is discussed, as well as to make water the primary drink and set a goal of 64 ounces water daily.       10/14/2023   11:21 AM 08/09/2023   11:08 AM 04/19/2023    9:06 AM  Weight /BMI  Weight 268 lb 0.6 oz 267 lb 1.3 oz 264 lb  Height 6\' 2"  (1.88 m) 6\' 2"  (1.88 m) 6\' 2"  (1.88 m)  BMI 34.41 kg/m2 34.29 kg/m2 33.9 kg/m2

## 2023-10-16 NOTE — Assessment & Plan Note (Signed)
Recommend getting hearing aids through the [place]

## 2023-10-16 NOTE — Assessment & Plan Note (Signed)
Controlled, no change in medication DASH diet and commitment to daily physical activity for a minimum of 30 minutes discussed and encouraged, as a part of hypertension management. The importance of attaining a healthy weight is also discussed.     10/14/2023   12:00 PM 10/14/2023   11:21 AM 08/09/2023   11:10 AM 08/09/2023   11:08 AM 04/19/2023    9:36 AM 04/19/2023    9:06 AM 02/16/2023   10:26 AM  BP/Weight  Systolic BP 128 137 149 145 160 177 136  Diastolic BP 82 85 75 72 70 73 84  Wt. (Lbs)  268.04  267.08  264 283  BMI  34.41 kg/m2  34.29 kg/m2  33.9 kg/m2 36.34 kg/m2

## 2023-10-16 NOTE — Assessment & Plan Note (Signed)
Stable , needs to avoid NSAID and keep BP controlled

## 2023-10-16 NOTE — Progress Notes (Signed)
Carlos Leon     MRN: [id number]      DOB: 23-Aug-1946  Chief Complaint  Patient presents with   Follow-up    Bp follow up    HPI Carlos Leon is here for follow up and re-evaluation of chronic medical conditions, medication management and review of any available recent lab and radiology data.  Preventive health is updated, specifically  Cancer screening and Immunization.   Questions or concerns regarding consultations or procedures which the PT has had in the interim are  addressed. The PT denies any adverse reactions to current medications since the last visit.  There are no new concerns.  There are no specific complaints   ROS Denies recent fever or chills. Denies sinus pressure, nasal congestion, ear pain or sore throat. Denies chest congestion, productive cough or wheezing. Denies chest pains, palpitations and leg swelling Denies abdominal pain, nausea, vomiting,diarrhea or constipation.   Denies dysuria, frequency, hesitancy or incontinence. Denies joint pain, swelling and limitation in mobility. Denies headaches, seizures, numbness, or tingling. Denies depression, anxiety or insomnia. Denies skin break down or rash.   PE  BP 128/82   Pulse 75   Ht 6\' 2"  (1.88 m)   Wt 268 lb 0.6 oz (121.6 kg)   SpO2 95%   BMI 34.41 kg/m   Patient alert and oriented and in no cardiopulmonary distress.  HEENT: No facial asymmetry, EOMI,     Neck supple .  Chest: Clear to auscultation bilaterally.  CVS: S1, S2 no murmurs, no S3.Regular rate.  ABD: Soft non tender.   Ext: No edema  MS: Adequate ROM spine, shoulders, hips and knees.  Skin: Intact, no ulcerations or rash noted.  Psych: Good eye contact, normal affect. Memory intact not anxious or depressed appearing.  CNS: CN 2-12 intact, power,  normal throughout.no focal deficits noted.   Assessment & Plan  Essential hypertension, benign Controlled, no change in medication DASH diet and commitment to daily  physical activity for a minimum of 30 minutes discussed and encouraged, as a part of hypertension management. The importance of attaining a healthy weight is also discussed.     10/14/2023   12:00 PM 10/14/2023   11:21 AM 08/09/2023   11:10 AM 08/09/2023   11:08 AM 04/19/2023    9:36 AM 04/19/2023    9:06 AM 02/16/2023   10:26 AM  BP/Weight  Systolic BP 128 137 149 145 160 177 136  Diastolic BP 82 85 75 72 70 73 84  Wt. (Lbs)  268.04  267.08  264 283  BMI  34.41 kg/m2  34.29 kg/m2  33.9 kg/m2 36.34 kg/m2       Atrial fibrillation (HCC) Persitent, rate controlled , refuses anticoagulation  Elevated PSA No f/u per pt,   CKD (chronic kidney disease) stage 3, GFR 30-59 ml/min (HCC) Stable , needs to avoid NSAID and keep BP controlled  Gout, unspecified No recent flare and uric acid effectively controlled with current dose allopurinol  Hearing loss Recommend getting hearing aids through the [place]  Morbid obesity (HCC)  Patient re-educated about  the importance of commitment to a  minimum of 150 minutes of exercise per week as able.  The importance of healthy food choices with portion control discussed, as well as eating regularly and within a 12 hour window most days. The need to choose "clean , green" food 50 to 75% of the time is discussed, as well as to make water the primary drink and set a goal of 64  ounces water daily.       10/14/2023   11:21 AM 08/09/2023   11:08 AM 04/19/2023    9:06 AM  Weight /BMI  Weight 268 lb 0.6 oz 267 lb 1.3 oz 264 lb  Height 6\' 2"  (1.88 m) 6\' 2"  (1.88 m) 6\' 2"  (1.88 m)  BMI 34.41 kg/m2 34.29 kg/m2 33.9 kg/m2      Tubular adenoma of colon Mostt recent colonoscopy in 2012 GI , Carlos Leon recommended against further colonoscopies unless symptomatic

## 2024-02-01 ENCOUNTER — Encounter: Payer: Self-pay | Admitting: Family Medicine

## 2024-02-01 ENCOUNTER — Ambulatory Visit (INDEPENDENT_AMBULATORY_CARE_PROVIDER_SITE_OTHER): Payer: Medicare HMO | Admitting: Family Medicine

## 2024-02-01 VITALS — BP 128/82 | HR 68 | Ht 74.0 in | Wt 273.1 lb

## 2024-02-01 DIAGNOSIS — I10 Essential (primary) hypertension: Secondary | ICD-10-CM

## 2024-02-01 DIAGNOSIS — E785 Hyperlipidemia, unspecified: Secondary | ICD-10-CM

## 2024-02-01 DIAGNOSIS — E559 Vitamin D deficiency, unspecified: Secondary | ICD-10-CM

## 2024-02-01 DIAGNOSIS — Z0001 Encounter for general adult medical examination with abnormal findings: Secondary | ICD-10-CM | POA: Diagnosis not present

## 2024-02-01 DIAGNOSIS — R7303 Prediabetes: Secondary | ICD-10-CM | POA: Diagnosis not present

## 2024-02-01 NOTE — Patient Instructions (Addendum)
 F/u in 6 months, call if you need me sooner  Work on 10 pound weight loss  Continue regular exercise  Food choice is kEY to health  Fasting cBC, lipid, cmp and eGFr, hBA1C , TSH and vit D 3 to 5 days before next appt  Thanks for choosing [facility], we consider it a privelige to serve you.

## 2024-02-01 NOTE — Progress Notes (Signed)
   Carlos Leon     MRN: [id number]      DOB: 1946-10-30  Chief Complaint  Patient presents with   Annual Exam    CPE     HPI: Patient is in for annual physical exam. No other health concerns are expressed or addressed at the visit. Exercises at least 3 days / week, reports overeating which he intends to change Immunization is reviewed , and  refuses immunization, also no interest in cancer screening    PE; BP 128/82   Pulse 68   Ht 6' 2 (1.88 m)   Wt 273 lb 1.9 oz (123.9 kg)   SpO2 96%   BMI 35.07 kg/m   Pleasant male, alert and oriented x 3, in no cardio-pulmonary distress. Afebrile. HEENT No facial trauma or asymetry. Sinuses non tender. EOMI External ears normal,  Neck: supple, no adenopathy,JVD or thyromegaly.No bruits.  Chest: Clear to ascultation bilaterally.No crackles or wheezes. Non tender to palpation  Cardiovascular system; Heart sounds normal,  S1 and  S2 ,no S3.  No murmur, or thrill. Apical beat not displaced Irregularly , irregular puilse  Abdomen: Soft, non tender, no organomegaly or masses.   Musculoskeletal exam: Full ROM of spine, hips , shoulders and knees. No deformity ,swelling or crepitus noted. No muscle wasting or atrophy.   Neurologic: Cranial nerves 2 to 12 intact. Power, tone ,sensation and reflexes normal throughout. No disturbance in gait. No tremor.  Skin: Intact, no ulceration, erythema , scaling or rash noted. Pigmentation normal throughout  Psych; Normal mood and affect. Judgement and concentration normal   Assessment & Plan:  Encounter for Medicare annual examination with abnormal findings Annual exam as documented. Counseling done  re healthy lifestyle involving commitment to 150 minutes exercise per week, heart healthy diet, and attaining healthy weight.The importance of adequate sleep also discussed. Regular seat belt use and home safety, is also discussed. Changes in health habits are decided on by the  patient with goals and time frames  set for achieving them. Immunization and cancer screening needs are specifically addressed at this visit.

## 2024-02-01 NOTE — Assessment & Plan Note (Signed)

## 2024-02-05 ENCOUNTER — Other Ambulatory Visit: Payer: Self-pay | Admitting: Family Medicine

## 2024-03-20 ENCOUNTER — Ambulatory Visit (INDEPENDENT_AMBULATORY_CARE_PROVIDER_SITE_OTHER): Payer: Medicare HMO

## 2024-03-20 VITALS — BP 128/68 | Ht 74.0 in | Wt 262.0 lb

## 2024-03-20 DIAGNOSIS — R7303 Prediabetes: Secondary | ICD-10-CM | POA: Diagnosis not present

## 2024-03-20 DIAGNOSIS — Z0000 Encounter for general adult medical examination without abnormal findings: Secondary | ICD-10-CM | POA: Diagnosis not present

## 2024-03-20 NOTE — Progress Notes (Signed)
 Because this visit was a virtual/telehealth visit,  certain criteria was not obtained, such a blood pressure, CBG if applicable, and timed get up and go. Any medications not marked as "taking" were not mentioned during the medication reconciliation part of the visit. Any vitals not documented were not able to be obtained due to this being a telehealth visit or patient was unable to self-report a recent blood pressure reading due to a lack of equipment at home via telehealth. Vitals that have been documented are verbally provided by the patient.  Subjective:   Carlos Leon is a 78 y.o. who presents for a Medicare Wellness preventive visit.  Visit Complete: Virtual I connected with  Carlos Leon on 03/20/24 by a audio enabled telemedicine application and verified that I am speaking with the correct person using two identifiers.  Patient Location: Home  Provider Location: Home Office  I discussed the limitations of evaluation and management by telemedicine. The patient expressed understanding and agreed to proceed.  Vital Signs: Because this visit was a virtual/telehealth visit, some criteria may be missing or patient reported. Any vitals not documented were not able to be obtained and vitals that have been documented are patient reported.  VideoDeclined- This patient declined Librarian, academic. Therefore the visit was completed with audio only.  Persons Participating in Visit: Patient.  AWV Questionnaire: No: Patient Medicare AWV questionnaire was not completed prior to this visit.  Cardiac Risk Factors include: advanced age (>38men, >62 women);male gender;hypertension;dyslipidemia;obesity (BMI >30kg/m2)     Objective:    Today's Vitals   03/20/24 1302 03/20/24 1303  BP: 128/68   Weight: 262 lb (118.8 kg)   Height: 6\' 2"  (1.88 m)   PainSc:  0-No pain   Body mass index is 33.64 kg/m.     03/20/2024    1:10 PM 01/26/2023    8:30 AM 01/25/2022     2:39 PM 12/13/2021    9:48 PM 12/13/2021    2:07 PM 10/17/2021   10:22 PM 10/16/2021    9:09 AM  Advanced Directives  Does Patient Have a Medical Advance Directive? No No No No No No No  Would patient like information on creating a medical advance directive? No - Patient declined Yes (MAU/Ambulatory/Procedural Areas - Information given) No - Patient declined No - Patient declined No - Patient declined No - Patient declined     Current Medications (verified) Outpatient Encounter Medications as of 03/20/2024  Medication Sig   allopurinol (ZYLOPRIM) 300 MG tablet Take 1 tablet (300 mg total) by mouth daily.   amLODipine (NORVASC) 10 MG tablet Take 1 tablet (10 mg total) by mouth daily.   benazepril (LOTENSIN) 40 MG tablet TAKE 1 TABLET EVERY DAY   No facility-administered encounter medications on file as of 03/20/2024.    Allergies (verified) Chlorthalidone   History: Past Medical History:  Diagnosis Date   Arthritis    Atrial fibrillation (HCC)    Present by ECG 01/2012   Colon polyps    Diverticulosis of colon    Erectile dysfunction    Essential hypertension    Gout    Hyperlipidemia    Metabolic syndrome X 04/28/2014   Obesity    Past Surgical History:  Procedure Laterality Date   Colon polyps removed  2002   Dr. Katrinka Blazing   COLONOSCOPY  2004   Dr. Heloise Beecham prep, ACBE showed diverticulosis   COLONOSCOPY  11/24/2011   Procedure: COLONOSCOPY;  Surgeon: Corbin Ade, MD;  Location: [facility];  Service: Endoscopy;  Laterality: N/A;  9:10   COLONOSCOPY N/A 03/26/2020   Procedure: COLONOSCOPY;  Surgeon: Corbin Ade, MD;  Location: [facility];  Service: Endoscopy;  Laterality: N/A;  9:30   HERNIA REPAIR     PROSTATE BIOPSY     Family History  Problem Relation Age of Onset   Hypertension Mother    Cancer Mother        Rectal, approximately 34   Diabetes Father    Alzheimer's disease Father    Liver disease Neg Hx    Colon cancer Neg Hx    Social History    Socioeconomic History   Marital status: Married    Spouse name: Not on file   Number of children: 4   Years of education: 12   Highest education level: 12th grade  Occupational History   Occupation: Retired  Tobacco Use   Smoking status: Former    Current packs/day: 2.00    Average packs/day: 2.0 packs/day for 5.0 years (10.0 ttl pk-yrs)    Types: Cigarettes   Smokeless tobacco: Never  Vaping Use   Vaping status: Never Used  Substance and Sexual Activity   Alcohol use: No   Drug use: No   Sexual activity: Not on file  Other Topics Concern   Not on file  Social History Narrative   Lives with wife alone    Social Drivers of Health   Financial Resource Strain: Low Risk  (03/20/2024)   Overall Financial Resource Strain (CARDIA)    Difficulty of Paying Living Expenses: Not hard at all  Food Insecurity: No Food Insecurity (03/20/2024)   Hunger Vital Sign    Worried About Running Out of Food in the Last Year: Never true    Ran Out of Food in the Last Year: Never true  Transportation Needs: No Transportation Needs (03/20/2024)   PRAPARE - Administrator, Civil Service (Medical): No    Lack of Transportation (Non-Medical): No  Physical Activity: Insufficiently Active (03/20/2024)   Exercise Vital Sign    Days of Exercise per Week: 5 days    Minutes of Exercise per Session: 20 min  Stress: No Stress Concern Present (03/20/2024)   [employer] of Occupational Health - Occupational Stress Questionnaire    Feeling of Stress : Not at all  Social Connections: Socially Integrated (03/20/2024)   Social Connection and Isolation Panel [NHANES]    Frequency of Communication with Friends and Family: Three times a week    Frequency of Social Gatherings with Friends and Family: Once a week    Attends Religious Services: More than 4 times per year    Active Member of [employer] or Organizations: Yes    Attends Engineer, structural: More than 4 times per year    Marital  Status: Married    Tobacco Counseling Counseling given: Not Answered    Clinical Intake:  Pre-visit preparation completed: Yes  Pain Score: 0-No pain     BMI - recorded: 33.64 Nutritional Status: BMI > 30  Obese Nutritional Risks: None Diabetes: No  Lab Results  Component Value Date   HGBA1C 6.4 (H) 08/01/2023   HGBA1C 6.5 (H) 01/25/2023   HGBA1C 6.0 09/28/2022     How often do you need to have someone help you when you read instructions, pamphlets, or other written materials from your doctor or pharmacy?: 1 - Never  Interpreter Needed?: No  Information entered by :: [employer]   Activities of Daily Living  03/20/2024    1:08 PM  In your present state of health, do you have any difficulty performing the following activities:  Hearing? 0  Vision? 0  Difficulty concentrating or making decisions? 0  Walking or climbing stairs? 0  Dressing or bathing? 0  Doing errands, shopping? 0  Preparing Food and eating ? N  Using the Toilet? N  In the past six months, have you accidently leaked urine? Y  Do you have problems with loss of bowel control? N  Managing your Medications? N  Managing your Finances? N  Housekeeping or managing your Housekeeping? N    Patient Care Team: Kerri Perches, MD as PCP - General Diona Browner Illene Bolus, MD as PCP - Cardiology (Cardiology) Jena Gauss Gerrit Friends, MD as Consulting Physician (Gastroenterology)  Indicate any recent Medical Services you may have received from other than [facility] providers in the past year (date may be approximate).     Assessment:   This is a routine wellness examination for Carlos Leon.  Hearing/Vision screen Hearing Screening - Comments:: Has hearing aids Vision Screening - Comments:: Patient wear glasses   Goals Addressed               This Visit's Progress     Patient Stated (pt-stated)        Stay healthy       Depression Screen     03/20/2024    1:11 PM 02/01/2024   10:53 AM  10/14/2023   11:22 AM 08/09/2023   11:09 AM 01/26/2023    8:31 AM 01/25/2023    8:30 AM 09/28/2022    8:53 AM  PHQ 2/9 Scores  PHQ - 2 Score 0 0 0 0 0 0 0  PHQ- 9 Score 0          Fall Risk     03/20/2024    1:09 PM 02/01/2024   10:53 AM 10/14/2023   11:22 AM 08/09/2023   11:09 AM 01/26/2023    8:31 AM  Fall Risk   Falls in the past year? 0 0 0 0 0  Number falls in past yr: 0 0  0 0  Injury with Fall? 0 0 0 0 0  Risk for fall due to : No Fall Risks No Fall Risks No Fall Risks No Fall Risks   Follow up Falls evaluation completed Falls evaluation completed Falls evaluation completed Falls evaluation completed     MEDICARE RISK AT HOME:  Medicare Risk at Home Any stairs in or around the home?: No If so, are there any without handrails?: No Home free of loose throw rugs in walkways, pet beds, electrical cords, etc?: Yes Adequate lighting in your home to reduce risk of falls?: Yes Life alert?: No Use of a cane, walker or w/c?: No Grab bars in the bathroom?: No Shower chair or bench in shower?: No Elevated toilet seat or a handicapped toilet?: No  TIMED UP AND GO:  Was the test performed?  no   Cognitive Function: 6 CIT Completed    01/25/2022    2:40 PM  MMSE - Mini Mental State Exam  Not completed: Unable to complete        03/20/2024    1:04 PM 01/26/2023    8:32 AM 01/25/2022    2:40 PM 01/21/2021    8:38 AM 01/18/2020    8:07 AM  6CIT Screen  What Year? 0 points 0 points 0 points 0 points 0 points  What month? 0 points 0 points 0 points  0 points 0 points  What time? 0 points 0 points 0 points 0 points 0 points  Count back from 20 0 points 0 points 0 points 0 points 0 points  Months in reverse 0 points 0 points 0 points 0 points 0 points  Repeat phrase 2 points 6 points 0 points 0 points 0 points  Total Score 2 points 6 points 0 points 0 points 0 points    Immunizations Immunization History  Administered Date(s) Administered   Influenza-Unspecified 03/16/2007    PFIZER(Purple Top)SARS-COV-2 Vaccination 01/31/2020, 02/21/2020, 10/31/2020   Pneumococcal Conjugate-13 08/20/2014   Pneumococcal Polysaccharide-23 02/07/2012   Tdap 02/07/2012    Screening Tests Health Maintenance  Topic Date Due   HEMOGLOBIN A1C  02/01/2024   INFLUENZA VACCINE  03/26/2024 (Originally 07/28/2023)   Zoster Vaccines- Shingrix (1 of 2) 04/30/2024 (Originally 09/16/1965)   DTaP/Tdap/Td (2 - Td or Tdap) 01/31/2025 (Originally 02/06/2022)   COVID-19 Vaccine (4 - 2024-25 season) 02/21/2025 (Originally 08/28/2023)   Diabetic kidney evaluation - Urine ACR  10/05/2027 (Originally 10/23/2009)   Diabetic kidney evaluation - eGFR measurement  10/13/2024   Medicare Annual Wellness (AWV)  03/20/2025   Pneumonia Vaccine 99+ Years old  Completed   Hepatitis C Screening  Completed   HPV VACCINES  Aged Out   Colonoscopy  Discontinued    Health Maintenance  Health Maintenance Due  Topic Date Due   HEMOGLOBIN A1C  02/01/2024   Health Maintenance Items Addressed: A1C  Additional Screening:  Vision Screening: Recommended annual ophthalmology exams for early detection of glaucoma and other disorders of the eye.  Dental Screening: Recommended annual dental exams for proper oral hygiene  Community Resource Referral / Chronic Care Management: CRR required this visit?  No   CCM required this visit?  No     Plan:     I have personally reviewed and noted the following in the patient's chart:   Medical and social history Use of alcohol, tobacco or illicit drugs  Current medications and supplements including opioid prescriptions. Patient is not currently taking opioid prescriptions. Functional ability and status Nutritional status Physical activity Advanced directives List of other physicians Hospitalizations, surgeries, and ER visits in previous 12 months Vitals Screenings to include cognitive, depression, and falls Referrals and appointments  In addition, I have reviewed  and discussed with patient certain preventive protocols, quality metrics, and best practice recommendations. A written personalized care plan for preventive services as well as general preventive health recommendations were provided to patient.     Rudi Heap, [place]   03/20/2024   After Visit Summary: (MyChart) Due to this being a telephonic visit, the after visit summary with patients personalized plan was offered to patient via MyChart   Notes: Nothing significant to report at this time.

## 2024-03-20 NOTE — Patient Instructions (Signed)
 Mr. General , Thank you for taking time to come for your Medicare Wellness Visit. I appreciate your ongoing commitment to your health goals. Please review the following plan we discussed and let me know if I can assist you in the future.   Referrals/Orders/Follow-Ups/Clinician Recommendations: Follow up in 1 year. A1c ordered  This is a list of the screening recommended for you and due dates:  Health Maintenance  Topic Date Due   Hemoglobin A1C  02/01/2024   Flu Shot  03/26/2024*   Zoster (Shingles) Vaccine (1 of 2) 04/30/2024*   DTaP/Tdap/Td vaccine (2 - Td or Tdap) 01/31/2025*   COVID-19 Vaccine (4 - 2024-25 season) 02/21/2025*   Yearly kidney health urinalysis for diabetes  10/05/2027*   Yearly kidney function blood test for diabetes  10/13/2024   Medicare Annual Wellness Visit  03/20/2025   Pneumonia Vaccine  Completed   Hepatitis C Screening  Completed   HPV Vaccine  Aged Out   Colon Cancer Screening  Discontinued  *Topic was postponed. The date shown is not the original due date.    Advanced directives: (Declined) Advance directive discussed with you today. Even though you declined this today, please call our office should you change your mind, and we can give you the proper paperwork for you to fill out.  Next Medicare Annual Wellness Visit scheduled for next year: Yes

## 2024-04-09 ENCOUNTER — Telehealth: Payer: Self-pay | Admitting: Family Medicine

## 2024-04-09 NOTE — Telephone Encounter (Signed)
 Patient needing refill on allopurinol (ZYLOPRIM) 300 MG tablet [[id number]] sent to [facility] in [place]. Please advise Thank you

## 2024-04-10 ENCOUNTER — Other Ambulatory Visit: Payer: Self-pay

## 2024-04-10 MED ORDER — ALLOPURINOL 300 MG PO TABS: 300.0000 mg | ORAL_TABLET | Freq: Every day | ORAL | 0 refills | Status: DC

## 2024-04-10 NOTE — Telephone Encounter (Signed)
 Refills sent to pharmacy.

## 2024-07-12 ENCOUNTER — Other Ambulatory Visit: Payer: Self-pay

## 2024-07-12 ENCOUNTER — Telehealth: Payer: Self-pay | Admitting: Family Medicine

## 2024-07-12 MED ORDER — AMLODIPINE BESYLATE 10 MG PO TABS: 10.0000 mg | ORAL_TABLET | Freq: Every day | ORAL | 2 refills | Status: DC

## 2024-07-12 NOTE — Telephone Encounter (Signed)
 Prescription Request  07/12/2024  LOV: 02/01/2024  What is the name of the medication or equipment? amLODipine  (NORVASC ) 10 MG tablet [[id number]]   Have you contacted your pharmacy to request a refill? Yes   Which pharmacy would you like this sent to?  [facility]  Patient notified that their request is being sent to the clinical staff for review and that they should receive a response within 2 business days.   Please advise at:  the patient walked into office

## 2024-07-12 NOTE — Telephone Encounter (Signed)
 Sent!

## 2024-07-31 ENCOUNTER — Encounter: Payer: Self-pay | Admitting: Family Medicine

## 2024-07-31 ENCOUNTER — Ambulatory Visit (INDEPENDENT_AMBULATORY_CARE_PROVIDER_SITE_OTHER): Payer: Medicare HMO | Admitting: Family Medicine

## 2024-07-31 VITALS — BP 150/86 | HR 68 | Resp 16 | Ht 74.0 in | Wt 269.1 lb

## 2024-07-31 DIAGNOSIS — R7303 Prediabetes: Secondary | ICD-10-CM | POA: Diagnosis not present

## 2024-07-31 DIAGNOSIS — E559 Vitamin D deficiency, unspecified: Secondary | ICD-10-CM

## 2024-07-31 DIAGNOSIS — I10 Essential (primary) hypertension: Secondary | ICD-10-CM

## 2024-07-31 DIAGNOSIS — M109 Gout, unspecified: Secondary | ICD-10-CM

## 2024-07-31 DIAGNOSIS — N189 Chronic kidney disease, unspecified: Secondary | ICD-10-CM | POA: Diagnosis not present

## 2024-07-31 DIAGNOSIS — E785 Hyperlipidemia, unspecified: Secondary | ICD-10-CM | POA: Diagnosis not present

## 2024-07-31 DIAGNOSIS — D631 Anemia in chronic kidney disease: Secondary | ICD-10-CM

## 2024-07-31 MED ORDER — CARVEDILOL 3.125 MG PO TABS: 3.1250 mg | ORAL_TABLET | Freq: Two times a day (BID) | ORAL | 4 refills | Status: DC

## 2024-07-31 NOTE — Patient Instructions (Signed)
 F/u MID NOVEMBER  NURSE VISIT FOR BP RE EVAL IN 3 WEEKS  NEW ADDITIONAL MEDICATION FOR BLOOD PRESSURE IS CARVEDILOL  3.125 MG ONE TWO TIMES DAILY, 9 AM AND 9 PM, CONTINUE YOUR OTHER MEDICATIONS AS BEFORE  START SWIMMING!  LABS ORDERED IN FEB  TODAY, NURSE PLEASE ADD URIC ACID LEVEL  CONTINUE TO CUT BACK ON QUANTITY OF FOOD AND CHOOSE HEALTHY!  Thanks for choosing [facility], we consider it a privelige to serve you.

## 2024-08-01 ENCOUNTER — Ambulatory Visit: Payer: Self-pay | Admitting: Family Medicine

## 2024-08-01 LAB — URIC ACID: Uric Acid: 7.7 mg/dL (ref 3.8–8.4)

## 2024-08-01 LAB — CMP14+EGFR
ALT: 18 IU/L (ref 0–44)
AST: 19 IU/L (ref 0–40)
Albumin: 4.3 g/dL (ref 3.8–4.8)
Alkaline Phosphatase: 123 IU/L — ABNORMAL HIGH (ref 44–121)
BUN/Creatinine Ratio: 14 (ref 10–24)
BUN: 21 mg/dL (ref 8–27)
Bilirubin Total: 0.5 mg/dL (ref 0.0–1.2)
CO2: 19 mmol/L — ABNORMAL LOW (ref 20–29)
Calcium: 9.6 mg/dL (ref 8.6–10.2)
Chloride: 106 mmol/L (ref 96–106)
Creatinine, Ser: 1.47 mg/dL — ABNORMAL HIGH (ref 0.76–1.27)
Globulin, Total: 2.3 g/dL (ref 1.5–4.5)
Glucose: 103 mg/dL — ABNORMAL HIGH (ref 70–99)
Potassium: 5 mmol/L (ref 3.5–5.2)
Sodium: 141 mmol/L (ref 134–144)
Total Protein: 6.6 g/dL (ref 6.0–8.5)
eGFR: 49 mL/min/1.73 — ABNORMAL LOW (ref >59)

## 2024-08-01 LAB — LIPID PANEL
Chol/HDL Ratio: 3.9 ratio (ref 0.0–5.0)
Cholesterol, Total: 211 mg/dL — ABNORMAL HIGH (ref 100–199)
HDL: 54 mg/dL (ref >39)
LDL Chol Calc (NIH): 143 mg/dL — ABNORMAL HIGH (ref 0–99)
Triglycerides: 80 mg/dL (ref 0–149)
VLDL Cholesterol Cal: 14 mg/dL (ref 5–40)

## 2024-08-01 LAB — HEMOGLOBIN A1C
Est. average glucose Bld gHb Est-mCnc: 137 mg/dL
Hgb A1c MFr Bld: 6.4 % — ABNORMAL HIGH (ref 4.8–5.6)

## 2024-08-01 LAB — CBC WITH DIFFERENTIAL/PLATELET
Basophils Absolute: 0.1 x10E3/uL (ref 0.0–0.2)
Basos: 1 %
EOS (ABSOLUTE): 0.2 x10E3/uL (ref 0.0–0.4)
Eos: 3 %
Hematocrit: 42.9 % (ref 37.5–51.0)
Hemoglobin: 13.7 g/dL (ref 13.0–17.7)
Immature Grans (Abs): 0 x10E3/uL (ref 0.0–0.1)
Immature Granulocytes: 0 %
Lymphocytes Absolute: 1.6 x10E3/uL (ref 0.7–3.1)
Lymphs: 20 %
MCH: 26.5 pg — ABNORMAL LOW (ref 26.6–33.0)
MCHC: 31.9 g/dL (ref 31.5–35.7)
MCV: 83 fL (ref 79–97)
Monocytes Absolute: 0.5 x10E3/uL (ref 0.1–0.9)
Monocytes: 7 %
Neutrophils Absolute: 5.5 x10E3/uL (ref 1.4–7.0)
Neutrophils: 68 %
Platelets: 181 x10E3/uL (ref 150–450)
RBC: 5.17 x10E6/uL (ref 4.14–5.80)
RDW: 15.7 % — ABNORMAL HIGH (ref 11.6–15.4)
WBC: 7.9 x10E3/uL (ref 3.4–10.8)

## 2024-08-01 LAB — TSH: TSH: 1 u[IU]/mL (ref 0.450–4.500)

## 2024-08-01 LAB — VITAMIN D 25 HYDROXY (VIT D DEFICIENCY, FRACTURES): Vit D, 25-Hydroxy: 26.6 ng/mL — ABNORMAL LOW (ref 30.0–100.0)

## 2024-08-20 ENCOUNTER — Encounter: Payer: Self-pay | Admitting: Family Medicine

## 2024-08-20 DIAGNOSIS — E559 Vitamin D deficiency, unspecified: Secondary | ICD-10-CM | POA: Insufficient documentation

## 2024-08-20 NOTE — Assessment & Plan Note (Signed)
  Patient re-educated about  the importance of commitment to a  minimum of 150 minutes of exercise per week as able.  The importance of healthy food choices with portion control discussed, as well as eating regularly and within a 12 hour window most days. The need to choose clean , green food 50 to 75% of the time is discussed, as well as to make water the primary drink and set a goal of 64 ounces water daily.       07/31/2024   10:36 AM 03/20/2024    1:02 PM 02/01/2024   10:53 AM  Weight /BMI  Weight 269 lb 1.3 oz 262 lb 273 lb 1.9 oz  Height 6' 2 (1.88 m) 6' 2 (1.88 m) 6' 2 (1.88 m)  BMI 34.55 kg/m2 33.64 kg/m2 35.07 kg/m2    Deteriorated

## 2024-08-20 NOTE — Assessment & Plan Note (Signed)
 Updated lab needed at/ before next visit.

## 2024-08-20 NOTE — Assessment & Plan Note (Signed)
 Uric acid high ,needs to lower foods that elevate level and commit to allopurinol  daily, does not report recent flares

## 2024-08-20 NOTE — Assessment & Plan Note (Signed)
 Patient educated about the importance of limiting  Carbohydrate intake , the need to commit to daily physical activity for a minimum of 30 minutes , and to commit weight loss. The fact that changes in all these areas will reduce or eliminate all together the development of diabetes is stressed.      Latest Ref Rng & Units 07/31/2024   11:15 AM 10/14/2023    1:02 PM 08/01/2023    9:29 AM 01/25/2023    9:53 AM 09/28/2022    9:39 AM  Diabetic Labs  HbA1c 4.8 - 5.6 % 6.4   6.4  6.5  6.0   Chol 100 - 199 mg/dL 788  802   786    HDL >60 mg/dL 54  49   51    Calc LDL 0 - 99 mg/dL 856  875   861    Triglycerides 0 - 149 mg/dL 80  864   862    Creatinine 0.76 - 1.27 mg/dL 8.52  8.45   8.50        07/31/2024   10:57 AM 07/31/2024   10:36 AM 03/20/2024    1:02 PM 02/01/2024   11:42 AM 02/01/2024   10:54 AM 02/01/2024   10:53 AM 10/14/2023   12:00 PM  BP/Weight  Systolic BP 150 157 128 128 144 148 128  Diastolic BP 86 84 68 82 83 83 82  Wt. (Lbs)  269.08 262   273.12   BMI  34.55 kg/m2 33.64 kg/m2   35.07 kg/m2       05/20/2016    3:15 PM  Foot/eye exam completion dates  Foot Form Completion Done    Controled with diet, stable

## 2024-08-20 NOTE — Progress Notes (Signed)
 Carlos Leon     MRN: [id number]      DOB: [birth year]  Chief Complaint  Patient presents with   Hypertension    6 month follow up, has been running good at home     HPI Carlos Leon is here for follow up and re-evaluation of chronic medical conditions, medication management and review of any available recent lab and radiology data.  Preventive health is updated, specifically  Cancer screening and Immunization.   Questions or concerns regarding consultations or procedures which the PT has had in the interim are  addressed. The PT denies any adverse reactions to current medications since the last visit.  There are no new concerns.  There are no specific complaints   ROS Denies recent fever or chills. Denies sinus pressure, nasal congestion, ear pain or sore throat. Denies chest congestion, productive cough or wheezing. Denies chest pains, palpitations and leg swelling Denies abdominal pain, nausea, vomiting,diarrhea or constipation.   Denies dysuria, frequency, hesitancy or incontinence. Denies joint pain, swelling and limitation in mobility. Denies headaches, seizures, numbness, or tingling. Denies depression, anxiety or insomnia. Denies skin break down or rash.   PE  BP (!) 150/86   Pulse 68   Resp 16   Ht 6' 2 (1.88 m)   Wt 269 lb 1.3 oz (122.1 kg)   SpO2 97%   BMI 34.55 kg/m   Patient alert and oriented and in no cardiopulmonary distress.  HEENT: No facial asymmetry, EOMI,     Neck supple .  Chest: Clear to auscultation bilaterally.  CVS: S1, S2 no murmurs, no S3.Regular rate.  ABD: Soft non tender.   Ext: No edema  MS: Adequate ROM spine, shoulders, hips and knees.  Skin: Intact, no ulcerations or rash noted.  Psych: Good eye contact, normal affect. Memory intact not anxious or depressed appearing.  CNS: CN 2-12 intact, power,  normal throughout.no focal deficits noted.   Assessment & Plan  Essential hypertension, benign Uncontrolled , add  coreg  twice daily nurse BP check in 3 weeks DASH diet and commitment to daily physical activity for a minimum of 30 minutes discussed and encouraged, as a part of hypertension management. The importance of attaining a healthy weight is also discussed.     07/31/2024   10:57 AM 07/31/2024   10:36 AM 03/20/2024    1:02 PM 02/01/2024   11:42 AM 02/01/2024   10:54 AM 02/01/2024   10:53 AM 10/14/2023   12:00 PM  BP/Weight  Systolic BP 150 157 128 128 144 148 128  Diastolic BP 86 84 68 82 83 83 82  Wt. (Lbs)  269.08 262   273.12   BMI  34.55 kg/m2 33.64 kg/m2   35.07 kg/m2        Hyperlipemia Hyperlipidemia:Low fat diet discussed and encouraged.   Lipid Panel  Lab Results  Component Value Date   CHOL 211 (H) 07/31/2024   HDL 54 07/31/2024   LDLCALC 143 (H) 07/31/2024   LDLDIRECT 125 05/20/2016   TRIG 80 07/31/2024   CHOLHDL 3.9 07/31/2024     Needs to reduce fat intake, no at goal  Gout, unspecified Uric acid high ,needs to lower foods that elevate level and commit to allopurinol  daily, does not report recent flares  Morbid obesity (HCC)  Patient re-educated about  the importance of commitment to a  minimum of 150 minutes of exercise per week as able.  The importance of healthy food choices with portion control discussed, as well as eating  regularly and within a 12 hour window most days. The need to choose clean , green food 50 to 75% of the time is discussed, as well as to make water the primary drink and set a goal of 64 ounces water daily.       07/31/2024   10:36 AM 03/20/2024    1:02 PM 02/01/2024   10:53 AM  Weight /BMI  Weight 269 lb 1.3 oz 262 lb 273 lb 1.9 oz  Height 6' 2 (1.88 m) 6' 2 (1.88 m) 6' 2 (1.88 m)  BMI 34.55 kg/m2 33.64 kg/m2 35.07 kg/m2    Deteriorated   Prediabetes Patient educated about the importance of limiting  Carbohydrate intake , the need to commit to daily physical activity for a minimum of 30 minutes , and to commit weight loss. The  fact that changes in all these areas will reduce or eliminate all together the development of diabetes is stressed.      Latest Ref Rng & Units 07/31/2024   11:15 AM 10/14/2023    1:02 PM 08/01/2023    9:29 AM 01/25/2023    9:53 AM 09/28/2022    9:39 AM  Diabetic Labs  HbA1c 4.8 - 5.6 % 6.4   6.4  6.5  6.0   Chol 100 - 199 mg/dL 788  802   786    HDL >60 mg/dL 54  49   51    Calc LDL 0 - 99 mg/dL 856  875   861    Triglycerides 0 - 149 mg/dL 80  864   862    Creatinine 0.76 - 1.27 mg/dL 8.52  8.45   8.50        07/31/2024   10:57 AM 07/31/2024   10:36 AM 03/20/2024    1:02 PM 02/01/2024   11:42 AM 02/01/2024   10:54 AM 02/01/2024   10:53 AM 10/14/2023   12:00 PM  BP/Weight  Systolic BP 150 157 128 128 144 148 128  Diastolic BP 86 84 68 82 83 83 82  Wt. (Lbs)  269.08 262   273.12   BMI  34.55 kg/m2 33.64 kg/m2   35.07 kg/m2       05/20/2016    3:15 PM  Foot/eye exam completion dates  Foot Form Completion Done    Controled with diet, stable  Anemia Updated lab needed at/ before next visit.   Vitamin D  deficiency OTC supplementation with 2000 units daily adequate

## 2024-08-20 NOTE — Assessment & Plan Note (Signed)
 OTC supplementation with 2000 units daily adequate

## 2024-08-20 NOTE — Assessment & Plan Note (Signed)
 Hyperlipidemia:Low fat diet discussed and encouraged.   Lipid Panel  Lab Results  Component Value Date   CHOL 211 (H) 07/31/2024   HDL 54 07/31/2024   LDLCALC 143 (H) 07/31/2024   LDLDIRECT 125 05/20/2016   TRIG 80 07/31/2024   CHOLHDL 3.9 07/31/2024     Needs to reduce fat intake, no at goal

## 2024-08-20 NOTE — Assessment & Plan Note (Signed)
 Uncontrolled , add coreg  twice daily nurse BP check in 3 weeks DASH diet and commitment to daily physical activity for a minimum of 30 minutes discussed and encouraged, as a part of hypertension management. The importance of attaining a healthy weight is also discussed.     07/31/2024   10:57 AM 07/31/2024   10:36 AM 03/20/2024    1:02 PM 02/01/2024   11:42 AM 02/01/2024   10:54 AM 02/01/2024   10:53 AM 10/14/2023   12:00 PM  BP/Weight  Systolic BP 150 157 128 128 144 148 128  Diastolic BP 86 84 68 82 83 83 82  Wt. (Lbs)  269.08 262   273.12   BMI  34.55 kg/m2 33.64 kg/m2   35.07 kg/m2

## 2024-08-21 ENCOUNTER — Ambulatory Visit

## 2024-08-21 DIAGNOSIS — H52223 Regular astigmatism, bilateral: Secondary | ICD-10-CM | POA: Diagnosis not present

## 2024-10-15 ENCOUNTER — Telehealth: Payer: Self-pay

## 2024-10-15 NOTE — Telephone Encounter (Signed)
 Prescription Request  10/15/2024  LOV: Visit date not found  What is the name of the medication or equipment? amLODipine  (NORVASC ) 10 MG tablet allopurinol  (ZYLOPRIM ) 300 MG tablet   Have you contacted your pharmacy to request a refill? Yes   Which pharmacy would you like this sent to?  [facility]     Patient notified that their request is being sent to the clinical staff for review and that they should receive a response within 2 business days.   Please advise at Mobile [phone] (mobile)

## 2024-10-17 ENCOUNTER — Other Ambulatory Visit: Payer: Self-pay | Admitting: Family Medicine

## 2024-10-22 NOTE — Telephone Encounter (Signed)
 Refills sent

## 2024-11-01 ENCOUNTER — Ambulatory Visit (INDEPENDENT_AMBULATORY_CARE_PROVIDER_SITE_OTHER): Admitting: Family Medicine

## 2024-11-01 ENCOUNTER — Encounter: Payer: Self-pay | Admitting: Family Medicine

## 2024-11-01 VITALS — BP 152/80 | HR 81 | Resp 16 | Ht 74.0 in | Wt 268.0 lb

## 2024-11-01 DIAGNOSIS — R7303 Prediabetes: Secondary | ICD-10-CM | POA: Diagnosis not present

## 2024-11-01 DIAGNOSIS — I4811 Longstanding persistent atrial fibrillation: Secondary | ICD-10-CM | POA: Diagnosis not present

## 2024-11-01 DIAGNOSIS — D649 Anemia, unspecified: Secondary | ICD-10-CM

## 2024-11-01 DIAGNOSIS — I10 Essential (primary) hypertension: Secondary | ICD-10-CM | POA: Diagnosis not present

## 2024-11-01 DIAGNOSIS — E785 Hyperlipidemia, unspecified: Secondary | ICD-10-CM | POA: Diagnosis not present

## 2024-11-01 DIAGNOSIS — R972 Elevated prostate specific antigen [PSA]: Secondary | ICD-10-CM

## 2024-11-01 DIAGNOSIS — Z2882 Immunization not carried out because of caregiver refusal: Secondary | ICD-10-CM

## 2024-11-01 NOTE — Patient Instructions (Addendum)
 Annual exam in 18 weeks  PLEASE stop using salt, Carlos Leon is an alternative  Eat from the edges of the grocery store, vegetables, fruit, fresh or frozen, not canned, no snacks except unsalted popcor,n and eat health meats  Fasting lipid, cmp and EGFR, cBC and hBA1C 3 to 5 days before next appointment  It is important that you exercise regularly at least 30 minutes 5 times a week. If you develop chest pain, have severe difficulty breathing, or feel very tired, stop exercising immediately and seek medical attention   Thanks for choosing [facility], we consider it a privelige to serve you.

## 2024-11-05 ENCOUNTER — Encounter: Payer: Self-pay | Admitting: Family Medicine

## 2024-11-05 DIAGNOSIS — Z2882 Immunization not carried out because of caregiver refusal: Secondary | ICD-10-CM | POA: Insufficient documentation

## 2024-11-05 NOTE — Assessment & Plan Note (Signed)
 Patient educated about the importance of limiting  Carbohydrate intake , the need to commit to daily physical activity for a minimum of 30 minutes , and to commit weight loss. The fact that changes in all these areas will reduce or eliminate all together the development of diabetes is stressed.      Latest Ref Rng & Units 07/31/2024   11:15 AM 10/14/2023    1:02 PM 08/01/2023    9:29 AM 01/25/2023    9:53 AM 09/28/2022    9:39 AM  Diabetic Labs  HbA1c 4.8 - 5.6 % 6.4   6.4  6.5  6.0   Chol 100 - 199 mg/dL 788  802   786    HDL >60 mg/dL 54  49   51    Calc LDL 0 - 99 mg/dL 856  875   861    Triglycerides 0 - 149 mg/dL 80  864   862    Creatinine 0.76 - 1.27 mg/dL 8.52  8.45   8.50        11/01/2024   11:43 AM 11/01/2024   11:12 AM 07/31/2024   10:57 AM 07/31/2024   10:36 AM 03/20/2024    1:02 PM 02/01/2024   11:42 AM 02/01/2024   10:54 AM  BP/Weight  Systolic BP 152 149 150 157 128 128 144  Diastolic BP 80 80 86 84 68 82 83  Wt. (Lbs)  268.04  269.08 262    BMI  34.41 kg/m2  34.55 kg/m2 33.64 kg/m2        05/20/2016    3:15 PM  Foot/eye exam completion dates  Foot Form Completion Done    Unchanged Updated lab needed at/ before next visit.

## 2024-11-05 NOTE — Assessment & Plan Note (Signed)
 Updated lab needed at/ before next visit.

## 2024-11-05 NOTE — Assessment & Plan Note (Signed)
 Uncontrolled , non compliant with regime, reports will work on lifestyle, takes 2 meds regulalrly DASH diet and commitment to daily physical activity for a minimum of 30 minutes discussed and encouraged, as a part of hypertension management. The importance of attaining a healthy weight is also discussed.     11/01/2024   11:43 AM 11/01/2024   11:12 AM 07/31/2024   10:57 AM 07/31/2024   10:36 AM 03/20/2024    1:02 PM 02/01/2024   11:42 AM 02/01/2024   10:54 AM  BP/Weight  Systolic BP 152 149 150 157 128 128 144  Diastolic BP 80 80 86 84 68 82 83  Wt. (Lbs)  268.04  269.08 262    BMI  34.41 kg/m2  34.55 kg/m2 33.64 kg/m2

## 2024-11-05 NOTE — Assessment & Plan Note (Signed)
 Negative bx in the past , no additional pSA testing at this time per pt preference

## 2024-11-05 NOTE — Assessment & Plan Note (Signed)
 Re educated and encouraged to take recommended vaccines, again declines all

## 2024-11-05 NOTE — Assessment & Plan Note (Signed)
 Hyperlipidemia:Low fat diet discussed and encouraged.   Lipid Panel  Lab Results  Component Value Date   CHOL 211 (H) 07/31/2024   HDL 54 07/31/2024   LDLCALC 143 (H) 07/31/2024   LDLDIRECT 125 05/20/2016   TRIG 80 07/31/2024   CHOLHDL 3.9 07/31/2024     Updated lab needed at/ before next visit.

## 2024-11-05 NOTE — Progress Notes (Signed)
 Carlos Leon     MRN: [id number]      DOB: February 02, 1946  Chief Complaint  Patient presents with   Hypertension    Follow up    Sore    Pt describes a sore on his left hip x4 years that is becoming more painful. Hurts to touch and lay on     HPI Carlos Leon is here for follow up and re-evaluation of chronic medical conditions, medication management and review of any available recent lab and radiology data.  Preventive health is updated, specifically  Cancer screening and Immunization.   Questions or concerns regarding consultations or procedures which the PT has had in the interim are  addressed. The PT denies any adverse reactions to current medications since the last visit.   ROS Denies recent fever or chills. Denies sinus pressure, nasal congestion, ear pain or sore throat. Denies chest congestion, productive cough or wheezing. Denies chest pains, palpitations and leg swelling Denies abdominal pain, nausea, vomiting,diarrhea or constipation.   Denies dysuria, frequency, hesitancy or incontinence. Denies joint pain, swelling and limitation in mobility. Denies headaches, seizures, numbness, or tingling. Denies depression, anxiety or insomnia. Denies skin break down or rash.   PE  BP (!) 152/80   Pulse 81   Resp 16   Ht 6' 2 (1.88 m)   Wt 268 lb 0.6 oz (121.6 kg)   SpO2 98%   BMI 34.41 kg/m   Patient alert and oriented and in no cardiopulmonary distress.  HEENT: No facial asymmetry, EOMI,     Neck supple .  Chest: Clear to auscultation bilaterally.  CVS: S1, S2 no murmurs, no S3.IrRegular rate.  ABD: Soft non tender.   Ext: No edema  MS: Adequate ROM spine, shoulders, hips and knees.  Skin: Intact, no ulcerations or rash noted.  Psych: Good eye contact, normal affect. Memory intact not anxious or depressed appearing.  CNS: CN 2-12 intact, power,  normal throughout.no focal deficits noted.   Assessment & Plan  Essential hypertension,  benign Uncontrolled , non compliant with regime, reports will work on lifestyle, takes 2 meds regulalrly DASH diet and commitment to daily physical activity for a minimum of 30 minutes discussed and encouraged, as a part of hypertension management. The importance of attaining a healthy weight is also discussed.     11/01/2024   11:43 AM 11/01/2024   11:12 AM 07/31/2024   10:57 AM 07/31/2024   10:36 AM 03/20/2024    1:02 PM 02/01/2024   11:42 AM 02/01/2024   10:54 AM  BP/Weight  Systolic BP 152 149 150 157 128 128 144  Diastolic BP 80 80 86 84 68 82 83  Wt. (Lbs)  268.04  269.08 262    BMI  34.41 kg/m2  34.55 kg/m2 33.64 kg/m2         Morbid obesity (HCC)  Patient re-educated about  the importance of commitment to a  minimum of 150 minutes of exercise per week as able.  The importance of healthy food choices with portion control discussed, as well as eating regularly and within a 12 hour window most days. The need to choose clean , green food 50 to 75% of the time is discussed, as well as to make water the primary drink and set a goal of 64 ounces water daily.       11/01/2024   11:12 AM 07/31/2024   10:36 AM 03/20/2024    1:02 PM  Weight /BMI  Weight 268 lb 0.6 oz 269  lb 1.3 oz 262 lb  Height 6' 2 (1.88 m) 6' 2 (1.88 m) 6' 2 (1.88 m)  BMI 34.41 kg/m2 34.55 kg/m2 33.64 kg/m2    unchanged  Prediabetes Patient educated about the importance of limiting  Carbohydrate intake , the need to commit to daily physical activity for a minimum of 30 minutes , and to commit weight loss. The fact that changes in all these areas will reduce or eliminate all together the development of diabetes is stressed.      Latest Ref Rng & Units 07/31/2024   11:15 AM 10/14/2023    1:02 PM 08/01/2023    9:29 AM 01/25/2023    9:53 AM 09/28/2022    9:39 AM  Diabetic Labs  HbA1c 4.8 - 5.6 % 6.4   6.4  6.5  6.0   Chol 100 - 199 mg/dL 788  802   786    HDL >60 mg/dL 54  49   51    Calc LDL 0 - 99 mg/dL 856   875   861    Triglycerides 0 - 149 mg/dL 80  864   862    Creatinine 0.76 - 1.27 mg/dL 8.52  8.45   8.50        11/01/2024   11:43 AM 11/01/2024   11:12 AM 07/31/2024   10:57 AM 07/31/2024   10:36 AM 03/20/2024    1:02 PM 02/01/2024   11:42 AM 02/01/2024   10:54 AM  BP/Weight  Systolic BP 152 149 150 157 128 128 144  Diastolic BP 80 80 86 84 68 82 83  Wt. (Lbs)  268.04  269.08 262    BMI  34.41 kg/m2  34.55 kg/m2 33.64 kg/m2        05/20/2016    3:15 PM  Foot/eye exam completion dates  Foot Form Completion Done    Unchanged Updated lab needed at/ before next visit.   Hyperlipemia Hyperlipidemia:Low fat diet discussed and encouraged.   Lipid Panel  Lab Results  Component Value Date   CHOL 211 (H) 07/31/2024   HDL 54 07/31/2024   LDLCALC 143 (H) 07/31/2024   LDLDIRECT 125 05/20/2016   TRIG 80 07/31/2024   CHOLHDL 3.9 07/31/2024     Updated lab needed at/ before next visit.   Anemia Updated lab needed at/ before next visit.   Atrial fibrillation (HCC) Persistent refuses anticoagulation is aware of increased stroke risk  Elevated PSA Negative bx in the past , no additional pSA testing at this time per pt preference  Parent refuses immunizations Re educated and encouraged to take recommended vaccines, again declines all

## 2024-11-05 NOTE — Assessment & Plan Note (Signed)
 Persistent refuses anticoagulation is aware of increased stroke risk

## 2024-11-05 NOTE — Assessment & Plan Note (Signed)
  Patient re-educated about  the importance of commitment to a  minimum of 150 minutes of exercise per week as able.  The importance of healthy food choices with portion control discussed, as well as eating regularly and within a 12 hour window most days. The need to choose clean , green food 50 to 75% of the time is discussed, as well as to make water the primary drink and set a goal of 64 ounces water daily.       11/01/2024   11:12 AM 07/31/2024   10:36 AM 03/20/2024    1:02 PM  Weight /BMI  Weight 268 lb 0.6 oz 269 lb 1.3 oz 262 lb  Height 6' 2 (1.88 m) 6' 2 (1.88 m) 6' 2 (1.88 m)  BMI 34.41 kg/m2 34.55 kg/m2 33.64 kg/m2    unchanged

## 2024-12-25 ENCOUNTER — Telehealth: Payer: Self-pay | Admitting: Family Medicine

## 2024-12-25 ENCOUNTER — Other Ambulatory Visit: Payer: Self-pay

## 2024-12-25 MED ORDER — AMLODIPINE BESYLATE 10 MG PO TABS: 10.0000 mg | ORAL_TABLET | Freq: Every day | ORAL | 0 refills | Status: AC

## 2024-12-25 NOTE — Telephone Encounter (Signed)
 Refills sent to pharmacy.

## 2024-12-25 NOTE — Telephone Encounter (Signed)
 Prescription Request  12/25/2024  LOV: 11/01/2024  What is the name of the medication or equipment? amLODipine  (NORVASC ) 10 MG tablet [[id number]]   Have you contacted your pharmacy to request a refill? Yes   Which pharmacy would you like this sent to?   [facility]  Patient notified that their request is being sent to the clinical staff for review and that they should receive a response within 2 business days.   Please advise at walked into the office

## 2025-03-04 ENCOUNTER — Encounter: Admitting: Family Medicine

## 2025-03-07 ENCOUNTER — Encounter: Admitting: Family Medicine

## 2025-03-26 ENCOUNTER — Ambulatory Visit
